# Patient Record
Sex: Male | Born: 1970 | Race: White | Hispanic: No | Marital: Single | Smoking: Current every day smoker
Health system: Southern US, Community
[De-identification: ages and names within clinical notes are randomized; demographics above are authoritative.]

## PROBLEM LIST (undated history)

## (undated) DIAGNOSIS — F419 Anxiety disorder, unspecified: Secondary | ICD-10-CM

## (undated) DIAGNOSIS — F32A Depression, unspecified: Secondary | ICD-10-CM

## (undated) DIAGNOSIS — K219 Gastro-esophageal reflux disease without esophagitis: Secondary | ICD-10-CM

## (undated) DIAGNOSIS — F319 Bipolar disorder, unspecified: Secondary | ICD-10-CM

## (undated) DIAGNOSIS — I2510 Atherosclerotic heart disease of native coronary artery without angina pectoris: Secondary | ICD-10-CM

## (undated) DIAGNOSIS — R0600 Dyspnea, unspecified: Secondary | ICD-10-CM

## (undated) DIAGNOSIS — F329 Major depressive disorder, single episode, unspecified: Secondary | ICD-10-CM

## (undated) HISTORY — DX: Gastro-esophageal reflux disease without esophagitis: K21.9

## (undated) HISTORY — DX: Bipolar disorder, unspecified: F31.9

## (undated) HISTORY — PX: FOOT SURGERY: SHX648

## (undated) HISTORY — PX: NO PAST SURGERIES: SHX2092

---

## 1997-11-26 ENCOUNTER — Emergency Department (HOSPITAL_COMMUNITY): Admission: EM | Admit: 1997-11-26 | Discharge: 1997-11-26 | Payer: Self-pay | Admitting: Emergency Medicine

## 2000-03-23 ENCOUNTER — Encounter: Admission: RE | Admit: 2000-03-23 | Discharge: 2000-03-23 | Payer: Self-pay | Admitting: Family Medicine

## 2001-05-01 ENCOUNTER — Encounter: Payer: Self-pay | Admitting: Sports Medicine

## 2001-05-01 ENCOUNTER — Encounter: Admission: RE | Admit: 2001-05-01 | Discharge: 2001-05-01 | Payer: Self-pay | Admitting: Sports Medicine

## 2001-05-01 ENCOUNTER — Encounter: Admission: RE | Admit: 2001-05-01 | Discharge: 2001-05-01 | Payer: Self-pay | Admitting: Family Medicine

## 2001-05-03 ENCOUNTER — Encounter: Admission: RE | Admit: 2001-05-03 | Discharge: 2001-05-03 | Payer: Self-pay | Admitting: Family Medicine

## 2001-05-07 ENCOUNTER — Encounter: Admission: RE | Admit: 2001-05-07 | Discharge: 2001-05-07 | Payer: Self-pay | Admitting: Sports Medicine

## 2001-06-03 ENCOUNTER — Encounter: Payer: Self-pay | Admitting: Emergency Medicine

## 2001-06-03 ENCOUNTER — Emergency Department (HOSPITAL_COMMUNITY): Admission: EM | Admit: 2001-06-03 | Discharge: 2001-06-04 | Payer: Self-pay | Admitting: Emergency Medicine

## 2002-02-20 ENCOUNTER — Emergency Department (HOSPITAL_COMMUNITY): Admission: EM | Admit: 2002-02-20 | Discharge: 2002-02-20 | Payer: Self-pay | Admitting: *Deleted

## 2004-05-19 ENCOUNTER — Emergency Department (HOSPITAL_COMMUNITY): Admission: EM | Admit: 2004-05-19 | Discharge: 2004-05-19 | Payer: Self-pay | Admitting: Emergency Medicine

## 2006-02-13 ENCOUNTER — Emergency Department (HOSPITAL_COMMUNITY): Admission: EM | Admit: 2006-02-13 | Discharge: 2006-02-13 | Payer: Self-pay | Admitting: Family Medicine

## 2006-02-16 ENCOUNTER — Emergency Department (HOSPITAL_COMMUNITY): Admission: EM | Admit: 2006-02-16 | Discharge: 2006-02-16 | Payer: Self-pay | Admitting: Family Medicine

## 2006-08-07 ENCOUNTER — Emergency Department (HOSPITAL_COMMUNITY): Admission: EM | Admit: 2006-08-07 | Discharge: 2006-08-07 | Payer: Self-pay | Admitting: Family Medicine

## 2006-09-19 ENCOUNTER — Emergency Department (HOSPITAL_COMMUNITY): Admission: EM | Admit: 2006-09-19 | Discharge: 2006-09-19 | Payer: Self-pay | Admitting: Emergency Medicine

## 2007-01-15 ENCOUNTER — Emergency Department (HOSPITAL_COMMUNITY): Admission: EM | Admit: 2007-01-15 | Discharge: 2007-01-15 | Payer: Self-pay | Admitting: Emergency Medicine

## 2008-04-12 ENCOUNTER — Inpatient Hospital Stay (HOSPITAL_COMMUNITY): Admission: EM | Admit: 2008-04-12 | Discharge: 2008-04-17 | Payer: Self-pay | Admitting: Family Medicine

## 2009-08-07 ENCOUNTER — Emergency Department (HOSPITAL_COMMUNITY): Admission: EM | Admit: 2009-08-07 | Discharge: 2009-08-07 | Payer: Self-pay | Admitting: Family Medicine

## 2010-05-29 ENCOUNTER — Emergency Department (HOSPITAL_COMMUNITY)
Admission: EM | Admit: 2010-05-29 | Discharge: 2010-05-29 | Payer: Self-pay | Source: Home / Self Care | Admitting: Emergency Medicine

## 2010-05-29 ENCOUNTER — Inpatient Hospital Stay (HOSPITAL_COMMUNITY)
Admission: AD | Admit: 2010-05-29 | Discharge: 2010-06-01 | Payer: Self-pay | Source: Home / Self Care | Attending: Psychiatry | Admitting: Psychiatry

## 2010-08-31 LAB — CBC
HCT: 42.9 % (ref 39.0–52.0)
Hemoglobin: 14.7 g/dL (ref 13.0–17.0)
MCH: 32.2 pg (ref 26.0–34.0)
MCHC: 34.3 g/dL (ref 30.0–36.0)
MCV: 94.1 fL (ref 78.0–100.0)
Platelets: 328 K/uL (ref 150–400)
RBC: 4.56 MIL/uL (ref 4.22–5.81)
RDW: 13 % (ref 11.5–15.5)
WBC: 9.2 K/uL (ref 4.0–10.5)

## 2010-08-31 LAB — BASIC METABOLIC PANEL
BUN: 6 mg/dL (ref 6–23)
CO2: 22 mEq/L (ref 19–32)
Calcium: 9.1 mg/dL (ref 8.4–10.5)
Chloride: 109 mEq/L (ref 96–112)
Creatinine, Ser: 0.6 mg/dL (ref 0.4–1.5)
GFR calc Af Amer: >60 mL/min (ref >60)
GFR calc non Af Amer: >60 mL/min (ref >60)
Glucose, Bld: 84 mg/dL (ref 70–99)
Potassium: 4.3 mEq/L (ref 3.5–5.1)
Sodium: 140 mEq/L (ref 135–145)

## 2010-08-31 LAB — DIFFERENTIAL
Basophils Absolute: 0.1 10*3/uL (ref 0.0–0.1)
Basophils Relative: 1 % (ref 0–1)
Eosinophils Absolute: 0.3 10*3/uL (ref 0.0–0.7)
Eosinophils Relative: 3 % (ref 0–5)
Lymphocytes Relative: 27 % (ref 12–46)
Lymphs Abs: 2.5 10*3/uL (ref 0.7–4.0)
Monocytes Absolute: 0.6 10*3/uL (ref 0.1–1.0)
Monocytes Relative: 7 % (ref 3–12)
Neutro Abs: 5.8 10*3/uL (ref 1.7–7.7)
Neutrophils Relative %: 63 % (ref 43–77)

## 2010-08-31 LAB — TRICYCLICS SCREEN, URINE: TCA Scrn: NOT DETECTED

## 2010-08-31 LAB — RAPID URINE DRUG SCREEN, HOSP PERFORMED
Amphetamines: NOT DETECTED
Barbiturates: NOT DETECTED
Benzodiazepines: NOT DETECTED
Cocaine: NOT DETECTED
Opiates: NOT DETECTED
Tetrahydrocannabinol: NOT DETECTED

## 2010-08-31 LAB — ETHANOL: Alcohol, Ethyl (B): 33 mg/dL — ABNORMAL HIGH (ref 0–10)

## 2010-08-31 LAB — ACETAMINOPHEN LEVEL

## 2010-11-02 NOTE — H&P (Signed)
NAMELERRY, CORDREY NO.:  [id number]   MEDICAL RECORD NO.:  [id number]          PATIENT TYPE:  INP   LOCATION:  2921                         FACILITY:  [facility]   PHYSICIAN:  Vania Rea, M.D. DATE OF BIRTH:  [birth year]   DATE OF ADMISSION:  04/12/2008  DATE OF DISCHARGE:                              HISTORY & PHYSICAL   CHIEF COMPLAINT:  Fever, cough, and body aches of five days.   HISTORY OF PRESENT ILLNESS:  This is a 40 year old Caucasian gentleman  whose wife is recovering from flu, who has for the past five days been  having fever, cough, generalized body aches, has been self-medicating  with NyQuil and Rolaids without good effect and eventually decided to  come to the emergency room.  His wife contracted flu despite having  previously taken the flu shot and is now slowly recovering.   The patient has had chest pain only with coughing.  He has had no nausea  or vomiting.  He has had no syncope.   The patient does smoke two packs per day and does drink 6-12 beers per  day.  He also gives a history of delirium tremens if he does not drink.  The patient also reports the appearance of pustule on his left buttock  which his wife has tried to drain but she says it has recurred.  She  reports that it did not look vesicular when it first appeared.  It just  had a small head.   PAST MEDICAL HISTORY:  None.   MEDICATIONS:  Over the counter NyQuil and Rolaids.   ALLERGIES:  PENICILLIN causes hives and a rash.  He once took a pain  medication that caused severe nausea and stomach upset but he cannot  remember the name of it.   SOCIAL HISTORY:  As noted above.  He smokes two packs per day, drinks 6-  12 beers per day.  He occasionally uses marijuana.  No history of  cocaine use.  He is an unemployed Corporate investment banker.   FAMILY HISTORY:  Significant for heart disease, hypertension, and  hyperlipidemia in his father.  No other significant family medical  history.   REVIEW OF SYSTEMS:  Other than noted above, significant only for dysuria  with no frequency.   PHYSICAL EXAMINATION:  GENERAL:  Obese, young Caucasian gentleman lying  on a stretcher appears rather ill-looking, coughing frequently., is  restless.  VITAL SIGNS:  His temperature is 99.8, his pulse is 110, respirations  22, blood pressure 125/75.  He is saturating at 91% on 2 L.  He was 87%  on room air.  HEENT:  His pupils are round and equal.  Mucous membranes pink and  anicteric.  He is mildly dehydrated.  NECK:  No cervical lymphadenopathy or thyromegaly.  CHEST:  Actually sounds quite clear to auscultation.  CARDIOVASCULAR:  Tachycardic without murmurs.  ABDOMEN:  Obese, soft, and nontender.  EXTREMITIES:  Without edema.  He has 2+ bounding pulses bilaterally.  His peroneal area has 4-inch area around his right medial buttock which  is indurated with a  0.5 cm opening covered by a dry scab.  GENITOURINARY:  His penis shows no urethral discharge and no evidence of  urethral inflammation.  CENTRAL NERVOUS SYSTEM:  Cranial nerves II-XII are grossly intact and he  has no focal neurologic deficits.   LABORATORY DATA:  His white count is 14.4, hemoglobin 15.2, platelets  243.  His absolute neutrophil count is 11.7.  Serum chemistry:  Sodium  is 131, potassium 3.7, chloride 94, CO2 32, BUN 4, creatinine 0.9, his  glucose is 119.   His chest x-ray shows patchy bilateral air space disease.   ASSESSMENT:  1. Viral syndrome.  2. Bilateral pneumonia, likely complication of the viral syndrome.  3. Right buttock abscess.  4. Tobacco abuse/addiction.  5. Chronic alcoholism.  6. Acute respiratory failure.   PLAN:  1. Will admit this gentleman to start on IV fluids, oxygen      supplementation, antibiotics, and will cover him with vancomycin      since he does have a past history of MRSA.  Will consult surgery      for assistance with right buttock abscess.  2. Nicotine patch  for his nicotine addiction.  He has been counseled      on the dangers of nicotine abuse, and we will put him on DT      prevention.  Other plans as per orders.      Vania Rea, M.D.  Electronically Signed     LC/MEDQ  D:  04/12/2008  T:  04/12/2008  Job:  [id number]

## 2010-11-02 NOTE — Consult Note (Signed)
Dustin Olsen, Dustin Olsen                  ACCOUNT NO.:  [id number]   MEDICAL RECORD NO.:  [id number]          PATIENT TYPE:  INP   LOCATION:  1826                         FACILITY:  [facility]   PHYSICIAN:  Clovis Pu. Olsen, M.D.DATE OF BIRTH:  1970/11/17   DATE OF CONSULTATION:  DATE OF DISCHARGE:                                 CONSULTATION   REFERRING PHYSICIAN:  Dr. Orvan Falconer from [facility].   CHIEF COMPLAINT:  Right buttock redness, pain, and swelling.   HISTORY OF PRESENT ILLNESS:  The patient is a 40 year old male with a 1-  week history of pain in his right buttock.  According to his significant  other who is with him tonight, she stated that his right buttock has  been hurting for about a week.  It has been red, it became more swollen,  and then began to drain.  The drain has been pretty continuous.  He has  also developed cough, headache, and according to his girlfriend, fever  and chills over the last week.  Chest x-ray revealed bilateral airspace  opacities, and there is concern that he may have pneumonia with flu-like  symptoms.  He is being admitted due to what appears to be hypoxemia on  his blood gas and pneumonia and I was asked to see the patient at the  request of Dr. Orvan Falconer in consultation for the right buttock swelling  and pain.   PAST MEDICAL HISTORY:  1. Tobacco abuse, he smokes 2-pack cigarettes a day.  2. Chronic back pain.   PAST SURGICAL HISTORY:  None.   SOCIAL HISTORY:  Does smoke 2-pack cigarettes a day.  Denies alcohol  use.   FAMILY HISTORY:  Noncontributory.   MEDICATIONS:  NyQuil and Rolaids.   REVIEW OF SYSTEMS:  Seven systems reviewed, otherwise negative beyond  that stated above.   PHYSICAL EXAMINATION:  VITAL SIGNS:  Temperature 98, heart rate 110,  blood pressure 125/75, and O2 saturation to 94%.  HEENT:  There is no evidence of scleral icterus.  Normocephalic and  atraumatic.  EXTREMITIES:  Muscle tone is normal.  No evidence of edema.   Right  buttock shows an open area measuring a centimeter over the mid right  buttock with a surrounding rim of erythema and fluctuance.  It is  tender.   DIAGNOSTIC STUDIES:  White count of 14,000, hemoglobin 15.2, and  platelet count is 243,000.  Sodium 131, potassium 3.7, chloride 94, BUN  4, and creatinine 0.9.  Blood gas 7.46, PCO2 of 40, O2 68, and 95%  saturation.  Chest x-ray shows bilateral airspace opacities.   IMPRESSION:  1. Right buttock abscess, partially drained.  2. Probable pneumonia.  3. Flu-like symptoms.   PLAN:  This needs to be opened, and certainly, it can be done here at  the bedside.  It is already partially open and I talked to the patient  about this, and he agreed to proceed with incision and drainage at the  bedside of right buttock abscess.  Under sterile condition, the right  buttock was prepped in sterile fashion.  I used 1% lidocaine  for  anesthesia.  The previous opening was in use and I placed a hemostat and  dilated this up and got a considerable amount of pus out.  I then packed  the wound with quarter-inch iodoform packing.  Cultures were taken.  He  tolerated the procedure well.  We will follow him while in the hospital.      Dustin Olsen, M.D.  Electronically Signed     TAC/MEDQ  D:  04/12/2008  T:  04/13/2008  Job:  [id number]

## 2010-11-05 NOTE — Discharge Summary (Signed)
Dustin Olsen, Dustin Olsen NO.:  [id number]   MEDICAL RECORD NO.:  [id number]          PATIENT TYPE:  INP   LOCATION:  3003                         FACILITY:  [facility]   PHYSICIAN:  Marcellus Scott, MD     DATE OF BIRTH:  March 15, 1971   DATE OF ADMISSION:  04/12/2008  DATE OF DISCHARGE:  04/17/2008                               DISCHARGE SUMMARY   PRIMARY MEDICAL DOCTOR:  At [facility].   DISCHARGE DIAGNOSES:  1. Methicillin-resistant Staphylococcus aureus gluteal abscess.  2. Bilateral pneumonia.  3. Acute respiratory failure - resolved.  4. Tobacco abuse.  5. Alcoholism.  6. Flu-like illness.   DISCHARGE MEDICATIONS:  1. Doxycycline 100 mg p.o. b.i.d. for 10 days.  2. Nicotine 21 mg per 24-hour patch, 1 daily for 5 weeks, then taper.  3. Thiamine 100 mg p.o. daily.  4. Folate 1 mg p.o. daily.  5. Multivitamins 1 p.o. daily.  6. Oxycodone 5 mg p.o. q.4-6 hourly p.r.n.   PROCEDURES:  1. Chest x-ray on April 12, 2008.  Impression:  Cardiomegaly.      Patchy bilateral opacities peripherally in the lungs.  In the      patient with cough, this probably represents infection.  Empiric      treatment with followup chest x-ray was recommended by radiologist.      If no improvement was observed, then recommend a chest CT.  They      also indicated that interstitial lung disease could produce this      appearance.   PERTINENT LABORATORIES:  Hemoglobin A1c 5.9.  Wound culture with  moderate methicillin-resistant Staphylococcus aureus, which was  sensitive to clindamycin, gentamicin, levofloxacin, rifampin, Bactrim,  vancomycin, and tetracycline.  They was resistant to erythromycin,  oxacillin, and penicillin.   Basic metabolic panel unremarkable with BUN 2 and creatinine 0.74.  CBCs  with hemoglobin 13.2, hematocrit 38, white blood cell 9.1, and platelets  501.  ABG on April 13, 2008, with pH 7.471, pCO2 of 37.2, pO2 of 75.3,  and oxygen saturation 95.7.   Hepatic panel only remarkable for AST of  54, ALT of 45, and albumin of 2.9.  ABG on April 12, 2008, had pH of  7.47, pCO2 of 41, pO2 of 68, bicarb 30, and oxygen saturation 95.  BNP  of 104.   CONSULTATIONS:  Surgery, Thomas A. Cornett, MD   HOSPITAL COURSE AND PATIENT DISPOSITION:  Mr. Luckadoo is a 40 year old  Caucasian male patient whose wife was recovering from the flu, who for  the 5 days prior to admission, was having fever, cough, generalized body  aches, and had been self-medicating himself with NyQuil and Rolaids with  good effect and eventually decided to come to the ED.  The patient had  some chest pain only on coughing.  He was an ongoing smoker and drank 6-  12 beers per day.  He also gave history of withdrawal if he did not  drink.  He reported a pustule on his left buttock, which his wife had  tried to drain, but it had recurred.  In  the emergency room, the patient  was ill-looking with hacking cough and low-grade temperature of 99.8,  tachycardic at 110 per minute, tachypneic at 22 per minute, saturating  91% on 2 L.  He was 87% on room air.  He was found to have white blood  cell of 14.4.  Chest x-ray suggestive of bilateral airspace disease.  He  was then admitted as a viral syndrome, bilateral pneumonia, and right  buttock abscess.   1. MRSA gluteal abscess.  The patient was admitted to the hospital.      Surgery was consulted.  They proceeded to incise and drain the      right buttock abscess.  He was empirically placed on IV vancomycin      and Levaquin.  Subsequently, the pain in the right buttock area was      improving.  Daily dressings were continued.  Subsequently, the      surgeons adviced that he could be discharged home to complete 7- to      10-day course of antibiotics and wound care and followup with the      surgeons in 7-10 days upon discharge.  2. Bilateral pneumonia.  The patient was started on Levaquin.  His      acute transient hypoxic respiratory  failure improved.  He completed      the course of Levaquin.  The patient was placed on droplet and      contact isolation.  3. Acute respiratory failure - resolved.  4. Tobacco abuse - the patient was counseled regarding cessation and      was discharged on a nicotine patch.  5. Alcoholism.  The patient was placed on withdrawal protocol.  He did      not demonstrate any overt signs of withdrawal.  Librium was      subsequently discontinued and the patient was discharged on      multivitamins.  6. Flu-like illness - was treated and resolved.      Marcellus Scott, MD  Electronically Signed     AH/MEDQ  D:  05/29/2008  T:  05/29/2008  Job:  [id number]   cc:   Maisie Fus A. Cornett, M.D.  [facility]

## 2011-03-21 LAB — POCT I-STAT, CHEM 8
BUN: 4 — ABNORMAL LOW
Calcium, Ion: 0.99 — ABNORMAL LOW
Chloride: 94 — ABNORMAL LOW
Creatinine, Ser: 0.9
Glucose, Bld: 119 — ABNORMAL HIGH
HCT: 46
Hemoglobin: 15.6
Potassium: 3.7
Sodium: 131 — ABNORMAL LOW
TCO2: 32

## 2011-03-21 LAB — CBC
HCT: 38 — ABNORMAL LOW
HCT: 39.1
HCT: 39.8
HCT: 45.9
Hemoglobin: 13.2
Hemoglobin: 13.4
Hemoglobin: 13.7
Hemoglobin: 15.2
MCHC: 33.2
MCHC: 33.7
MCHC: 34.7
MCHC: 35
MCV: 90.8
MCV: 92.2
MCV: 93.4
MCV: 93.5
Platelets: 243
Platelets: 288
Platelets: 355
Platelets: 501 — ABNORMAL HIGH
RBC: 4.12 — ABNORMAL LOW
RBC: 4.25
RBC: 4.31
RBC: 4.91
RDW: 13.5
RDW: 13.6
RDW: 13.6
RDW: 13.8
WBC: 11.3 — ABNORMAL HIGH
WBC: 13.8 — ABNORMAL HIGH
WBC: 14.4 — ABNORMAL HIGH
WBC: 9.1

## 2011-03-21 LAB — POCT I-STAT 3, ART BLOOD GAS (G3+)
Acid-Base Excess: 5 — ABNORMAL HIGH
Bicarbonate: 29.6 — ABNORMAL HIGH
O2 Saturation: 95
Patient temperature: 98.1
TCO2: 31
pCO2 arterial: 40.8
pH, Arterial: 7.468 — ABNORMAL HIGH
pO2, Arterial: 68 — ABNORMAL LOW

## 2011-03-21 LAB — BASIC METABOLIC PANEL
BUN: 2 — ABNORMAL LOW
BUN: 2 — ABNORMAL LOW
BUN: 3 — ABNORMAL LOW
CO2: 27
CO2: 28
CO2: 28
Calcium: 7.9 — ABNORMAL LOW
Calcium: 8.1 — ABNORMAL LOW
Calcium: 8.1 — ABNORMAL LOW
Chloride: 100
Chloride: 104
Chloride: 104
Creatinine, Ser: 0.65
Creatinine, Ser: 0.68
Creatinine, Ser: 0.74
GFR calc Af Amer: >60
GFR calc Af Amer: >60
GFR calc Af Amer: >60
GFR calc non Af Amer: >60
GFR calc non Af Amer: >60
GFR calc non Af Amer: >60
Glucose, Bld: 104 — ABNORMAL HIGH
Glucose, Bld: 110 — ABNORMAL HIGH
Glucose, Bld: 125 — ABNORMAL HIGH
Potassium: 3.4 — ABNORMAL LOW
Potassium: 3.5
Potassium: 4
Sodium: 135
Sodium: 137
Sodium: 138

## 2011-03-21 LAB — HEPATIC FUNCTION PANEL
ALT: 45
AST: 54 — ABNORMAL HIGH
Albumin: 2.9 — ABNORMAL LOW
Alkaline Phosphatase: 67
Bilirubin, Direct: 0.3
Indirect Bilirubin: 0.6
Total Bilirubin: 0.9
Total Protein: 6.5

## 2011-03-21 LAB — BLOOD GAS, ARTERIAL
Acid-Base Excess: 3.2 — ABNORMAL HIGH
Bicarbonate: 26.6 — ABNORMAL HIGH
Drawn by: 251151
O2 Content: 4
O2 Saturation: 95.7
Patient temperature: 99.8
TCO2: 27.7
pCO2 arterial: 37.2
pH, Arterial: 7.471 — ABNORMAL HIGH
pO2, Arterial: 75.3 — ABNORMAL LOW

## 2011-03-21 LAB — GLUCOSE, CAPILLARY
Glucose-Capillary: 118 — ABNORMAL HIGH
Glucose-Capillary: 246 — ABNORMAL HIGH

## 2011-03-21 LAB — DIFFERENTIAL
Basophils Absolute: 0
Basophils Relative: 0
Eosinophils Absolute: 0
Eosinophils Relative: 0
Lymphocytes Relative: 8 — ABNORMAL LOW
Lymphs Abs: 1.1
Monocytes Absolute: 1.6 — ABNORMAL HIGH
Monocytes Relative: 11
Neutro Abs: 11.7 — ABNORMAL HIGH
Neutrophils Relative %: 81 — ABNORMAL HIGH

## 2011-03-21 LAB — HEMOGLOBIN A1C
Hgb A1c MFr Bld: 5.9
Mean Plasma Glucose: 123

## 2011-03-21 LAB — WOUND CULTURE

## 2011-03-21 LAB — B-NATRIURETIC PEPTIDE (CONVERTED LAB): Pro B Natriuretic peptide (BNP): 104 — ABNORMAL HIGH

## 2011-03-21 LAB — MAGNESIUM: Magnesium: 2.6 — ABNORMAL HIGH

## 2011-03-21 LAB — VANCOMYCIN, TROUGH: Vancomycin Tr: 15.8

## 2011-04-04 LAB — POCT URINALYSIS DIP (DEVICE)
Glucose, UA: NEGATIVE
Hgb urine dipstick: NEGATIVE
Ketones, ur: NEGATIVE
Nitrite: NEGATIVE
Operator id: 239701
Protein, ur: NEGATIVE
Specific Gravity, Urine: >=1.03
Urobilinogen, UA: 0.2
pH: 5.5

## 2011-12-19 ENCOUNTER — Emergency Department (INDEPENDENT_AMBULATORY_CARE_PROVIDER_SITE_OTHER)
Admission: EM | Admit: 2011-12-19 | Discharge: 2011-12-19 | Disposition: A | Discharge status: Home / Self Care | Payer: Self-pay | Source: Home / Self Care | Attending: Family Medicine | Admitting: Family Medicine

## 2011-12-19 ENCOUNTER — Encounter (HOSPITAL_COMMUNITY): Payer: Self-pay

## 2011-12-19 DIAGNOSIS — K089 Disorder of teeth and supporting structures, unspecified: Secondary | ICD-10-CM

## 2011-12-19 DIAGNOSIS — K047 Periapical abscess without sinus: Secondary | ICD-10-CM

## 2011-12-19 DIAGNOSIS — K0889 Other specified disorders of teeth and supporting structures: Secondary | ICD-10-CM

## 2011-12-19 MED ORDER — IBUPROFEN 600 MG PO TABS: 600.0000 mg | ORAL_TABLET | Freq: Three times a day (TID) | ORAL | Status: AC | PRN

## 2011-12-19 MED ORDER — HYDROCODONE-ACETAMINOPHEN 5-500 MG PO TABS: 1.0000 | ORAL_TABLET | Freq: Three times a day (TID) | ORAL | Status: AC | PRN

## 2011-12-19 MED ORDER — CLINDAMYCIN HCL 300 MG PO CAPS: 300.0000 mg | ORAL_CAPSULE | Freq: Three times a day (TID) | ORAL | Status: AC

## 2011-12-19 NOTE — Discharge Instructions (Signed)
You need to see a dentist for definitive treatment. Take the prescribed medications as instructed. Be aware that Vicodin can make you drowsy and he should not drive after taking this medication. Call the dentist on call number provided above or see the list below to find a dentist.    Go to www.goodrx.com to look up your medications. This will give you a list of where you can find your prescriptions at the most affordable prices.   RESOURCE GUIDE  Dental Problems  Look up DebtSupply.pl.asp for a schedule of the [facility]'s free dental clinics called [address] of [place]. They have clinics all around [place]. Get there early and be prepared to wait.   [facility] [address]  [place], [place] [address] [phone]  [facility] [address] [place], [place] [phone]  Patients with Medicaid: [facility] [phone] [address]                                [phone] [address] Phone:  [phone]                                                  Phone:  [phone]  If unable to pay or uninsured, contact:  [facility] or [facility]. to become qualified for the adult dental clinic.

## 2011-12-19 NOTE — ED Notes (Signed)
C/o dental pain-states there are 2 teeth on the rt upper.  Sx for 4 days.  Taking ibuprofen without relief.  Doesn't have a Education officer, community.

## 2011-12-21 NOTE — ED Provider Notes (Signed)
History     CSN: [id number]  Arrival date & time 12/19/11  1208   First MD Initiated Contact with Patient 12/19/11 1319      Chief Complaint  Patient presents with  . Dental Pain    (Consider location/radiation/quality/duration/timing/severity/associated sxs/prior treatment) HPI Comments: 41 y/o smoker, non diabetic male. Here c/o dental pain and gum swelling for 4 days at right upper maxilla. Denies fever or chills, no face swelling.    History reviewed. No pertinent past medical history.  History reviewed. No pertinent past surgical history.  No family history on file.  History  Substance Use Topics  . Smoking status: Current Everyday Smoker -- 1.0 packs/day  . Smokeless tobacco: Not on file  . Alcohol Use: Yes      Review of Systems  Constitutional: Negative for fever and chills.       10 systems reviewed and  pertinent negative and positive symptoms are as per HPI.     HENT: Positive for dental problem. Negative for ear pain, congestion, facial swelling, rhinorrhea, neck pain and sinus pressure.   Respiratory: Negative for shortness of breath.   Cardiovascular: Negative for chest pain, palpitations and leg swelling.  Gastrointestinal: Negative for nausea, vomiting and abdominal pain.  Skin: Negative for rash.  Neurological: Negative for dizziness and headaches.  All other systems reviewed and are negative.    Allergies  Penicillins  Home Medications   Current Outpatient Rx  Name Route Sig Dispense Refill  . CLINDAMYCIN HCL 300 MG PO CAPS Oral Take 1 capsule (300 mg total) by mouth 3 (three) times daily. 21 capsule 0  . HYDROCODONE-ACETAMINOPHEN 5-500 MG PO TABS Oral Take 1 tablet by mouth every 8 (eight) hours as needed for pain. 10 tablet 0  . IBUPROFEN 600 MG PO TABS Oral Take 1 tablet (600 mg total) by mouth every 8 (eight) hours as needed for pain. 20 tablet 0    BP 138/86  Pulse 80  Temp 98.5 F (36.9 C) (Oral)  Resp 18  SpO2 99%  Physical  Exam  Nursing note and vitals reviewed. Constitutional: He is oriented to person, place, and time. He appears well-developed and well-nourished. No distress.  HENT:  Head: Normocephalic and atraumatic.  Right Ear: External ear normal.  Left Ear: External ear normal.  Nose: Nose normal.  Mouth/Throat: Oropharynx is clear and moist. No oropharyngeal exudate.       Poor dentition with extensive dental plaque and periodontal disease.   Several fractured pieces. Tenderness to palpation and periapical swelling and yellow exudate around right upper molars.   Cardiovascular: Normal heart sounds.   No murmur heard. Pulmonary/Chest: Breath sounds normal.  Neurological: He is alert and oriented to person, place, and time.  Skin: No rash noted.    ED Course  Procedures (including critical care time)  Labs Reviewed - No data to display No results found.   1. Pain, dental   2. Dental abscess       MDM  Allergic to penicillin, treated with clindamycin, Vicodin and ibuprofen. Dental referral provided.         Sharin Grave, MD 12/21/11 1128

## 2012-04-25 ENCOUNTER — Encounter (HOSPITAL_COMMUNITY): Payer: Self-pay | Admitting: Emergency Medicine

## 2012-04-25 ENCOUNTER — Emergency Department (HOSPITAL_COMMUNITY): Payer: Self-pay

## 2012-04-25 ENCOUNTER — Emergency Department (INDEPENDENT_AMBULATORY_CARE_PROVIDER_SITE_OTHER): Payer: Self-pay

## 2012-04-25 ENCOUNTER — Emergency Department (INDEPENDENT_AMBULATORY_CARE_PROVIDER_SITE_OTHER)
Admission: EM | Admit: 2012-04-25 | Discharge: 2012-04-25 | Disposition: A | Discharge status: Nursing Home (Includes ICF) | Payer: Self-pay | Source: Home / Self Care | Attending: Emergency Medicine | Admitting: Emergency Medicine

## 2012-04-25 ENCOUNTER — Encounter (HOSPITAL_COMMUNITY): Payer: Self-pay

## 2012-04-25 ENCOUNTER — Emergency Department (HOSPITAL_COMMUNITY)
Admission: EM | Admit: 2012-04-25 | Discharge: 2012-04-25 | Disposition: A | Discharge status: Home / Self Care | Payer: Self-pay | Attending: Emergency Medicine | Admitting: Emergency Medicine

## 2012-04-25 DIAGNOSIS — Z8659 Personal history of other mental and behavioral disorders: Secondary | ICD-10-CM | POA: Insufficient documentation

## 2012-04-25 DIAGNOSIS — R0789 Other chest pain: Secondary | ICD-10-CM | POA: Insufficient documentation

## 2012-04-25 DIAGNOSIS — Z7982 Long term (current) use of aspirin: Secondary | ICD-10-CM | POA: Insufficient documentation

## 2012-04-25 DIAGNOSIS — R748 Abnormal levels of other serum enzymes: Secondary | ICD-10-CM | POA: Insufficient documentation

## 2012-04-25 DIAGNOSIS — R079 Chest pain, unspecified: Secondary | ICD-10-CM

## 2012-04-25 DIAGNOSIS — F17200 Nicotine dependence, unspecified, uncomplicated: Secondary | ICD-10-CM | POA: Insufficient documentation

## 2012-04-25 DIAGNOSIS — K219 Gastro-esophageal reflux disease without esophagitis: Secondary | ICD-10-CM | POA: Insufficient documentation

## 2012-04-25 HISTORY — DX: Major depressive disorder, single episode, unspecified: F32.9

## 2012-04-25 HISTORY — DX: Depression, unspecified: F32.A

## 2012-04-25 LAB — COMPREHENSIVE METABOLIC PANEL
ALT: 318 U/L — ABNORMAL HIGH (ref 0–53)
AST: 247 U/L — ABNORMAL HIGH (ref 0–37)
Albumin: 4 g/dL (ref 3.5–5.2)
Alkaline Phosphatase: 96 U/L (ref 39–117)
BUN: 6 mg/dL (ref 6–23)
CO2: 24 mEq/L (ref 19–32)
Calcium: 9.5 mg/dL (ref 8.4–10.5)
Chloride: 97 mEq/L (ref 96–112)
Creatinine, Ser: 0.61 mg/dL (ref 0.50–1.35)
GFR calc Af Amer: >90 mL/min (ref >90)
GFR calc non Af Amer: >90 mL/min (ref >90)
Glucose, Bld: 102 mg/dL — ABNORMAL HIGH (ref 70–99)
Potassium: 4.2 mEq/L (ref 3.5–5.1)
Sodium: 134 mEq/L — ABNORMAL LOW (ref 135–145)
Total Bilirubin: 1 mg/dL (ref 0.3–1.2)
Total Protein: 7.4 g/dL (ref 6.0–8.3)

## 2012-04-25 LAB — POCT I-STAT TROPONIN I: Troponin i, poc: 0.01 ng/mL (ref 0.00–0.08)

## 2012-04-25 LAB — APTT: aPTT: 25 seconds (ref 24–37)

## 2012-04-25 LAB — CBC
HCT: 45.4 % (ref 39.0–52.0)
Hemoglobin: 15.9 g/dL (ref 13.0–17.0)
MCH: 32.9 pg (ref 26.0–34.0)
MCHC: 35 g/dL (ref 30.0–36.0)
MCV: 94 fL (ref 78.0–100.0)
Platelets: 150 10*3/uL (ref 150–400)
RBC: 4.83 MIL/uL (ref 4.22–5.81)
RDW: 13.1 % (ref 11.5–15.5)
WBC: 6.7 10*3/uL (ref 4.0–10.5)

## 2012-04-25 LAB — PROTIME-INR
INR: 1.08 (ref 0.00–1.49)
Prothrombin Time: 13.9 seconds (ref 11.6–15.2)

## 2012-04-25 MED ORDER — TRAMADOL HCL 50 MG PO TABS: 50.0000 mg | ORAL_TABLET | Freq: Four times a day (QID) | ORAL | Status: DC | PRN

## 2012-04-25 MED ORDER — OMEPRAZOLE 20 MG PO CPDR: 20.0000 mg | DELAYED_RELEASE_CAPSULE | Freq: Every day | ORAL | Status: DC

## 2012-04-25 MED ORDER — ASPIRIN 81 MG PO CHEW
324.0000 mg | CHEWABLE_TABLET | Freq: Once | ORAL | Status: AC
  Administered 2012-04-25: 324 mg via ORAL
  Filled 2012-04-25: qty 4

## 2012-04-25 MED ORDER — SODIUM CHLORIDE 0.9 % IV SOLN
1000.0000 mL | INTRAVENOUS | Status: DC
  Administered 2012-04-25: 1000 mL via INTRAVENOUS

## 2012-04-25 MED ORDER — HYDROCODONE-ACETAMINOPHEN 5-325 MG PO TABS: 1.0000 | ORAL_TABLET | Freq: Four times a day (QID) | ORAL | Status: DC | PRN

## 2012-04-25 NOTE — ED Notes (Signed)
Was reaching for hammer in back of truck on Friday and hurt left side went to ucc this am and has rib fx

## 2012-04-25 NOTE — ED Notes (Addendum)
Patient c/o chest pain, states no nausea, no arm or jaw pain (states does have bad teeth and pain from teeth) , complains of chest pressure, no radiation, also complains of rib pain states that sx began Sunday

## 2012-04-25 NOTE — ED Provider Notes (Signed)
History     CSN: [id number]  Arrival date & time 04/25/12  4098   First MD Initiated Contact with Patient 04/25/12 [phone]      Chief Complaint  Patient presents with  . Chest Pain    (Consider location/radiation/quality/duration/timing/severity/associated sxs/prior treatment) HPI Comments: Patient presents urgent care complaining of chest pain which she describes different time frames is been having " intermittent substernal chest pains for the last 2 weeks", he then fell from his truck and describes that he thinks he cracked a couple of ribs on the left side of his chest that have been worsening in the last week, breathing and moving makes the pain worse. The patient further describes that he was having pain since (points to precordial and substernal region), prior to the fall. Has been feeling weak at times, and also feels that he might have bronchitis is having coughing and because of his pain. Patient denies any shortness of breath, no diaphoresis. He has not taken any medicines for his ongoing chest pains.   Patient is a 41 y.o. male presenting with chest pain. The history is provided by the patient.  Chest Pain The chest pain began more than 2 weeks ago. Chest pain occurs constantly. The chest pain is unchanged. The pain is associated with breathing, coughing and exertion. At its most intense, the pain is at 8/10. The pain is currently at 5/10. The quality of the pain is described as sharp and pressure-like. Primary symptoms include cough. Pertinent negatives for primary symptoms include no fever, no fatigue, no shortness of breath, no wheezing, no palpitations, no abdominal pain, no nausea, no dizziness and no altered mental status.  Associated symptoms include weakness.  Pertinent negatives for associated symptoms include no diaphoresis and no lower extremity edema. He tried nothing for the symptoms. There are no known risk factors.     History reviewed. No pertinent past medical  history.  History reviewed. No pertinent past surgical history.  No family history on file.  History  Substance Use Topics  . Smoking status: Current Every Day Smoker -- 1.0 packs/day  . Smokeless tobacco: Not on file  . Alcohol Use: Yes      Review of Systems  Constitutional: Positive for activity change. Negative for fever, chills, diaphoresis, appetite change and fatigue.  Respiratory: Positive for cough. Negative for shortness of breath and wheezing.   Cardiovascular: Positive for chest pain. Negative for palpitations and leg swelling.  Gastrointestinal: Negative for nausea and abdominal pain.  Musculoskeletal: Negative for back pain.  Skin: Negative for rash.  Neurological: Positive for weakness. Negative for dizziness and headaches.  Psychiatric/Behavioral: Negative for altered mental status.    Allergies  Penicillins  Home Medications  No current outpatient prescriptions on file.  BP 143/89  Pulse 81  Temp 98.7 F (37.1 C) (Oral)  Resp 18  SpO2 100%  Physical Exam  Nursing note and vitals reviewed. Constitutional: Vital signs are normal. He appears well-developed and well-nourished.  Non-toxic appearance. He does not have a sickly appearance. He does not appear ill. No distress.  HENT:  Head: Normocephalic.  Neck: Neck supple. No JVD present.  Cardiovascular: Normal rate.  Exam reveals no gallop and no friction rub.   No murmur heard. Pulmonary/Chest: Effort normal and breath sounds normal. No respiratory distress. He has no decreased breath sounds. He has no wheezes. He has no rales. He exhibits tenderness.  Abdominal: Soft.  Musculoskeletal: Normal range of motion.  Lymphadenopathy:    He has no  cervical adenopathy.  Neurological: He is alert.  Skin: No rash noted. No erythema.    ED Course  Procedures (including critical care time)  Labs Reviewed - No data to display Dg Chest 2 View  04/25/2012  *RADIOLOGY REPORT*  Clinical Data: Chest pain.   CHEST - 2 VIEW  Comparison: April 12, 2008.  Findings: Cardiomediastinal silhouette appears normal.  No acute pulmonary disease is noted.  Bony thorax is intact.  IMPRESSION: No acute cardiopulmonary abnormality seen.   Original Report Authenticated By: Lupita Raider.,  M.D.    Dg Ribs Unilateral Left  04/25/2012  *RADIOLOGY REPORT*  Clinical Data: Injured last Friday, left anterior lower rib pain  LEFT RIBS - 2 VIEW  Comparison: None.  Findings: On one-view, there does appear to be a fracture of the anterior left seventh rib.  No other acute abnormality is seen.  No effusion is noted, and there is no evidence of pneumothorax on the images obtained.  IMPRESSION: Fracture of the anterior left seventh rib.   Original Report Authenticated By: Dwyane Dee, M.D.      1. Chest pain       MDM  Patient with atypical- substernal CP and and reporting a recent fall. Coexistent recent fall but also patient reports having chest pains prior to this fall. Initial EKG at urgent care revealed no ST T. changes to suggest an acute ischemia. Poor historian. We'll transfer patient to the emergency department to be consider for a single set of cardiac markers. Patient is hemodynamically stable, had x-rays and rib detail x-rays that revealed no obvious rib fractures.          Jimmie Molly, MD 04/25/12 [phone]

## 2012-04-25 NOTE — ED Notes (Addendum)
Pt reports last week on Wednesday he started having CP, reports he has had a cough x 2 weeks. CP at central chest, comes and goes but sometimes its constant. Pt reports he was coughing a lot on Sunday and the pain became worse. Pt reports he was on his truck when he fell and hurt his chest even worse.

## 2012-04-25 NOTE — ED Provider Notes (Addendum)
History   This chart was scribed for Celene Kras, MD by Albertha Ghee Rifaie. This patient was seen in room TR11C/TR11C and the patient's care was started at 12:52 PM.   CSN: [id number]  Arrival date & time 04/25/12  1034   First MD Initiated Contact with Patient 04/25/12 1128      No chief complaint on file.    The history is provided by the patient. No language interpreter was used.    Dustin Olsen is a 41 y.o. male who presents to the Emergency Department complaining of sternal chest pain onset 3 days ago and persistent cough onset 2 weeks ago. Patient describes pain as constant, gradually worsening, moderate, and pressure-like.  Pain is aggravated or relieved by nothing. There is associated mild SOB, and mild headache. He also complains of sharp left rib pain. He denies swelling, nausea, fever, acid reflux or chills. He denies any other chronic health problems. Patient has been taking BCs powder for pain with minimal relief.  He was referred by Dr. Ladon Applebaum from Urgent care for further cardiac testing.   Patient reports no relevant personal cardiac history or surgical history. Patient has a family history of MI ( father x3).  Patient is a current everyday smoker and he states some alcohol use.   Past Medical History  Diagnosis Date  . Depression    History  Substance Use Topics  . Smoking status: Current Every Day Smoker -- 1.0 packs/day  . Smokeless tobacco: Not on file  . Alcohol Use: Yes      Review of Systems A complete 10 system review of systems was obtained and all systems are negative except as noted in the HPI and PMH.    Allergies  Penicillins  Home Medications   Current Outpatient Rx  Name  Route  Sig  Dispense  Refill  . ASPIRIN 325 MG PO TABS   Oral   Take 325-650 mg by mouth daily as needed. As needed for toothaches.         . BC HEADACHE POWDER PO   Oral   Take 1 Package by mouth 2 (two) times daily as needed. As needed for toothache.            BP 153/95  Pulse 72  Temp 97.9 F (36.6 C)  Resp 16  SpO2 97%  Physical Exam  Nursing note and vitals reviewed. Constitutional: He appears well-developed and well-nourished. No distress.  HENT:  Head: Normocephalic and atraumatic.  Right Ear: External ear normal.  Left Ear: External ear normal.       Widespread dental caries.  No swelling or erythema.  Eyes: Conjunctivae normal are normal. Right eye exhibits no discharge. Left eye exhibits no discharge. No scleral icterus.  Neck: Neck supple. No tracheal deviation present.  Cardiovascular: Normal rate, regular rhythm and intact distal pulses.   Pulmonary/Chest: Effort normal and breath sounds normal. No stridor. No respiratory distress. He has no wheezes. He has no rales. He exhibits tenderness.       Mild tenderness to palpation of chest wall.   Abdominal: Soft. Bowel sounds are normal. He exhibits no distension. There is no tenderness. There is no rebound and no guarding.  Musculoskeletal: He exhibits no edema and no tenderness.  Neurological: He is alert. He has normal strength. No sensory deficit. Cranial nerve deficit:  no gross defecits noted. He exhibits normal muscle tone. He displays no seizure activity. Coordination normal.  Skin: Skin is warm and dry. No  rash noted.  Psychiatric: He has a normal mood and affect.    ED Course  Procedures (including critical care time)  DIAGNOSTIC STUDIES: Oxygen Saturation is 100% on room air, normal by my interpretation.    COORDINATION OF CARE: 12:49 PM Discussed treatment plan with pt at bedside and pt agreed to plan.    Rate: 70  Rhythm: normal sinus rhythm  QRS Axis: normal  Intervals: normal  ST/T Wave abnormalities: normal  Conduction Disutrbances:none  Narrative Interpretation:   Old EKG Reviewed: none available  Labs Reviewed  COMPREHENSIVE METABOLIC PANEL - Abnormal; Notable for the following:    Sodium 134 (*)     Glucose, Bld 102 (*)     AST 247 (*)      ALT 318 (*)     All other components within normal limits  CBC  PROTIME-INR  APTT  POCT I-STAT TROPONIN I   Dg Chest 2 View  04/25/2012  *RADIOLOGY REPORT*  Clinical Data: Chest pain.  CHEST - 2 VIEW  Comparison: April 12, 2008.  Findings: Cardiomediastinal silhouette appears normal.  No acute pulmonary disease is noted.  Bony thorax is intact.  IMPRESSION: No acute cardiopulmonary abnormality seen.   Original Report Authenticated By: Lupita Raider.,  M.D.    Dg Ribs Unilateral Left  04/25/2012  *RADIOLOGY REPORT*  Clinical Data: Injured last Friday, left anterior lower rib pain  LEFT RIBS - 2 VIEW  Comparison: None.  Findings: On one-view, there does appear to be a fracture of the anterior left seventh rib.  No other acute abnormality is seen.  No effusion is noted, and there is no evidence of pneumothorax on the images obtained.  IMPRESSION: Fracture of the anterior left seventh rib.   Original Report Authenticated By: Dwyane Dee, M.D.    US Abdomen Complete  04/25/2012  *RADIOLOGY REPORT*  Clinical Data:  Gallbladder disease.  Chest pain.  Rib fractures.  COMPLETE ABDOMINAL ULTRASOUND  Comparison:  None.  Findings:  Gallbladder:  No gallstones, gallbladder wall thickening, or pericholecystic fluid.  Common bile duct:  3 mm, normal.  Liver:  No focal lesion identified.  Within normal limits in parenchymal echogenicity.  IVC:  Appears normal.  Pancreas:  Obscured by overlying bowel gas.  Spleen:  8.7 cm.  No perisplenic fluid.  Right Kidney:  11.2 cm. Normal echotexture.  Normal central sinus echo complex.  No calculi or hydronephrosis.  Left Kidney:  11.7 cm. Normal echotexture.  Normal central sinus echo complex.  No calculi or hydronephrosis.  Abdominal aorta:  2.5 cm.  No aneurysm.  IMPRESSION: Negative abdominal ultrasound.   Original Report Authenticated By: Andreas Newport, M.D.        MDM  Patient's EKG and troponin are normal. I doubt acute coronary syndrome. He does have elevated  LFTs. Patient admits to regular excessive alcohol use. I suspect the LFTs are most likely related to that but considering his pain a work note she is abdomen to evaluate further  I personally performed the services described in this documentation, which was scribed in my presence.  The recorded information has been reviewed and considered.    Celene Kras, MD 04/25/12 1446  [place] normal.  Likely related to alcohol use.  Recc follow up with PCP.  Cut down on alcohol use  Celene Kras, MD 04/25/12 [phone]

## 2012-05-10 ENCOUNTER — Encounter: Payer: Self-pay | Admitting: Family Medicine

## 2012-05-10 ENCOUNTER — Ambulatory Visit (INDEPENDENT_AMBULATORY_CARE_PROVIDER_SITE_OTHER): Payer: Self-pay | Admitting: Family Medicine

## 2012-05-10 VITALS — BP 135/82 | HR 101 | Temp 98.5°F | Ht 71.0 in | Wt 207.0 lb

## 2012-05-10 DIAGNOSIS — F1010 Alcohol abuse, uncomplicated: Secondary | ICD-10-CM

## 2012-05-10 DIAGNOSIS — K219 Gastro-esophageal reflux disease without esophagitis: Secondary | ICD-10-CM

## 2012-05-10 DIAGNOSIS — R7401 Elevation of levels of liver transaminase levels: Secondary | ICD-10-CM | POA: Insufficient documentation

## 2012-05-10 DIAGNOSIS — F17200 Nicotine dependence, unspecified, uncomplicated: Secondary | ICD-10-CM

## 2012-05-10 DIAGNOSIS — Z720 Tobacco use: Secondary | ICD-10-CM | POA: Insufficient documentation

## 2012-05-10 DIAGNOSIS — R079 Chest pain, unspecified: Secondary | ICD-10-CM

## 2012-05-10 NOTE — Patient Instructions (Addendum)
Thank you for coming in today Please come back once you have your orange card so we can get more labs Please finish taking you prilosec. If your stomach pain comes back call in and let me know and I will restart your prescription Please try using nicotine patches to help with your smoking. You need as many milligrams of nicotine in a patch as you take in cigarettes.  1 cigarette has 1 mg of nicotine Continue to cut back on your drinking as this causes liver damage Have an awesome day

## 2012-05-13 ENCOUNTER — Encounter: Payer: Self-pay | Admitting: Family Medicine

## 2012-05-13 DIAGNOSIS — R079 Chest pain, unspecified: Secondary | ICD-10-CM | POA: Insufficient documentation

## 2012-05-13 NOTE — Assessment & Plan Note (Signed)
Occasional binge drinking Concern for significant hepatic damage  Pt trying to limit

## 2012-05-13 NOTE — Assessment & Plan Note (Signed)
Reviewed previous labwork Elevated liver fxn likely from drinking  Denies any h/o concerning possible Hep exposure but may need to check panel Will repeat CMET once pt has orange card.

## 2012-05-13 NOTE — Assessment & Plan Note (Signed)
Finish out prilosec for completeion of 2wk regimen.  Will continue if necessary

## 2012-05-13 NOTE — Assessment & Plan Note (Signed)
Significant h/o tobacco abuse Will try nicotine patches for now Spent greater than 5 min discussing cessation Handout given for [place] quitline

## 2012-05-13 NOTE — Progress Notes (Signed)
Dustin Olsen is a 41 y.o. male who presents to [facility] today for new pt visit, adn CP f/u  Tobacco abuse: Smoked 1.5ppd for 20 years and 1ppd for 9 yrs. Pt has tried quitting in the past unseccessfully. Has not tried patches or other pharmacotherapy. Denies SOB, cp  Stomach pain: started 2-3 mo ago. ;primarily after meals but now fairly constant. Started on a PPI on 11/6 w/ some relief. Ache in nature. Denies radiation, hematochezia, hematemasis, fever, unintentional wt loss  CP: Improving w/ PPI treatmetn. Denies diaphoresis, SOB, syncope, palpitations, radiation. Occurs primarily w/ foods and w/ lying down but had become fairly constant prior to starting PPI  Alcohol abuse: Years of significant ETOH use. Has tried to cut back over last several years. Will still binge from time to time. Last significant binge was from 11/3-4/13. Denies abdomainal swelling or hemopotosis.   The following portions of the patient's history were reviewed and updated as appropriate: allergies, current medications, past medical history, family and social history, and problem list.  Patient is a smoker  Past Medical History  Diagnosis Date  . Depression   . GERD (gastroesophageal reflux disease)   . Bipolar disorder     ROS as above otherwise neg.    Medications reviewed. Current Outpatient Prescriptions  Medication Sig Dispense Refill  . aspirin 325 MG tablet Take 325-650 mg by mouth daily as needed. As needed for toothaches.      . Aspirin-Salicylamide-Caffeine (BC HEADACHE POWDER PO) Take 1 Package by mouth 2 (two) times daily as needed. As needed for toothache.      Marland Kitchen HYDROcodone-acetaminophen (NORCO) 5-325 MG per tablet Take 1-2 tablets by mouth every 6 (six) hours as needed for pain.  16 tablet  0  . omeprazole (PRILOSEC) 20 MG capsule Take 1 capsule (20 mg total) by mouth daily.  20 capsule  0  . traMADol (ULTRAM) 50 MG tablet Take 1 tablet (50 mg total) by mouth every 6 (six) hours as needed for pain.  20  tablet  0    Exam:  BP 135/82  Pulse 101  Temp 98.5 F (36.9 C) (Oral)  Ht 5\' 11"  (1.803 m)  Wt 207 lb (93.895 kg)  BMI 28.87 kg/m2 Gen: Well NAD HEENT: EOMI,  MMM Lungs: CTABL Nl WOB Heart: RRR no MRG Abd: NABS, NT, ND Exts: Non edematous BL  LE, warm and well perfused.   No results found for this or any previous visit (from the past 72 hour(s)).

## 2012-05-13 NOTE — Assessment & Plan Note (Signed)
Reflux Non-cardiac.  Reviewed all previous medical records from recent urgent care and ED visits

## 2013-09-22 ENCOUNTER — Emergency Department (HOSPITAL_COMMUNITY): Payer: Self-pay

## 2013-09-22 ENCOUNTER — Emergency Department (HOSPITAL_COMMUNITY)
Admission: EM | Admit: 2013-09-22 | Discharge: 2013-09-22 | Disposition: A | Discharge status: Home / Self Care | Payer: Self-pay | Attending: Emergency Medicine | Admitting: Emergency Medicine

## 2013-09-22 ENCOUNTER — Encounter (HOSPITAL_COMMUNITY): Payer: Self-pay | Admitting: Emergency Medicine

## 2013-09-22 DIAGNOSIS — Y9289 Other specified places as the place of occurrence of the external cause: Secondary | ICD-10-CM | POA: Insufficient documentation

## 2013-09-22 DIAGNOSIS — F17200 Nicotine dependence, unspecified, uncomplicated: Secondary | ICD-10-CM | POA: Insufficient documentation

## 2013-09-22 DIAGNOSIS — F1010 Alcohol abuse, uncomplicated: Secondary | ICD-10-CM | POA: Insufficient documentation

## 2013-09-22 DIAGNOSIS — Z8659 Personal history of other mental and behavioral disorders: Secondary | ICD-10-CM | POA: Insufficient documentation

## 2013-09-22 DIAGNOSIS — Z8719 Personal history of other diseases of the digestive system: Secondary | ICD-10-CM | POA: Insufficient documentation

## 2013-09-22 DIAGNOSIS — S43109A Unspecified dislocation of unspecified acromioclavicular joint, initial encounter: Secondary | ICD-10-CM | POA: Insufficient documentation

## 2013-09-22 DIAGNOSIS — W11XXXA Fall on and from ladder, initial encounter: Secondary | ICD-10-CM | POA: Insufficient documentation

## 2013-09-22 DIAGNOSIS — Y9389 Activity, other specified: Secondary | ICD-10-CM | POA: Insufficient documentation

## 2013-09-22 DIAGNOSIS — R51 Headache: Secondary | ICD-10-CM | POA: Insufficient documentation

## 2013-09-22 DIAGNOSIS — Z880 Allergy status to penicillin: Secondary | ICD-10-CM | POA: Insufficient documentation

## 2013-09-22 LAB — BASIC METABOLIC PANEL
BUN: 7 mg/dL (ref 6–23)
CO2: 21 mEq/L (ref 19–32)
Calcium: 8.9 mg/dL (ref 8.4–10.5)
Chloride: 99 mEq/L (ref 96–112)
Creatinine, Ser: 0.54 mg/dL (ref 0.50–1.35)
GFR calc Af Amer: >90 mL/min (ref >90)
GFR calc non Af Amer: >90 mL/min (ref >90)
Glucose, Bld: 103 mg/dL — ABNORMAL HIGH (ref 70–99)
Potassium: 4.2 mEq/L (ref 3.7–5.3)
Sodium: 140 mEq/L (ref 137–147)

## 2013-09-22 LAB — CBC WITH DIFFERENTIAL/PLATELET
Basophils Absolute: 0.1 10*3/uL (ref 0.0–0.1)
Basophils Relative: 1 % (ref 0–1)
Eosinophils Absolute: 0.3 10*3/uL (ref 0.0–0.7)
Eosinophils Relative: 3 % (ref 0–5)
HCT: 46.1 % (ref 39.0–52.0)
Hemoglobin: 16.4 g/dL (ref 13.0–17.0)
Lymphocytes Relative: 24 % (ref 12–46)
Lymphs Abs: 2 10*3/uL (ref 0.7–4.0)
MCH: 33.1 pg (ref 26.0–34.0)
MCHC: 35.6 g/dL (ref 30.0–36.0)
MCV: 93.1 fL (ref 78.0–100.0)
Monocytes Absolute: 0.5 10*3/uL (ref 0.1–1.0)
Monocytes Relative: 6 % (ref 3–12)
Neutro Abs: 5.3 10*3/uL (ref 1.7–7.7)
Neutrophils Relative %: 66 % (ref 43–77)
Platelets: 341 10*3/uL (ref 150–400)
RBC: 4.95 MIL/uL (ref 4.22–5.81)
RDW: 12.9 % (ref 11.5–15.5)
WBC: 8.1 10*3/uL (ref 4.0–10.5)

## 2013-09-22 MED ORDER — OXYCODONE-ACETAMINOPHEN 5-325 MG PO TABS
2.0000 | ORAL_TABLET | Freq: Once | ORAL | Status: AC
  Administered 2013-09-22: 2 via ORAL
  Filled 2013-09-22: qty 2

## 2013-09-22 MED ORDER — ONDANSETRON HCL 4 MG PO TABS: 4.0000 mg | ORAL_TABLET | Freq: Four times a day (QID) | ORAL | Status: DC

## 2013-09-22 MED ORDER — ONDANSETRON 4 MG PO TBDP
8.0000 mg | ORAL_TABLET | Freq: Once | ORAL | Status: AC
  Administered 2013-09-22: 8 mg via ORAL
  Filled 2013-09-22: qty 2

## 2013-09-22 MED ORDER — OXYCODONE-ACETAMINOPHEN 5-325 MG PO TABS: 2.0000 | ORAL_TABLET | ORAL | Status: DC | PRN

## 2013-09-22 MED ORDER — ONDANSETRON HCL 4 MG/2ML IJ SOLN
4.0000 mg | Freq: Once | INTRAMUSCULAR | Status: AC
  Administered 2013-09-22: 4 mg via INTRAVENOUS
  Filled 2013-09-22: qty 2

## 2013-09-22 MED ORDER — HYDROMORPHONE HCL PF 1 MG/ML IJ SOLN
1.0000 mg | Freq: Once | INTRAMUSCULAR | Status: AC
  Administered 2013-09-22: 1 mg via INTRAVENOUS
  Filled 2013-09-22: qty 1

## 2013-09-22 NOTE — Progress Notes (Signed)
Orthopedic Tech Progress Note Patient Details:  Raylene MiyamotoFloyd T Mooney 11/15/1970 [id number]  Ortho Devices Type of Ortho Device: Sling immobilizer Ortho Device/Splint Interventions: Application   Cammer, Mickie BailJennifer Carol 09/22/2013, 1:08 PM

## 2013-09-22 NOTE — ED Notes (Signed)
MD at bedside. Hannah PA 

## 2013-09-22 NOTE — ED Notes (Signed)
MD at bedside. [place]  

## 2013-09-22 NOTE — ED Notes (Signed)
MD at bedside. Ortho MD

## 2013-09-22 NOTE — ED Notes (Signed)
OrthoTech @ bedside  

## 2013-09-22 NOTE — ED Notes (Signed)
Pt injured his left shoulder today about 2 hours by falling off a ladder and landing onto left shoulder. Pt reports about 708ft fall. Pt thinks he may have loss of consciousness. Pt admits to ETOH use after the fall. Pt ambulatory in triage.

## 2013-09-22 NOTE — ED Notes (Signed)
Introduced self to PT and family.

## 2013-09-22 NOTE — Discharge Instructions (Signed)
Acromioclavicular Separation  with Rehab  The acromioclavicular joint is the joint between the roof of the shoulder (acromion) and the collarbone (clavicle). It is vulnerable to injury. An acromioclavicular (AC) separation is a partial or complete tear (sprain), injury, or redness and soreness (inflammation) of the ligaments that cross the acromioclavicular joint and hold it in place. There are two ligaments in this area that are vulnerable to injury, the acromioclavicular ligament and the coracoclavicular ligament.  SYMPTOMS   · Tenderness and swelling, or a bump on top of the shoulder (at the AC joint).  · Bruising (contusion) in the area within 48 hours of injury.  · Loss of strength or pain when reaching over the head or across the body.  CAUSES   AC separation is caused by direct trauma to the joint (falling on your shoulder) or indirect trauma (falling on an outstretched arm).  RISK INCREASES WITH:  · Sports that require contact or collision, throwing sports (i.e. racquetball, squash).  · Poor strength and flexibility.  · Previous shoulder sprain or dislocation.  · Poorly fitted or padded protective equipment.  PREVENTION   · Warm-up and stretch properly before activity.  · Maintain physical fitness:  · Shoulder strength.  · Shoulder flexibility.  · Cardiovascular fitness.  · Wear properly fitted and padded protective equipment.  · Learn and use proper technique when playing sports. Have a coach correct improper technique, including falling and landing.  · Apply taping, protective strapping or padding, or an adhesive bandage as recommended before practice or competition.  PROGNOSIS   · If treated properly, the symptoms of AC separation can be expected to go away.  · If treated improperly, permanent disability may occur unless surgery is performed.  · Healing time varies with type of sport and position, arm injured (dominant versus non-dominant) and severity of sprain.  RELATED COMPLICATIONS  · Weakness and  fatigue of the arm or shoulder are possible but uncommon.  · Pain and inflammation of the AC joint may continue.  · Prolonged healing time may be necessary if usual activities are resumed too early. This causes a susceptibility to recurrent injury.  · Prolonged disability may occur.  · The shoulder may remain unstable or arthritic following repeated injury.  TREATMENT   Treatment initially involves ice and medication to help reduce pain and inflammation. It may also be necessary to modify your activities in order to prevent further injury.  Both non-surgical and surgical interventions exist to treat AC separation. Non-surgical intervention is usually recommended and involves wearing a sling to immobilize the joint for a period of time to allow for healing.  Surgical intervention is usually only considered for severe sprains of the ligament or for individuals who do not improve after 2 to 6 months of non-surgical treatment. Surgical interventions require 4 to 6 months before a return to sports is possible.  MEDICATION  · If pain medication is necessary, nonsteroidal anti-inflammatory medications, such as aspirin and ibuprofen, or other minor pain relievers, such as acetaminophen, are often recommended.  · Do not take pain medication for 7 days before surgery.  · Prescription pain relievers may be given by your caregiver. Use only as directed and only as much as you need.  · Ointments applied to the skin may be helpful.  · Corticosteroid injections may be given to reduce inflammation.  HEAT AND COLD  · Cold treatment (icing) relieves pain and reduces inflammation. Cold treatment should be applied for 10 to 15 minutes every 2   to 3 hours for inflammation and pain and immediately after any activity that aggravates your symptoms. Use ice packs or an ice massage.  · Heat treatment may be used prior to performing the stretching and strengthening activities prescribed by your caregiver, physical therapist or athletic  trainer. Use a heat pack or a warm soak.  SEEK IMMEDIATE MEDICAL CARE IF:   · Pain, swelling or bruising worsens despite treatment.  · There is pain, numbness or coldness in the arm.  · Discoloration appears in the fingernails.  · New, unexplained symptoms develop.  EXERCISES   RANGE OF MOTION (ROM) AND STRETCHING EXERCISES  Acromioclavicular Separation  These exercises may help you when beginning to rehabilitate your injury. Your symptoms may resolve with or without further involvement from your physician, physical therapist or athletic trainer. While completing these exercises, remember:  · Restoring tissue flexibility helps normal motion to return to the joints. This allows healthier, less painful movement and activity.  · An effective stretch should be held for at least 30 seconds.  · A stretch should never be painful. You should only feel a gentle lengthening or release in the stretched tissue.  ROM Pendulum  · Bend at the waist so that your right / left arm falls away from your body. Support yourself with your opposite hand on a solid surface, such as a table or a countertop.  · Your right / left arm should be perpendicular to the ground. If it is not perpendicular, you need to lean over farther. Relax the muscles in your right / left arm and shoulder as much as possible.  · Gently sway your hips and trunk so they move your right / left arm without any use of your right / left shoulder muscles.  · Progress your movements so that your right / left arm moves side to side, then forward and backward, and finally, both clockwise and counterclockwise.  · Complete __________ repetitions in each direction. Many people use this exercise to relieve discomfort in their shoulder as well as to gain range of motion.  Repeat __________ times. Complete this exercise __________ times per day.  STRETCH  Flexion, Seated   · Sit in a firm chair so that your right / left forearm can rest on a table or countertop. Your right / left  elbow should rest below the height of your shoulder so that your shoulder feels supported and not tense or uncomfortable.  · Keeping your right / left shoulder relaxed, lean forward at your waist, allowing your right / left hand to slide forward. Bend forward until you feel a moderate stretch in your shoulder, but before you feel an increase in your pain.  · Hold __________ seconds. Slowly return to your starting position.  Repeat __________ times. Complete this exercise __________ times per day.  STRETCH  Flexion, Standing  · Stand with good posture. With an underhand grip on your right / left and an overhand grip on the opposite hand, grasp a broomstick or cane so that your hands are a little more than shoulder-width apart.  · Keeping your right / left elbow straight and shoulder muscles relaxed, push the stick with your opposite hand to raise your right / left arm in front of your body and then overhead. Raise your arm until you feel a stretch in your right / left shoulder, but before you have increased shoulder pain.  · Try to avoid shrugging your right / left shoulder as your arm rises by keeping   your shoulder blade tucked down and toward your mid-back spine. Hold __________ seconds.  · Slowly return to the starting position.  Repeat __________ times. Complete this exercise __________ times per day.  STRENGTHENING EXERCISES  Acromioclavicular Separation  These exercises may help you when beginning to rehabilitate your injury. They may resolve your symptoms with or without further involvement from your physician, physical therapist or athletic trainer. While completing these exercises, remember:  · Muscles can gain both the endurance and the strength needed for everyday activities through controlled exercises.  · Complete these exercises as instructed by your physician, physical therapist or athletic trainer. Progress the resistance and repetitions only as guided.  · You may experience muscle soreness or  fatigue, but the pain or discomfort you are trying to eliminate should never worsen during these exercises. If this pain does worsen, stop and make certain you are following the directions exactly. If the pain is still present after adjustments, discontinue the exercise until you can discuss the trouble with your clinician.  STRENGTH Shoulder Abductors, Isometric   · With good posture, stand or sit about 4-6 inches from a wall with your right / left side facing the wall.  · Bend your right / left elbow. Gently press your right / left elbow into the wall. Increase the pressure gradually until you are pressing as hard as you can without shrugging your shoulder or increasing any shoulder discomfort.  · Hold __________ seconds.  · Release the tension slowly. Relax your shoulder muscles completely before you start the next repetition.  Repeat __________ times. Complete this exercise __________ times per day.  STRENGTH  Internal Rotators, Isometric  · Keep your right / left elbow at your side and bend it 90 degrees.  · Step into a door frame so that the inside of your right / left wrist can press against the door frame without your upper arm leaving your side.  · Gently press your right / left wrist into the door frame as if you were trying to draw the palm of your hand to your abdomen. Gradually increase the tension until you are pressing as hard as you can without shrugging your shoulder or increasing any shoulder discomfort.  · Hold __________ seconds.  · Release the tension slowly. Relax your shoulder muscles completely before you the next repetition.  Repeat __________ times. Complete this exercise __________ times per day.   STRENGTH  External Rotators, Isometric  · Keep your right / left elbow at your side and bend it 90 degrees.  · Step into a door frame so that the outside of your right / left wrist can press against the door frame without your upper arm leaving your side.  · Gently press your right / left wrist  into the door frame as if you were trying to swing the back of your hand away from your abdomen. Gradually increase the tension until you are pressing as hard as you can without shrugging your shoulder or increasing any shoulder discomfort.  · Hold __________ seconds.  · Release the tension slowly. Relax your shoulder muscles completely before you the next repetition.  Repeat __________ times. Complete this exercise __________ times per day.  STRENGTH  Internal Rotators  · Secure a rubber exercise band/tubing to a fixed object so that it is at the same height as your right / left elbow when you are standing or sitting on a firm surface.  · Stand or sit so that the secured exercise band/tubing is   at your right / left side.  · Bend your elbow 90 degrees. Place a folded towel or small pillow under your right / left arm so that your elbow is a few inches away from your side.  · Keeping the tension on the exercise band/tubing, pull it across your body toward your abdomen. Be sure to keep your body steady so that the movement is only coming from your shoulder rotating.  · Hold __________ seconds. Release the tension in a controlled manner as you return to the starting position.  Repeat __________ times. Complete this exercise __________ times per day.  STRENGTH  External Rotators  · Secure a rubber exercise band/tubing to a fixed object so that it is at the same height as your right / left elbow when you are standing or sitting on a firm surface.  · Stand or sit so that the secured exercise band/tubing is at your side that is not injured.  · Bend your elbow 90 degrees. Place a folded towel or small pillow under your right / left arm so that your elbow is a few inches away from your side.  · Keeping the tension on the exercise band/tubing, pull it away from your body, as if pivoting on your elbow. Be sure to keep your body steady so that the movement is only coming from your shoulder rotating.  · Hold __________ seconds.  Release the tension in a controlled manner as you return to the starting position.  Repeat __________ times. Complete this exercise __________ times per day.  Document Released: 06/06/2005 Document Revised: 08/29/2011 Document Reviewed: 09/18/2008  [employer]® Patient Information ©2014 [employer].

## 2013-09-22 NOTE — ED Provider Notes (Signed)
CSN: [id number]     Arrival date & time 09/22/13  1009 History   First MD Initiated Contact with Patient 09/22/13 1016     Chief Complaint  Patient presents with  . Shoulder Injury     (Consider location/radiation/quality/duration/timing/severity/associated sxs/prior Treatment) HPI Comments: Patient is a 43 year old male with history of depression, bipolar disorder, GERD he presents today with a left shoulder pain. He reports that earlier today he fell off the ladder. He landed directly onto his left shoulder. He reports that at the time he was not intoxicated, but has been self-medicating since the time of the fall. He reports that he drank approximately 12 beers. He has been ambulatory since the event. He states "I think I blacked out for a minute". He denies any prior injury to the shoulder.  Patient is a 43 y.o. male presenting with shoulder injury. The history is provided by the patient and the spouse. No language interpreter was used.  Shoulder Injury Associated symptoms include arthralgias and myalgias. Pertinent negatives include no chills or fever.    Past Medical History  Diagnosis Date  . Depression   . GERD (gastroesophageal reflux disease)   . Bipolar disorder    History reviewed. No pertinent past surgical history. Family History  Problem Relation Age of Onset  . Diabetes Mother   . Hyperlipidemia Father   . Hypertension Father   . Coronary artery disease Father     Stents placed  . Heart disease Maternal Grandmother   . Heart disease Paternal Grandmother    History  Substance Use Topics  . Smoking status: Current Every Day Smoker -- 1.50 packs/day  . Smokeless tobacco: Not on file  . Alcohol Use: Yes    Review of Systems  Constitutional: Negative for fever and chills.  Musculoskeletal: Positive for arthralgias, back pain and myalgias.  All other systems reviewed and are negative.      Allergies  Penicillins  Home Medications  No current outpatient  prescriptions on file. BP 129/74  Pulse 94  Temp(Src) 98.6 F (37 C) (Oral)  Resp 22  SpO2 95% Physical Exam  Nursing note and vitals reviewed. Constitutional: He is oriented to person, place, and time. He appears well-developed and well-nourished. No distress.  Disheveled   HENT:  Head: Normocephalic and atraumatic.  Right Ear: External ear normal.  Left Ear: External ear normal.  Nose: Nose normal.  No broken or loose teeth. Able to open and close mouth with ease.  Eyes: Conjunctivae and EOM are normal. Pupils are equal, round, and reactive to light.  Neck: Normal range of motion. No spinous process tenderness and no muscular tenderness present. No tracheal deviation present.  Cardiovascular: Normal rate, regular rhythm, normal heart sounds, intact distal pulses and normal pulses.   Pulses:      Radial pulses are 2+ on the right side, and 2+ on the left side.       Posterior tibial pulses are 2+ on the right side, and 2+ on the left side.  Capillary refill less than 3 seconds in all fingers and toes. Sensation intact in all fingers and toes.   Pulmonary/Chest: Effort normal and breath sounds normal. No stridor.    Abdominal: Soft. He exhibits no distension. There is no tenderness.  Musculoskeletal:       Left shoulder: He exhibits decreased range of motion, tenderness, bony tenderness, swelling, deformity and pain. He exhibits no laceration and normal pulse.  Deformity to the left a.c. joint. No skin tenting, neurovascularly  intact. Compartment soft.   Neurological: He is alert and oriented to person, place, and time.  Skin: Skin is warm and dry. He is not diaphoretic.  Psychiatric: He has a normal mood and affect. His behavior is normal.    ED Course  Procedures (including critical care time) Labs Review Labs Reviewed  BASIC METABOLIC PANEL - Abnormal; Notable for the following:    Glucose, Bld 103 (*)    All other components within normal limits  CBC WITH DIFFERENTIAL     Imaging Review Dg Chest 2 View  09/22/2013   CLINICAL DATA:  Recent fall from ladder  EXAM: CHEST  2 VIEW  COMPARISON:  04/25/2012  FINDINGS: Cardiac shadow is at the upper limits of normal in size. The lungs are clear. No pneumothorax is noted. There is widening of the acromioclavicular joint with some upper displacement of the distal clavicle. This is likely related to the recent injury. No other focal abnormality is seen.  IMPRESSION: Widening of the [facility] joint which appears to be related to the recent injury.  No other focal abnormality is noted.   Electronically Signed   By: Alcide Clever M.D.   On: 09/22/2013 11:32   Dg Lumbar Spine Complete  09/22/2013   CLINICAL DATA:  Recent fall from ladder  EXAM: LUMBAR SPINE - COMPLETE 4+ VIEW  COMPARISON:  None.  FINDINGS: Vertebral body height is well maintained. Mild disc space narrowing is noted in the lower lumbar spine. No acute fracture is seen. No gross soft tissue abnormality is noted.  IMPRESSION: Mild degenerative change lower lumbar spine. No acute abnormality noted.   Electronically Signed   By: Alcide Clever M.D.   On: 09/22/2013 11:30   Dg Pelvis 1-2 Views  09/22/2013   CLINICAL DATA:  Recent fall from ladder  EXAM: PELVIS - 1-2 VIEW  COMPARISON:  None.  FINDINGS: There is no evidence of pelvic fracture or diastasis. No other pelvic bone lesions are seen.  IMPRESSION: No acute abnormality noted.   Electronically Signed   By: Alcide Clever M.D.   On: 09/22/2013 11:30   Dg Clavicle Left  09/22/2013   CLINICAL DATA:  Recent fall from ladder  EXAM: LEFT CLAVICLE - 2+ VIEWS  COMPARISON:  None.  FINDINGS: No acute fracture is identified. Mild widening of the acromioclavicular joint is seen. This may be related to the recent injury. No other focal abnormality is noted  IMPRESSION: Widening of the [facility] joint.  No acute fracture is noted.   Electronically Signed   By: Alcide Clever M.D.   On: 09/22/2013 11:31   Dg Shoulder 1v Left  09/22/2013   CLINICAL DATA:   Status post traumatic injury  EXAM: LEFT SHOULDER - 1 VIEW  COMPARISON:  None.  FINDINGS: Single axillary view of the left shoulder shows no acute fracture dislocation. No gross soft tissue abnormality is noted.   Electronically Signed   By: Alcide Clever M.D.   On: 09/22/2013 14:12   Ct Head Wo Contrast  09/22/2013   CLINICAL DATA:  Left shoulder injury. Fell off ladder. Head pain. Neck pain. Possible loss of consciousness.  EXAM: CT HEAD WITHOUT CONTRAST  CT CERVICAL SPINE WITHOUT CONTRAST  TECHNIQUE: Multidetector CT imaging of the head and cervical spine was performed following the standard protocol without intravenous contrast. Multiplanar CT image reconstructions of the cervical spine were also generated.  COMPARISON:  None.  FINDINGS: CT HEAD FINDINGS  No evidence for acute infarction, hemorrhage, mass lesion, hydrocephalus,  or extra-axial fluid. Normal for age cerebral volume. No white matter disease.  Calvarium is intact.  Chronic sinus disease.  No mastoid fluid.  CT CERVICAL SPINE FINDINGS  There is no visible cervical spine fracture, traumatic subluxation, prevertebral soft tissue swelling, or intraspinal hematoma. Mild disc space narrowing C5-C6. Minor scoliosis convex left. Carotid atherosclerosis. Midline airway. Lung apices not visualized. No visible neck masses. Left T1 and T2 ribs are fused posteriorly.  IMPRESSION: Unremarkable CT head and cervical spine. No acute abnormalities are identified.   Electronically Signed   By: Davonna BellingJohn  Curnes M.D.   On: 09/22/2013 11:00   Ct Cervical Spine Wo Contrast  09/22/2013   CLINICAL DATA:  Left shoulder injury. Fell off ladder. Head pain. Neck pain. Possible loss of consciousness.  EXAM: CT HEAD WITHOUT CONTRAST  CT CERVICAL SPINE WITHOUT CONTRAST  TECHNIQUE: Multidetector CT imaging of the head and cervical spine was performed following the standard protocol without intravenous contrast. Multiplanar CT image reconstructions of the cervical spine were also  generated.  COMPARISON:  None.  FINDINGS: CT HEAD FINDINGS  No evidence for acute infarction, hemorrhage, mass lesion, hydrocephalus, or extra-axial fluid. Normal for age cerebral volume. No white matter disease.  Calvarium is intact.  Chronic sinus disease.  No mastoid fluid.  CT CERVICAL SPINE FINDINGS  There is no visible cervical spine fracture, traumatic subluxation, prevertebral soft tissue swelling, or intraspinal hematoma. Mild disc space narrowing C5-C6. Minor scoliosis convex left. Carotid atherosclerosis. Midline airway. Lung apices not visualized. No visible neck masses. Left T1 and T2 ribs are fused posteriorly.  IMPRESSION: Unremarkable CT head and cervical spine. No acute abnormalities are identified.   Electronically Signed   By: Davonna BellingJohn  Curnes M.D.   On: 09/22/2013 11:00   Dg Shoulder Left  09/22/2013   CLINICAL DATA:  Recent fall from ladder with pain  EXAM: LEFT SHOULDER - 2+ VIEW  COMPARISON:  None.  FINDINGS: The acromioclavicular joint is widened. This may be related to the recent injury. No acute fracture is seen. The underlying bony thorax is unremarkable.  IMPRESSION: Widened AC joint likely related to the recent injury.   Electronically Signed   By: Alcide CleverMark  Lukens M.D.   On: 09/22/2013 11:33     EKG Interpretation None      MDM   Final diagnoses:  Acromioclavicular joint separation, type 3    Patient presents to the emergency department after a fall. He landed on his left shoulder. X-ray shows widened a.c. joint. Dr. Shelle IronBeane was able to evaluate the patient in the emergency department. He confirms that this is a grade 3 a.c. joint separation. The patient is neurovascularly intact and the compartment is soft. He can be managed with a sling, ice, rest. He was given rehabilitation exercises to perform at home. He is to followup with Dr. Shelle IronBeane in the office. He has no other injuries. Head CT shows no intracranial pathology. Patient is able to ambulate in the ED and continues to only  complain of shoulder pain. Patient understands and is agreeable to plan. Presents to return to the emergency department immediately were discussed. Vital signs stable for discharge. Dr. Wilkie AyeHorton evaluated this patient and agrees with plan. Patient / Family / Caregiver informed of clinical course, understand medical decision-making process, and agree with plan.    Mora BellmanHannah S Tiziana Cislo, PA-C 09/22/13 1444

## 2013-09-22 NOTE — ED Provider Notes (Signed)
Medical screening examination/treatment/procedure(s) were conducted as a shared visit with non-physician practitioner(s) and myself.  I personally evaluated the patient during the encounter.   EKG Interpretation None      Patient presents with left shoulder pain following a fall. Noted to have a.c. separation. Patient is reporting pain. He is neurovascularly intact. He does have some mild tenting of the skin. Discuss with also on call. Was evaluated and we'll treat conservatively with a sling, pain medication, and ice.  After history, exam, and medical workup I feel the patient has been appropriately medically screened and is safe for discharge home. Pertinent diagnoses were discussed with the patient. Patient was given return precautions.   Shon Batonourtney F Irvan Tiedt, MD 09/22/13 [phone]

## 2013-09-22 NOTE — ED Notes (Signed)
Paged ortho. Ortho called back and said they would come by (may be a little while b/c a few other pt's ahead of ours)

## 2013-09-22 NOTE — Consult Note (Signed)
Reason for Consult: Golden Circle of ladder Referring Physician: EDP  SYLVESTER MINTON is an 43 y.o. male.  HPI: Golden Circle off ladder  Past Medical History  Diagnosis Date  . Depression   . GERD (gastroesophageal reflux disease)   . Bipolar disorder     History reviewed. No pertinent past surgical history.  Family History  Problem Relation Age of Onset  . Diabetes Mother   . Hyperlipidemia Father   . Hypertension Father   . Coronary artery disease Father     Stents placed  . Heart disease Maternal Grandmother   . Heart disease Paternal Grandmother     Social History:  reports that he has been smoking.  He does not have any smokeless tobacco history on file. He reports that he drinks alcohol. He reports that he does not use illicit drugs.  Allergies:  Allergies  Allergen Reactions  . Penicillins Rash    Have has as a child     Medications: I have reviewed the patient's current medications.  Results for orders placed during the hospital encounter of 09/22/13 (from the past 48 hour(s))  CBC WITH DIFFERENTIAL     Status: None   Collection Time    09/22/13 10:40 AM      Result Value Ref Range   WBC 8.1  4.0 - 10.5 K/uL   RBC 4.95  4.22 - 5.81 MIL/uL   Hemoglobin 16.4  13.0 - 17.0 g/dL   HCT 46.1  39.0 - 52.0 %   MCV 93.1  78.0 - 100.0 fL   MCH 33.1  26.0 - 34.0 pg   MCHC 35.6  30.0 - 36.0 g/dL   RDW 12.9  11.5 - 15.5 %   Platelets 341  150 - 400 K/uL   Neutrophils Relative % 66  43 - 77 %   Neutro Abs 5.3  1.7 - 7.7 K/uL   Lymphocytes Relative 24  12 - 46 %   Lymphs Abs 2.0  0.7 - 4.0 K/uL   Monocytes Relative 6  3 - 12 %   Monocytes Absolute 0.5  0.1 - 1.0 K/uL   Eosinophils Relative 3  0 - 5 %   Eosinophils Absolute 0.3  0.0 - 0.7 K/uL   Basophils Relative 1  0 - 1 %   Basophils Absolute 0.1  0.0 - 0.1 K/uL  BASIC METABOLIC PANEL     Status: Abnormal   Collection Time    09/22/13 10:40 AM      Result Value Ref Range   Sodium 140  137 - 147 mEq/L   Potassium 4.2  3.7 - 5.3  mEq/L   Chloride 99  96 - 112 mEq/L   CO2 21  19 - 32 mEq/L   Glucose, Bld 103 (*) 70 - 99 mg/dL   BUN 7  6 - 23 mg/dL   Creatinine, Ser 0.54  0.50 - 1.35 mg/dL   Calcium 8.9  8.4 - 10.5 mg/dL   GFR calc non Af Amer >90  >90 mL/min   GFR calc Af Amer >90  >90 mL/min   Comment: (NOTE)     The eGFR has been calculated using the CKD EPI equation.     This calculation has not been validated in all clinical situations.     eGFR's persistently <90 mL/min signify possible Chronic Kidney     Disease.    Dg Chest 2 View  09/22/2013   CLINICAL DATA:  Recent fall from ladder  EXAM: CHEST  2 VIEW  COMPARISON:  04/25/2012  FINDINGS: Cardiac shadow is at the upper limits of normal in size. The lungs are clear. No pneumothorax is noted. There is widening of the acromioclavicular joint with some upper displacement of the distal clavicle. This is likely related to the recent injury. No other focal abnormality is seen.  IMPRESSION: Widening of the [facility] joint which appears to be related to the recent injury.  No other focal abnormality is noted.   Electronically Signed   By: Inez Catalina M.D.   On: 09/22/2013 11:32   Dg Lumbar Spine Complete  09/22/2013   CLINICAL DATA:  Recent fall from ladder  EXAM: LUMBAR SPINE - COMPLETE 4+ VIEW  COMPARISON:  None.  FINDINGS: Vertebral body height is well maintained. Mild disc space narrowing is noted in the lower lumbar spine. No acute fracture is seen. No gross soft tissue abnormality is noted.  IMPRESSION: Mild degenerative change lower lumbar spine. No acute abnormality noted.   Electronically Signed   By: Inez Catalina M.D.   On: 09/22/2013 11:30   Dg Pelvis 1-2 Views  09/22/2013   CLINICAL DATA:  Recent fall from ladder  EXAM: PELVIS - 1-2 VIEW  COMPARISON:  None.  FINDINGS: There is no evidence of pelvic fracture or diastasis. No other pelvic bone lesions are seen.  IMPRESSION: No acute abnormality noted.   Electronically Signed   By: Inez Catalina M.D.   On: 09/22/2013  11:30   Dg Clavicle Left  09/22/2013   CLINICAL DATA:  Recent fall from ladder  EXAM: LEFT CLAVICLE - 2+ VIEWS  COMPARISON:  None.  FINDINGS: No acute fracture is identified. Mild widening of the acromioclavicular joint is seen. This may be related to the recent injury. No other focal abnormality is noted  IMPRESSION: Widening of the [facility] joint.  No acute fracture is noted.   Electronically Signed   By: Inez Catalina M.D.   On: 09/22/2013 11:31   Ct Head Wo Contrast  09/22/2013   CLINICAL DATA:  Left shoulder injury. Fell off ladder. Head pain. Neck pain. Possible loss of consciousness.  EXAM: CT HEAD WITHOUT CONTRAST  CT CERVICAL SPINE WITHOUT CONTRAST  TECHNIQUE: Multidetector CT imaging of the head and cervical spine was performed following the standard protocol without intravenous contrast. Multiplanar CT image reconstructions of the cervical spine were also generated.  COMPARISON:  None.  FINDINGS: CT HEAD FINDINGS  No evidence for acute infarction, hemorrhage, mass lesion, hydrocephalus, or extra-axial fluid. Normal for age cerebral volume. No white matter disease.  Calvarium is intact.  Chronic sinus disease.  No mastoid fluid.  CT CERVICAL SPINE FINDINGS  There is no visible cervical spine fracture, traumatic subluxation, prevertebral soft tissue swelling, or intraspinal hematoma. Mild disc space narrowing C5-C6. Minor scoliosis convex left. Carotid atherosclerosis. Midline airway. Lung apices not visualized. No visible neck masses. Left T1 and T2 ribs are fused posteriorly.  IMPRESSION: Unremarkable CT head and cervical spine. No acute abnormalities are identified.   Electronically Signed   By: Rolla Flatten M.D.   On: 09/22/2013 11:00   Ct Cervical Spine Wo Contrast  09/22/2013   CLINICAL DATA:  Left shoulder injury. Fell off ladder. Head pain. Neck pain. Possible loss of consciousness.  EXAM: CT HEAD WITHOUT CONTRAST  CT CERVICAL SPINE WITHOUT CONTRAST  TECHNIQUE: Multidetector CT imaging of the head  and cervical spine was performed following the standard protocol without intravenous contrast. Multiplanar CT image reconstructions of the cervical spine were also generated.  COMPARISON:  None.  FINDINGS: CT HEAD FINDINGS  No evidence for acute infarction, hemorrhage, mass lesion, hydrocephalus, or extra-axial fluid. Normal for age cerebral volume. No white matter disease.  Calvarium is intact.  Chronic sinus disease.  No mastoid fluid.  CT CERVICAL SPINE FINDINGS  There is no visible cervical spine fracture, traumatic subluxation, prevertebral soft tissue swelling, or intraspinal hematoma. Mild disc space narrowing C5-C6. Minor scoliosis convex left. Carotid atherosclerosis. Midline airway. Lung apices not visualized. No visible neck masses. Left T1 and T2 ribs are fused posteriorly.  IMPRESSION: Unremarkable CT head and cervical spine. No acute abnormalities are identified.   Electronically Signed   By: Rolla Flatten M.D.   On: 09/22/2013 11:00   Dg Shoulder Left  09/22/2013   CLINICAL DATA:  Recent fall from ladder with pain  EXAM: LEFT SHOULDER - 2+ VIEW  COMPARISON:  None.  FINDINGS: The acromioclavicular joint is widened. This may be related to the recent injury. No acute fracture is seen. The underlying bony thorax is unremarkable.  IMPRESSION: Widened AC joint likely related to the recent injury.   Electronically Signed   By: Inez Catalina M.D.   On: 09/22/2013 11:33    Review of Systems  Constitutional: Negative.   HENT: Negative.   Eyes: Negative.   Respiratory: Negative.   Cardiovascular: Negative.   Musculoskeletal: Positive for joint pain.  Skin: Negative.   Neurological: Negative.   Endo/Heme/Allergies: Negative.   Psychiatric/Behavioral: Negative.    Blood pressure 137/63, pulse 104, temperature 98.6 F (37 C), temperature source Oral, resp. rate 22, SpO2 95.00%. Physical Exam  Constitutional: He appears well-developed. He appears distressed.  HENT:  Head: Normocephalic.  Eyes:  Pupils are equal, round, and reactive to light.  Neck: Normal range of motion. Neck supple.  Cardiovascular: Normal rate.   Respiratory: Effort normal.  GI: Soft.  Musculoskeletal: He exhibits tenderness.  No skin tenting. NVI Not posteriorly displace.  Neurological:  ETOH on board  Skin: Skin is warm and dry.  Psychiatric: He has a normal mood and affect.    Assessment/Plan:  Left Grade III AC separation. NVI. Previous clavicle fx. ETOH+ Sling ICE Conservative F/U  2 weeks Dr. Tonita Cong Pendulum exercises.  Kupono Marling C 09/22/2013, 1:11 PM

## 2013-10-14 ENCOUNTER — Encounter (HOSPITAL_COMMUNITY): Payer: Self-pay | Admitting: Emergency Medicine

## 2013-10-14 ENCOUNTER — Emergency Department (INDEPENDENT_AMBULATORY_CARE_PROVIDER_SITE_OTHER)
Admission: EM | Admit: 2013-10-14 | Discharge: 2013-10-14 | Disposition: A | Discharge status: Home / Self Care | Payer: Self-pay | Source: Home / Self Care

## 2013-10-14 ENCOUNTER — Emergency Department (HOSPITAL_COMMUNITY): Payer: Self-pay

## 2013-10-14 ENCOUNTER — Emergency Department (INDEPENDENT_AMBULATORY_CARE_PROVIDER_SITE_OTHER): Payer: Self-pay

## 2013-10-14 DIAGNOSIS — W11XXXA Fall on and from ladder, initial encounter: Secondary | ICD-10-CM

## 2013-10-14 DIAGNOSIS — S2239XA Fracture of one rib, unspecified side, initial encounter for closed fracture: Secondary | ICD-10-CM

## 2013-10-14 DIAGNOSIS — S2249XA Multiple fractures of ribs, unspecified side, initial encounter for closed fracture: Secondary | ICD-10-CM

## 2013-10-14 MED ORDER — KETOROLAC TROMETHAMINE 60 MG/2ML IM SOLN
60.0000 mg | Freq: Once | INTRAMUSCULAR | Status: AC
  Administered 2013-10-14: 60 mg via INTRAMUSCULAR

## 2013-10-14 MED ORDER — OXYCODONE-ACETAMINOPHEN 5-325 MG PO TABS: 1.0000 | ORAL_TABLET | ORAL | Status: DC | PRN

## 2013-10-14 MED ORDER — KETOROLAC TROMETHAMINE 60 MG/2ML IM SOLN
INTRAMUSCULAR | Status: AC
  Filled 2013-10-14: qty 2

## 2013-10-14 NOTE — Discharge Instructions (Signed)
It is important that you focus on slow deep breathing to prevent pneumonia.  You can splint your chest with a pillow to help with discomfort.    Rib Fracture A rib fracture is a break or crack in one of the bones of the ribs. The ribs are a group of long, curved bones that wrap around your chest and attach to your spine. They protect your lungs and other organs in the chest cavity. A broken or cracked rib is often painful, but most do not cause other problems. Most rib fractures heal on their own over time. However, rib fractures can be more serious if multiple ribs are broken or if broken ribs move out of place and push against other structures. CAUSES   A direct blow to the chest. For example, this could happen during contact sports, a car accident, or a fall against a hard object.  Repetitive movements with high force, such as pitching a baseball or having severe coughing spells. SYMPTOMS   Pain when you breathe in or cough.  Pain when someone presses on the injured area. DIAGNOSIS  Your caregiver will perform a physical exam. Various imaging tests may be ordered to confirm the diagnosis and to look for related injuries. These tests may include a chest X-ray, computed tomography (CT), magnetic resonance imaging (MRI), or a bone scan. TREATMENT  Rib fractures usually heal on their own in 1 3 months. The longer healing period is often associated with a continued cough or other aggravating activities. During the healing period, pain control is very important. Medication is usually given to control pain. Hospitalization or surgery may be needed for more severe injuries, such as those in which multiple ribs are broken or the ribs have moved out of place.  HOME CARE INSTRUCTIONS   Avoid strenuous activity and any activities or movements that cause pain. Be careful during activities and avoid bumping the injured rib.  Gradually increase activity as directed by your caregiver.  Only take  over-the-counter or prescription medications as directed by your caregiver. Do not take other medications without asking your caregiver first.  Apply ice to the injured area for the first 1 2 days after you have been treated or as directed by your caregiver. Applying ice helps to reduce inflammation and pain.  Put ice in a plastic bag.  Place a towel between your skin and the bag.   Leave the ice on for 15 20 minutes at a time, every 2 hours while you are awake.  Perform deep breathing as directed by your caregiver. This will help prevent pneumonia, which is a common complication of a broken rib. Your caregiver may instruct you to:  Take deep breaths several times a day.  Try to cough several times a day, holding a pillow against the injured area.  Use a device called an incentive spirometer to practice deep breathing several times a day.  Drink enough fluids to keep your urine clear or pale yellow. This will help you avoid constipation.   Do not wear a rib belt or binder. These restrict breathing, which can lead to pneumonia.  SEEK IMMEDIATE MEDICAL CARE IF:   You have a fever.   You have difficulty breathing or shortness of breath.   You develop a continual cough, or you cough up thick or bloody sputum.  You feel sick to your stomach (nausea), throw up (vomit), or have abdominal pain.   You have worsening pain not controlled with medications.  MAKE SURE YOU:  Understand these instructions.  Will watch your condition.  Will get help right away if you are not doing well or get worse. Document Released: 06/06/2005 Document Revised: 02/06/2013 Document Reviewed: 08/08/2012 [facility] Patient Information 2014 [employer] [place].

## 2013-10-14 NOTE — ED Provider Notes (Signed)
CSN: [id number]     Arrival date & time 10/14/13  1747 History   None    Chief Complaint  Patient presents with  . Fall     having trouble breathing.     (Consider location/radiation/quality/duration/timing/severity/associated sxs/prior Treatment)  HPI  The patient is a 43 year old male who states he felt from his attic ladder an approximate 5-6 foot distance 6 days ago.The patient states that he is having difficulty breathing due to discomfort in his back and chest.  Past Medical History  Diagnosis Date  . Depression   . GERD (gastroesophageal reflux disease)   . Bipolar disorder    History reviewed. No pertinent past surgical history. Family History  Problem Relation Age of Onset  . Diabetes Mother   . Hyperlipidemia Father   . Hypertension Father   . Coronary artery disease Father     Stents placed  . Heart disease Maternal Grandmother   . Heart disease Paternal Grandmother    History  Substance Use Topics  . Smoking status: Current Every Day Smoker -- 1.50 packs/day  . Smokeless tobacco: Not on file  . Alcohol Use: Yes    Review of Systems  Constitutional: Negative.  Negative for fever, chills and fatigue.  HENT: Negative.   Eyes: Negative.   Respiratory: Positive for cough and shortness of breath. Negative for chest tightness, wheezing and stridor.   Cardiovascular: Positive for chest pain.       Patient states the chest pain is reproducible with breathing and movement.  In addition, he states coughing he is "unbearable".  Gastrointestinal: Negative.  Negative for nausea, vomiting, abdominal pain, blood in stool and abdominal distention.  Endocrine: Negative.   Genitourinary: Negative.  Negative for dysuria, hematuria and flank pain.  Musculoskeletal: Positive for myalgias. Negative for back pain.  Skin: Negative.  Negative for color change, pallor and rash.  Allergic/Immunologic: Negative.   Neurological: Negative.   Hematological: Negative.     Psychiatric/Behavioral: Negative.     Allergies  Penicillins  Home Medications   Prior to Admission medications   Medication Sig Start Date End Date Taking? Authorizing Provider  ondansetron (ZOFRAN) 4 MG tablet Take 1 tablet (4 mg total) by mouth every 6 (six) hours. 09/22/13   Mora BellmanHannah S Merrell, PA-C  oxyCODONE-acetaminophen (PERCOCET/ROXICET) 5-325 MG per tablet Take 2 tablets by mouth every 4 (four) hours as needed for severe pain. 09/22/13   Ramon DredgeHannah S Merrell, PA-C   BP 145/90  Pulse 99  Temp(Src) 98.6 F (37 C) (Oral)  Resp 18  SpO2 100%   Physical Exam  Constitutional: He is oriented to person, place, and time. He appears well-developed and well-nourished. No distress.  HENT:  Head: Normocephalic and atraumatic.  Eyes: Pupils are equal, round, and reactive to light.  Neck: Normal range of motion. Neck supple.  Cardiovascular: Normal rate, regular rhythm, normal heart sounds and intact distal pulses.  Exam reveals no gallop and no friction rub.   No murmur heard. Pulmonary/Chest: Effort normal and breath sounds normal. No respiratory distress. He has no wheezes. He has no rales. He exhibits tenderness.  Abdominal: Soft. Bowel sounds are normal. He exhibits no distension and no mass. There is no tenderness. There is no rebound and no guarding.  Musculoskeletal: Normal range of motion. He exhibits tenderness. He exhibits no edema.       Arms: Neurological: He is alert and oriented to person, place, and time. He displays normal reflexes. No cranial nerve deficit. He exhibits normal muscle tone.  Coordination normal.  Skin: Skin is warm and dry. No rash noted. He is not diaphoretic. No erythema.    ED Course  Procedures (including critical care time) Labs Review Labs Reviewed - No data to display  Imaging Review Dg Chest 2 View  10/14/2013   CLINICAL DATA:  Fall 6 days ago, left-sided chest pain  EXAM: CHEST  2 VIEW  COMPARISON:  None.  FINDINGS: There is no focal parenchymal  opacity, pleural effusion, or pneumothorax. Stable cardiomediastinal silhouette.  There are nondisplaced fractures of the left posterior sixth, seventh and ribs. There is evidence of chronic left AC separation.  IMPRESSION: No active cardiopulmonary disease.  Nondisplaced fractures of the left posterior sixth, seventh and ribs.   Electronically Signed   By: Elige KoHetal  Patel   On: 10/14/2013 18:32   Plan of care discussed with Dr. Artis FlockKindl.   MDM   1. Rib fractures    Meds ordered this encounter  Medications  . ketorolac (TORADOL) injection 60 mg    Sig:   . oxyCODONE-acetaminophen (PERCOCET/ROXICET) 5-325 MG per tablet    Sig: Take 1-2 tablets by mouth every 4 (four) hours as needed for severe pain.    Dispense:  15 tablet    Refill:  0   Chest the importance of splinting and deep breathing. Patient is given an incentive from her instructed on use.The patient verbalizes understanding and agrees to plan of care.       Weber Cooksatherine Japleen Tornow, NP 10/14/13 2129

## 2013-10-14 NOTE — ED Notes (Signed)
Reports falling attic stairs 5 to 6 days ago.   Having rib pain and sob.   Having pain in left hip   Pt has been icing hip and using ace wrap on ribs.

## 2013-10-14 NOTE — ED Notes (Signed)
Incentive spirometer given to pt. He was instructed on its use and to use 3-5 times each day, each time 3-5 full deep breaths. Pt verbalized his understanding of same.

## 2013-10-15 NOTE — ED Provider Notes (Signed)
Medical screening examination/treatment/procedure(s) were performed by resident physician or non-physician practitioner and as supervising physician I was immediately available for consultation/collaboration.   Orine Goga DOUGLAS MD.   Janine Reller D Afrika Brick, MD 10/15/13 0832 

## 2013-10-23 ENCOUNTER — Encounter (HOSPITAL_COMMUNITY): Payer: Self-pay | Admitting: Emergency Medicine

## 2013-10-23 ENCOUNTER — Emergency Department (HOSPITAL_COMMUNITY)
Admission: EM | Admit: 2013-10-23 | Discharge: 2013-10-23 | Disposition: A | Discharge status: Home / Self Care | Payer: Self-pay | Attending: Emergency Medicine | Admitting: Emergency Medicine

## 2013-10-23 ENCOUNTER — Emergency Department (HOSPITAL_COMMUNITY): Payer: Self-pay

## 2013-10-23 DIAGNOSIS — R000 Tachycardia, unspecified: Secondary | ICD-10-CM | POA: Insufficient documentation

## 2013-10-23 DIAGNOSIS — Z8781 Personal history of (healed) traumatic fracture: Secondary | ICD-10-CM | POA: Insufficient documentation

## 2013-10-23 DIAGNOSIS — Z880 Allergy status to penicillin: Secondary | ICD-10-CM | POA: Insufficient documentation

## 2013-10-23 DIAGNOSIS — R079 Chest pain, unspecified: Secondary | ICD-10-CM | POA: Insufficient documentation

## 2013-10-23 DIAGNOSIS — G8911 Acute pain due to trauma: Secondary | ICD-10-CM | POA: Insufficient documentation

## 2013-10-23 DIAGNOSIS — Z8659 Personal history of other mental and behavioral disorders: Secondary | ICD-10-CM | POA: Insufficient documentation

## 2013-10-23 DIAGNOSIS — Z8719 Personal history of other diseases of the digestive system: Secondary | ICD-10-CM | POA: Insufficient documentation

## 2013-10-23 DIAGNOSIS — F17200 Nicotine dependence, unspecified, uncomplicated: Secondary | ICD-10-CM | POA: Insufficient documentation

## 2013-10-23 LAB — BASIC METABOLIC PANEL
BUN: 7 mg/dL (ref 6–23)
CO2: 20 mEq/L (ref 19–32)
Calcium: 9 mg/dL (ref 8.4–10.5)
Chloride: 96 mEq/L (ref 96–112)
Creatinine, Ser: 0.6 mg/dL (ref 0.50–1.35)
GFR calc Af Amer: >90 mL/min (ref >90)
GFR calc non Af Amer: >90 mL/min (ref >90)
Glucose, Bld: 98 mg/dL (ref 70–99)
Potassium: 4 mEq/L (ref 3.7–5.3)
Sodium: 136 mEq/L — ABNORMAL LOW (ref 137–147)

## 2013-10-23 LAB — CBC
HCT: 47.1 % (ref 39.0–52.0)
Hemoglobin: 16.8 g/dL (ref 13.0–17.0)
MCH: 32.8 pg (ref 26.0–34.0)
MCHC: 35.7 g/dL (ref 30.0–36.0)
MCV: 92 fL (ref 78.0–100.0)
Platelets: 423 10*3/uL — ABNORMAL HIGH (ref 150–400)
RBC: 5.12 MIL/uL (ref 4.22–5.81)
RDW: 13.1 % (ref 11.5–15.5)
WBC: 7.7 10*3/uL (ref 4.0–10.5)

## 2013-10-23 LAB — I-STAT TROPONIN, ED: Troponin i, poc: 0 ng/mL (ref 0.00–0.08)

## 2013-10-23 MED ORDER — IBUPROFEN 800 MG PO TABS: 800.0000 mg | ORAL_TABLET | Freq: Three times a day (TID) | ORAL | Status: AC

## 2013-10-23 MED ORDER — HYDROMORPHONE HCL PF 1 MG/ML IJ SOLN: 1.0000 mg | Freq: Once | INTRAMUSCULAR | Status: DC

## 2013-10-23 MED ORDER — HYDROMORPHONE HCL 2 MG PO TABS: 1.0000 mg | ORAL_TABLET | Freq: Four times a day (QID) | ORAL | Status: DC | PRN

## 2013-10-23 MED ORDER — ALBUTEROL SULFATE HFA 108 (90 BASE) MCG/ACT IN AERS: 2.0000 | INHALATION_SPRAY | Freq: Four times a day (QID) | RESPIRATORY_TRACT | Status: DC

## 2013-10-23 MED ORDER — HYDROMORPHONE HCL PF 1 MG/ML IJ SOLN
1.0000 mg | Freq: Once | INTRAMUSCULAR | Status: AC
  Administered 2013-10-23: 1 mg via INTRAMUSCULAR
  Filled 2013-10-23: qty 1

## 2013-10-23 MED ORDER — ALBUTEROL SULFATE HFA 108 (90 BASE) MCG/ACT IN AERS
2.0000 | INHALATION_SPRAY | Freq: Once | RESPIRATORY_TRACT | Status: AC
  Administered 2013-10-23: 2 via RESPIRATORY_TRACT
  Filled 2013-10-23: qty 6.7

## 2013-10-23 MED ORDER — ALBUTEROL SULFATE (2.5 MG/3ML) 0.083% IN NEBU
5.0000 mg | INHALATION_SOLUTION | Freq: Once | RESPIRATORY_TRACT | Status: AC
  Administered 2013-10-23: 5 mg via RESPIRATORY_TRACT
  Filled 2013-10-23: qty 6

## 2013-10-23 NOTE — Discharge Instructions (Signed)
As discussed, pain control for rib fractures it is difficult.  Please take ibuprofen as directed, use the narcotics as needed, and use ice packs for additional pain relief. If you develop new, or concerning changes in your condition, return here, otherwise please follow up with a primary care physician for further evaluation and management.  Please use these provided resources to obtain a new physician.  Emergency Department Resource Guide 1) Find a Doctor and Pay Out of Pocket Although you won't have to find out who is covered by your insurance plan, it is a good idea to ask around and get recommendations. You will then need to call the office and see if the doctor you have chosen will accept you as a new patient and what types of options they offer for patients who are self-pay. Some doctors offer discounts or will set up payment plans for their patients who do not have insurance, but you will need to ask so you aren't surprised when you get to your appointment.  2) Contact Your Local Health Department Not all health departments have doctors that can see patients for sick visits, but many do, so it is worth a call to see if yours does. If you don't know where your local health department is, you can check in your phone book. The CDC also has a tool to help you locate your state's health department, and many state websites also have listings of all of their local health departments.  3) Find a Walk-in Clinic If your illness is not likely to be very severe or complicated, you may want to try a walk in clinic. These are popping up all over the country in pharmacies, drugstores, and shopping centers. They're usually staffed by nurse practitioners or physician assistants that have been trained to treat common illnesses and complaints. They're usually fairly quick and inexpensive. However, if you have serious medical issues or chronic medical problems, these are probably not your best option.  No Primary  Care Doctor: - Call Health Connect at  [phone] - they can help you locate a primary care doctor that  accepts your insurance, provides certain services, etc. - Physician Referral Service- [phone]  Chronic Pain Problems: Organization         Address  Phone   Notes  [facility]  [phone]) [phone] Patients need to be referred by their primary care doctor.   Medication Assistance: Organization         Address  Phone   Notes  [facility] Medication [facility] [address] [place], [place] [address] [phone]) [phone] --Must be a resident of [facility] -- Must have NO insurance coverage whatsoever (no Medicaid/ Medicare, etc.) -- The pt. MUST have a primary care doctor that directs their care regularly and follows them in the community   MedAssist  [phone]   [employer]  [phone]    Agencies that provide inexpensive medical care: Organization         Address  Phone   Notes  [facility]  [phone]   [facility]    [phone]) [phone]   [facility] [address] [place], [place] [address] [phone]) [phone]   [facility] [address], [place] [phone]) [phone]   Planned Parenthood    [phone]   [facility]    [phone]   [facility]  [address], [place] Phone:  [phone], Fax:  [phone]) [phone] Hours of Operation:  9 am - 6 pm, M-F.  Also accepts Medicaid/Medicare and self-pay.  [facility]  [address], [place] Phone: [phone], Fax: [phone]. Hours of Operation:  8:30 am - 5:30 pm, M-F.  Also accepts Medicaid and self-pay.  [facility] [place] [address], [place] Phone: [phone]) [phone]   [facility] [address], [place], [place] [phone], Ext. [phone] Mondays & Thursdays: 7-9 AM.  First 15 patients are seen on a first  come, first serve basis.    Medicaid-accepting [facility] Providers:  Organization         Address  Phone   Notes  [facility] [address], [place] [phone]) [phone] Also accepts self-pay patients.  [facility] [address], [place]  [phone]) [phone]   [facility] [address], [place] [phone]   [facility] [address] [place] [address], [place] [phone]   Renaye RakersVeita Bland [address], [place]   [phone]) [phone] Only accepts [place] Access [place] patients after they have their name applied to their card.   Self-Pay (no insurance) in [facility]:  Organization         Address  Phone   Notes  Sickle Cell Patients, [facility] [address], [place] [phone]) [phone]   [facility] [address], [place] [phone]) [phone]   [facility] [place]  [address], [place] [phone]) [phone]   [facility]/Dr. Osei-Bonsu  [address] [place] [address], [place] or [phone], [place] [phone] Phone number for both [place] and [place] locations is the same.  [facility] [address], [place] [phone]) [phone]   [facility]  [address] [place] [address], [place] or [address] [phone]) [phone]   [facility] [address], [place] [phone](336) [phone], phone; [phone]) [phone], fax Sees patients 1st and 3rd Saturday of every month.  Must not qualify for public or private insurance (i.e. Medicaid, Medicare, [place] Health Choice, Veterans' Benefits)  Household income should be no more than 200% of the poverty level The clinic cannot treat you if you are pregnant or think you are pregnant  Sexually transmitted diseases are not treated at the clinic.   Dental Care: Organization          Address  Phone  Notes  [facility] of [facility] [address], [place] [phone] Accepts children up to age 42 who are enrolled in [place] or [place] Health Choice; pregnant women with a Medicaid card; and children who have applied for Medicaid or [place] Health Choice, but were declined, whose parents can pay a reduced fee at time of service.  [facility] Department of [facility] [place]  [address], [place] [phone]) [phone] Accepts children up to age 10521 who are enrolled in [place] or [place] Health Choice; pregnant women with a Medicaid card; and children who have applied for Medicaid or [place] Health Choice, but were declined, whose parents can pay a reduced fee at time of service.  [facility]  [address], [place] [phone] Patients are seen by  appointment only. Walk-ins are not accepted. [place] will see patients 46 years of age and older. Monday - Tuesday (8am-5pm) Most Wednesdays (8:30-5pm) $30 per visit, cash only  [facility]  [address], [facility] [phone] Patients are seen by appointment only. Walk-ins are not accepted. [place] will see patients 56 years of age and older. One Wednesday Evening (Monthly: Volunteer Based).  $30 per visit, cash only  [place]  [phone] for adults; Children under age 81, call Graduate Pediatric Dentistry at [phone]. Children aged 36-14, please call [phone] to request a pediatric application.  Dental services are provided in all areas of dental care including fillings, crowns and bridges, complete and partial dentures, implants, gum treatment, root canals, and extractions. Preventive care is also provided. Treatment is provided to both adults and children. Patients are selected via a lottery and there is often a waiting list.   [facility] [address], [place]  [phone] [email or web address]   [facility] [address] [place], [place] [phone], Ext. [phone] Second and Fourth Thursday of each month, opens at 6:30 AM; Clinic ends at 9 AM.  Patients are seen on a first-come first-served basis, and a limited number are seen during each clinic.   [facility]  [address]ard Danker [place], [place] [phone]   Eligibility Requirements You must have lived in [place], [place], or [place] counties for at least the last three months.   You cannot be eligible for state or federal sponsored [employer], including [employer], [place], or [employer].   You generally cannot be eligible for healthcare insurance through your employer.    How to apply: Eligibility screenings are held every Tuesday and Wednesday afternoon from 1:00 pm until 4:00 pm. You do not need an appointment for the interview!  [facility] [address], [place]   [place]  [place] Department  [place]  [phone]    Behavioral Health Resources in the Community: Intensive Outpatient Programs Organization         Address  Phone  Notes  [place] Thayer. [address] [place], [place] [phone]   [facility] [address] [place], [place]   ADS: Alcohol & Drug Svcs [address], [place], [place]   [place] [address]  [place], [place] or [phone]   Substance Abuse Resources Organization         Address  Phone  Notes  Alcohol and Drug Services  [phone]   [place]  [phone]   The [place]   Chinita Pester  [phone]   Residential & Outpatient Substance Abuse Program  [phone]   Psychological  Services Organization         Address  Phone  Notes  [facility] [place]  [place]  [phone]   [place] [address], [place] [phone] or [phone]    Mobile Crisis Teams Organization         Address  Phone  Notes  [facility], Mobile Crisis Care Unit  [phone]   [facility]  [address] [place], [place]   [facility] [address], [place] [phone]  Self-Help/Support Groups Organization         Address  Phone             Notes  [facility] of [place] - variety of support groups  [phone]- I7437963 Call for more information  Narcotics Anonymous (NA), [facility] [address], [employer] [place]  2 meetings at this location   Statistician         Address  Phone  Notes  [facility] [address],    [place]  [phone]   [facility]  [address] [place] [address], [place], [place] [phone]   [facility] [address] [place], [place] [phone] Admissions: 8am-3pm M-F  [facility] [address]    [place], [place] [phone]   [facility] [address] [place], [place], [place] [phone]   [facility] [address]  [place], [place] [phone]   [facility] - Intensive Outpatient [address], [place], [place] [phone]   [facility] [address].,  [place], [place] [phone] or [phone]   [facility] (RTS) [address] [place], [place] [phone] Accepts Medicaid  [facility] [address]  [place] [phone] Substance Abuse/Addiction Treatment   [facility] Organization         Address  Phone  Notes  [facility]  [phone]   Angie Fava, PhD [address] [place] [place], [place]   [phone] or [phone]   [facility]   [address] [place], [place] [phone]   [facility] [address], [place], [place] [phone] Insurance/Medicaid/sponsorship through [facility] and Families [address]                                    [place], [place] [phone] Therapy/tele-psych/case  [facility] [address], [place] [phone]    Dr. Lolly Mustache  [phone]   [facility]  [employer] [facility] Dept. 1) [address] [place] 2) [address] [place] 3)  [address], [place] [phone]  [phone]   [facility] Child Abuse Hotline 7738798757 or [phone] (After Hours)

## 2013-10-23 NOTE — ED Provider Notes (Signed)
CSN: [id number]     Arrival date & time 10/23/13  1855 History   First MD Initiated Contact with Patient 10/23/13 2149     Chief Complaint  Patient presents with  . Chest Pain    HPI Patient presents with concerns of ongoing left-sided chest pain.  Pain began 10 days ago when the patient fell onto a concrete 4, sustained broken ribs. Patient was initially more comfortable with narcotics, but he no longer has any of this medication. His ongoing cough, note no fever, dyspnea, other chest pain, nausea, vomiting. Patient states that the pain is left-sided, nonradiating, sore, worse with coughing. Patient states that he self medicates with beer.  Past Medical History  Diagnosis Date  . Depression   . GERD (gastroesophageal reflux disease)   . Bipolar disorder    History reviewed. No pertinent past surgical history. Family History  Problem Relation Age of Onset  . Diabetes Mother   . Hyperlipidemia Father   . Hypertension Father   . Coronary artery disease Father     Stents placed  . Heart disease Maternal Grandmother   . Heart disease Paternal Grandmother    History  Substance Use Topics  . Smoking status: Current Every Day Smoker -- 1.50 packs/day  . Smokeless tobacco: Not on file  . Alcohol Use: Yes    Review of Systems  Constitutional:       Per HPI, otherwise negative  HENT:       Per HPI, otherwise negative  Respiratory:       Per HPI, otherwise negative  Cardiovascular:       Per HPI, otherwise negative  Gastrointestinal: Negative for vomiting.  Endocrine:       Negative aside from HPI  Genitourinary:       Neg aside from HPI   Musculoskeletal:       Per HPI, otherwise negative  Skin: Negative.   Neurological: Negative for syncope.      Allergies  Penicillins  Home Medications   Prior to Admission medications   Not on File   BP 132/74  Pulse 112  Temp(Src) 98.2 F (36.8 C) (Oral)  Resp 13  Wt 208 lb 3 oz (94.433 kg)  SpO2 95% Physical Exam   Nursing note and vitals reviewed. Constitutional: He is oriented to person, place, and time. He appears well-developed. No distress.  HENT:  Head: Normocephalic and atraumatic.  Eyes: Conjunctivae and EOM are normal.  Cardiovascular: Regular rhythm.  Tachycardia present.   Pulmonary/Chest: Effort normal. No stridor. No respiratory distress.  No gross deformity, shallow breathing.  Abdominal: He exhibits no distension.  Musculoskeletal: He exhibits no edema.  Neurological: He is alert and oriented to person, place, and time.  Skin: Skin is warm and dry.  Psychiatric: He has a normal mood and affect.    ED Course  Procedures (including critical care time) Labs Review Labs Reviewed  CBC - Abnormal; Notable for the following:    Platelets 423 (*)    All other components within normal limits  BASIC METABOLIC PANEL - Abnormal; Notable for the following:    Sodium 136 (*)    All other components within normal limits  I-STAT TROPOININ, ED    Imaging Review Dg Chest 2 View  10/23/2013   CLINICAL DATA:  Chest pain  EXAM: CHEST  2 VIEW  COMPARISON:  DG CHEST 2 VIEW dated 10/14/2013  FINDINGS: There is no focal parenchymal opacity, pleural effusion, or pneumothorax. The heart and mediastinal contours are  unremarkable.  Again noted are left posterior rib fractures involving the sixth, seventh and eighth ribs.  IMPRESSION: No active cardiopulmonary disease.  Left posterior rib fractures again noted.   Electronically Signed   By: Elige KoHetal  Patel   On: 10/23/2013 19:56     EKG Interpretation   Date/Time:  Wednesday Oct 23 2013 19:00:24 EDT Ventricular Rate:  122 PR Interval:  134 QRS Duration: 94 QT Interval:  336 QTC Calculation: 478 R Axis:   58 Text Interpretation:  Sinus tachycardia Otherwise normal ECG Sinus  tachycardia Abnormal ekg Confirmed by Gerhard MunchLOCKWOOD, Yeimi Debnam  MD ([id number]) on  10/23/2013 10:58:11 PM      MDM   Patient presents with ongoing pain following sustained rib fractures  from a 10 days ago. On exam patient is awake alert.  He is afebrile, though he is mildly tachycardic, he is in no distress, otherwise a stable. Patient's evaluation does not demonstrate pneumonia, no ongoing evidence for ischemia either. Patient was encouraged to use anti-inflammatories    provided additional analgesia, and instructed on consider sperm her use. Patient was provided resources for followup.  Gerhard Munchobert Shandell Jallow, MD 10/23/13 2259

## 2013-10-23 NOTE — ED Notes (Signed)
Pt reporting central cp for several days, has had vomiting. Has multiple rib fx from fall in April. Denies cardiac hx. Reports has been self medicating with beer. Pt is a x 4. Skin warm and dry.

## 2015-07-18 ENCOUNTER — Emergency Department (HOSPITAL_COMMUNITY)
Admission: EM | Admit: 2015-07-18 | Discharge: 2015-07-18 | Disposition: A | Discharge status: Home / Self Care | Payer: Self-pay | Attending: Emergency Medicine | Admitting: Emergency Medicine

## 2015-07-18 ENCOUNTER — Emergency Department (HOSPITAL_COMMUNITY): Payer: Self-pay

## 2015-07-18 ENCOUNTER — Encounter (HOSPITAL_COMMUNITY): Payer: Self-pay | Admitting: Emergency Medicine

## 2015-07-18 DIAGNOSIS — Z8719 Personal history of other diseases of the digestive system: Secondary | ICD-10-CM | POA: Insufficient documentation

## 2015-07-18 DIAGNOSIS — R51 Headache: Secondary | ICD-10-CM | POA: Insufficient documentation

## 2015-07-18 DIAGNOSIS — Z8659 Personal history of other mental and behavioral disorders: Secondary | ICD-10-CM | POA: Insufficient documentation

## 2015-07-18 DIAGNOSIS — F17200 Nicotine dependence, unspecified, uncomplicated: Secondary | ICD-10-CM | POA: Insufficient documentation

## 2015-07-18 DIAGNOSIS — R519 Headache, unspecified: Secondary | ICD-10-CM

## 2015-07-18 DIAGNOSIS — H9312 Tinnitus, left ear: Secondary | ICD-10-CM | POA: Insufficient documentation

## 2015-07-18 DIAGNOSIS — H539 Unspecified visual disturbance: Secondary | ICD-10-CM | POA: Insufficient documentation

## 2015-07-18 DIAGNOSIS — H6121 Impacted cerumen, right ear: Secondary | ICD-10-CM | POA: Insufficient documentation

## 2015-07-18 DIAGNOSIS — Z880 Allergy status to penicillin: Secondary | ICD-10-CM | POA: Insufficient documentation

## 2015-07-18 LAB — I-STAT CHEM 8, ED
BUN: 7 mg/dL (ref 6–20)
Calcium, Ion: 1.22 mmol/L (ref 1.12–1.23)
Chloride: 102 mmol/L (ref 101–111)
Creatinine, Ser: 0.6 mg/dL — ABNORMAL LOW (ref 0.61–1.24)
Glucose, Bld: 99 mg/dL (ref 65–99)
HCT: 51 % (ref 39.0–52.0)
Hemoglobin: 17.3 g/dL — ABNORMAL HIGH (ref 13.0–17.0)
Potassium: 4.2 mmol/L (ref 3.5–5.1)
Sodium: 139 mmol/L (ref 135–145)
TCO2: 24 mmol/L (ref 0–100)

## 2015-07-18 LAB — CBC
HCT: 46.8 % (ref 39.0–52.0)
Hemoglobin: 15.6 g/dL (ref 13.0–17.0)
MCH: 32.4 pg (ref 26.0–34.0)
MCHC: 33.3 g/dL (ref 30.0–36.0)
MCV: 97.3 fL (ref 78.0–100.0)
Platelets: 290 10*3/uL (ref 150–400)
RBC: 4.81 MIL/uL (ref 4.22–5.81)
RDW: 13.2 % (ref 11.5–15.5)
WBC: 8.9 10*3/uL (ref 4.0–10.5)

## 2015-07-18 LAB — COMPREHENSIVE METABOLIC PANEL
ALT: 29 U/L (ref 17–63)
AST: 26 U/L (ref 15–41)
Albumin: 4.4 g/dL (ref 3.5–5.0)
Alkaline Phosphatase: 66 U/L (ref 38–126)
Anion gap: 10 (ref 5–15)
BUN: 7 mg/dL (ref 6–20)
CO2: 23 mmol/L (ref 22–32)
Calcium: 9.8 mg/dL (ref 8.9–10.3)
Chloride: 104 mmol/L (ref 101–111)
Creatinine, Ser: 0.75 mg/dL (ref 0.61–1.24)
GFR calc Af Amer: >60 mL/min (ref >60)
GFR calc non Af Amer: >60 mL/min (ref >60)
Glucose, Bld: 110 mg/dL — ABNORMAL HIGH (ref 65–99)
Potassium: 4.6 mmol/L (ref 3.5–5.1)
Sodium: 137 mmol/L (ref 135–145)
Total Bilirubin: 0.7 mg/dL (ref 0.3–1.2)
Total Protein: 7.4 g/dL (ref 6.5–8.1)

## 2015-07-18 LAB — DIFFERENTIAL
Basophils Absolute: 0.1 10*3/uL (ref 0.0–0.1)
Basophils Relative: 1 %
Eosinophils Absolute: 0.3 10*3/uL (ref 0.0–0.7)
Eosinophils Relative: 3 %
Lymphocytes Relative: 21 %
Lymphs Abs: 1.9 10*3/uL (ref 0.7–4.0)
Monocytes Absolute: 0.9 10*3/uL (ref 0.1–1.0)
Monocytes Relative: 10 %
Neutro Abs: 5.9 10*3/uL (ref 1.7–7.7)
Neutrophils Relative %: 65 %

## 2015-07-18 LAB — I-STAT TROPONIN, ED: Troponin i, poc: 0 ng/mL (ref 0.00–0.08)

## 2015-07-18 LAB — CBG MONITORING, ED: Glucose-Capillary: 97 mg/dL (ref 65–99)

## 2015-07-18 MED ORDER — DIPHENHYDRAMINE HCL 50 MG/ML IJ SOLN
25.0000 mg | Freq: Once | INTRAMUSCULAR | Status: AC
  Administered 2015-07-18: 25 mg via INTRAVENOUS
  Filled 2015-07-18: qty 1

## 2015-07-18 MED ORDER — METOCLOPRAMIDE HCL 5 MG/ML IJ SOLN
10.0000 mg | Freq: Once | INTRAMUSCULAR | Status: AC
  Administered 2015-07-18: 10 mg via INTRAVENOUS
  Filled 2015-07-18: qty 2

## 2015-07-18 MED ORDER — DEXAMETHASONE SODIUM PHOSPHATE 10 MG/ML IJ SOLN
10.0000 mg | Freq: Once | INTRAMUSCULAR | Status: AC
  Administered 2015-07-18: 10 mg via INTRAVENOUS
  Filled 2015-07-18: qty 1

## 2015-07-18 NOTE — ED Notes (Signed)
Pt verbalized understanding to follow up with neurologist. Pt stable and NAD.

## 2015-07-18 NOTE — ED Notes (Signed)
CHECKED CBG 97, RN BROOKE INFORMED

## 2015-07-18 NOTE — ED Provider Notes (Signed)
CSN: [id number]     Arrival date & time 07/18/15  1611 History   First MD Initiated Contact with Patient 07/18/15 1640     Chief Complaint  Patient presents with  . Otalgia  . Loss of Vision     (Consider location/radiation/quality/duration/timing/severity/associated sxs/prior Treatment) HPI Comments: Patient presents today with several complaints.  He states that he has had ringing and decreased hearing in his left ear for the past month.  He states that the ringing has been constant.  He reports that he has also had intermittent flashes of light in his vision over the past couple of months.  He states that three days ago his vision went black in both eyes, which lasted for a couple of seconds and then resolved.  This episode occurred while he was doing work in the crawl space of a house.  He does report some associated dizziness with this episode.  He is unable to clarify if the dizziness was vertigo or was lightheadedness.  He also reports that he has had an intermittent headache over the past year.  He states that the headache is bilateral.  He takes Ibuprofen for the headache, which only provides mild relief.  He denies syncope, nausea, vomiting, fever, chills, CP, or SOB.    Patient is a 45 y.o. male presenting with ear pain. The history is provided by the patient.  Otalgia   Past Medical History  Diagnosis Date  . Depression   . GERD (gastroesophageal reflux disease)   . Bipolar disorder (HCC)    History reviewed. No pertinent past surgical history. Family History  Problem Relation Age of Onset  . Diabetes Mother   . Hyperlipidemia Father   . Hypertension Father   . Coronary artery disease Father     Stents placed  . Heart disease Maternal Grandmother   . Heart disease Paternal Grandmother    Social History  Substance Use Topics  . Smoking status: Current Every Day Smoker -- 1.50 packs/day  . Smokeless tobacco: None  . Alcohol Use: Yes    Review of Systems  HENT:  Positive for ear pain.   All other systems reviewed and are negative.     Allergies  Other and Penicillins  Home Medications   Prior to Admission medications   Medication Sig Start Date End Date Taking? Authorizing Provider  acetaminophen (TYLENOL) 500 MG tablet Take 1,000 mg by mouth every 6 (six) hours as needed for moderate pain.   Yes Historical Provider, MD  ibuprofen (ADVIL,MOTRIN) 200 MG tablet Take 200 mg by mouth every 6 (six) hours as needed for moderate pain.   Yes Historical Provider, MD  HYDROmorphone (DILAUDID) 2 MG tablet Take 0.5 tablets (1 mg total) by mouth every 6 (six) hours as needed for severe pain. 10/23/13   Gerhard Munch, MD   BP 133/98 mmHg  Pulse 78  Temp(Src) 98.3 F (36.8 C) (Oral)  Resp 19  Ht  (1.778 m)  Wt 89.954 kg  BMI 28.45 kg/m2  SpO2 96% Physical Exam  Constitutional: He appears well-developed and well-nourished.  HENT:  Head: Normocephalic and atraumatic.  Left Ear: Tympanic membrane and ear canal normal.  Mouth/Throat: Oropharynx is clear and moist.  Cerumen impaction of the right ear  Eyes: EOM are normal. Pupils are equal, round, and reactive to light.  Neck: Normal range of motion. Neck supple.  Cardiovascular: Normal rate, regular rhythm and normal heart sounds.   Pulmonary/Chest: Effort normal and breath sounds normal.  Musculoskeletal: Normal  range of motion.  Neurological: He is alert. He has normal strength. No cranial nerve deficit or sensory deficit. Coordination and gait normal.  Skin: Skin is warm and dry.  Psychiatric: He has a normal mood and affect.  Nursing note and vitals reviewed.   ED Course  Procedures (including critical care time) Labs Review Labs Reviewed  COMPREHENSIVE METABOLIC PANEL - Abnormal; Notable for the following:    Glucose, Bld 110 (*)    All other components within normal limits  I-STAT CHEM 8, ED - Abnormal; Notable for the following:    Creatinine, Ser 0.60 (*)    Hemoglobin 17.3  (*)    All other components within normal limits  CBC  DIFFERENTIAL  I-STAT TROPOININ, ED  CBG MONITORING, ED    Imaging Review Ct Head Wo Contrast  07/18/2015  CLINICAL DATA:  Patient with left-sided her ring and pain for 1 month. Intermittent vision loss for 1 week. Dizziness. EXAM: CT HEAD WITHOUT CONTRAST TECHNIQUE: Contiguous axial images were obtained from the base of the skull through the vertex without intravenous contrast. COMPARISON:  CT brain 09/22/2013 FINDINGS: Ventricles and sulci are appropriate for patient's age. No evidence for acute cortically based infarct, intracranial hemorrhage, mass lesion or mass-effect. Orbits are unremarkable. Mucosal thickening involving the sphenoid sinus and ethmoid air cells. Mastoid air cells are unremarkable. The calvarium is intact. IMPRESSION: No acute intracranial process. Paranasal sinus mucosal thickening. Electronically Signed   By: Annia Belt M.D.   On: 07/18/2015 20:42   I have personally reviewed and evaluated these images and lab results as part of my medical decision-making.   EKG Interpretation None     9:04 PM Reassessed patient.  He reports that his headache has improved at this time. MDM   Final diagnoses:  None   Patient presents today with complaints of headache for the past year, tinnitus for the past month, and intermittent flashes of light for the past few months.  CT head is negative.  Headache improved in the ED.  Labs unremarkable.  Feel that the patient is stable for discharge.  Patient given referral to Neurology.  Due to the fact that symptoms are chronic, do not feel that any additional work up is indicated emergently.  Return precautions given.  Patient discussed with Dr. Jeraldine Loots who is in agreement with the plan.    Santiago Glad, PA-C 07/20/15 1023  Gerhard Munch, MD 07/22/15 [phone]

## 2015-07-18 NOTE — ED Notes (Signed)
Pt c/o ear ringing and pain x 1 month. Pt has tried peroxide without relief. Pt reports numbness to left side of head x 1 week. Pt speech clear, tongue midline, equal grips, no arm drift. Pt also reports intermittent vision loss for past few days.

## 2015-10-08 ENCOUNTER — Encounter (HOSPITAL_COMMUNITY): Payer: Self-pay | Admitting: Emergency Medicine

## 2015-10-08 ENCOUNTER — Emergency Department (HOSPITAL_COMMUNITY): Payer: Self-pay

## 2015-10-08 ENCOUNTER — Emergency Department (HOSPITAL_COMMUNITY)
Admission: EM | Admit: 2015-10-08 | Discharge: 2015-10-08 | Disposition: A | Discharge status: Home / Self Care | Payer: Self-pay | Attending: Emergency Medicine | Admitting: Emergency Medicine

## 2015-10-08 DIAGNOSIS — Y9389 Activity, other specified: Secondary | ICD-10-CM | POA: Insufficient documentation

## 2015-10-08 DIAGNOSIS — Y9289 Other specified places as the place of occurrence of the external cause: Secondary | ICD-10-CM | POA: Insufficient documentation

## 2015-10-08 DIAGNOSIS — Z8719 Personal history of other diseases of the digestive system: Secondary | ICD-10-CM | POA: Insufficient documentation

## 2015-10-08 DIAGNOSIS — W010XXA Fall on same level from slipping, tripping and stumbling without subsequent striking against object, initial encounter: Secondary | ICD-10-CM | POA: Insufficient documentation

## 2015-10-08 DIAGNOSIS — S8011XA Contusion of right lower leg, initial encounter: Secondary | ICD-10-CM | POA: Insufficient documentation

## 2015-10-08 DIAGNOSIS — Z880 Allergy status to penicillin: Secondary | ICD-10-CM | POA: Insufficient documentation

## 2015-10-08 DIAGNOSIS — Z8659 Personal history of other mental and behavioral disorders: Secondary | ICD-10-CM | POA: Insufficient documentation

## 2015-10-08 DIAGNOSIS — Y998 Other external cause status: Secondary | ICD-10-CM | POA: Insufficient documentation

## 2015-10-08 DIAGNOSIS — F17210 Nicotine dependence, cigarettes, uncomplicated: Secondary | ICD-10-CM | POA: Insufficient documentation

## 2015-10-08 DIAGNOSIS — S8001XA Contusion of right knee, initial encounter: Secondary | ICD-10-CM | POA: Insufficient documentation

## 2015-10-08 MED ORDER — IBUPROFEN 400 MG PO TABS
600.0000 mg | ORAL_TABLET | Freq: Once | ORAL | Status: AC
  Administered 2015-10-08: 600 mg via ORAL
  Filled 2015-10-08: qty 1

## 2015-10-08 MED ORDER — IBUPROFEN 400 MG PO TABS: 400.0000 mg | ORAL_TABLET | Freq: Four times a day (QID) | ORAL | Status: DC | PRN

## 2015-10-08 NOTE — ED Notes (Signed)
Pt is in stable condition upon d/c and is escorted from ED via wheelchair. 

## 2015-10-08 NOTE — Discharge Instructions (Signed)
Contusion °A contusion is a deep bruise. Contusions are the result of a blunt injury to tissues and muscle fibers under the skin. The injury causes bleeding under the skin. The skin overlying the contusion may turn blue, purple, or yellow. Minor injuries will give you a painless contusion, but more severe contusions may stay painful and swollen for a few weeks.  °CAUSES  °This condition is usually caused by a blow, trauma, or direct force to an area of the body. °SYMPTOMS  °Symptoms of this condition include: °· Swelling of the injured area. °· Pain and tenderness in the injured area. °· Discoloration. The area may have redness and then turn blue, purple, or yellow. °DIAGNOSIS  °This condition is diagnosed based on a physical exam and medical history. An X-ray, CT scan, or MRI may be needed to determine if there are any associated injuries, such as broken bones (fractures). °TREATMENT  °Specific treatment for this condition depends on what area of the body was injured. In general, the best treatment for a contusion is resting, icing, applying pressure to (compression), and elevating the injured area. This is often called the RICE strategy. Over-the-counter anti-inflammatory medicines may also be recommended for pain control.  °HOME CARE INSTRUCTIONS  °· Rest the injured area. °· If directed, apply ice to the injured area: °¨ Put ice in a plastic bag. °¨ Place a towel between your skin and the bag. °¨ Leave the ice on for 20 minutes, 2-3 times per day. °· If directed, apply light compression to the injured area using an elastic bandage. Make sure the bandage is not wrapped too tightly. Remove and reapply the bandage as directed by your health care provider. °· If possible, raise (elevate) the injured area above the level of your heart while you are sitting or lying down. °· Take over-the-counter and prescription medicines only as told by your health care provider. °SEEK MEDICAL CARE IF: °· Your symptoms do not  improve after several days of treatment. °· Your symptoms get worse. °· You have difficulty moving the injured area. °SEEK IMMEDIATE MEDICAL CARE IF:  °· You have severe pain. °· You have numbness in a hand or foot. °· Your hand or foot turns pale or cold. °  °This information is not intended to replace advice given to you by your health care provider. Make sure you discuss any questions you have with your health care provider. °  °Document Released: 03/16/2005 Document Revised: 02/25/2015 Document Reviewed: 10/22/2014 °Elsevier Interactive Patient Education ©2016 [employer] ° °Periosteal Hematoma °Periosteal hematoma (bone bruise) is a localized, tender, raised area close to the bone. It can occur from a small hidden fracture of the bone, following surgery, or from other trauma to the area. It typically occurs in bones located close to the surface of the skin, such as the shin, knee, and heel bone. Although it may take 2 or more weeks to completely heal, bone bruises typically are not associated with permanent or serious damage to the bone. If you are taking blood thinners, you may be at greater risk for such injuries.  °CAUSES  °A bone bruise is usually caused by high-impact trauma to the bone, but it can be caused by sports injuries or twisting injuries. °SIGNS AND SYMPTOMS  °· Severe pain around the injured area that typically lasts longer than a normal bruise. °· Difficulty using the bruised area. °· Tender, raised area close to the bone. °· Discoloration or swelling of the bruised area. °DIAGNOSIS  °You   may need an MRI of the injured area to confirm a bone bruise if your health care provider feels it is necessary. A regular X-ray will not detect a bone bruise, but it will detect a broken bone (fracture). An X-ray may be taken to rule out any fractures. °TREATMENT  °Often, the best treatment for a bone bruise is resting, icing, and applying cold compresses to the injured area. Over-the-counter medicines may  also be recommended for pain control. °HOME CARE INSTRUCTIONS  °Some things you can do to improve the condition are:  °· Rest and elevate the area of injury as long as it is very tender or swollen. °· Apply ice to the injured area: °¨ Put ice in a plastic bag. °¨ Place a towel between your skin and the bag. °¨ Leave the ice on for 20 minutes, 2-3 times a day. °· Use an elastic wrap to reduce swelling and protect the injured area. Make sure it is not applied too tightly. If the area around the wrap becomes cold or blue, the wrap is too tight. Wrap it more loosely. °· For activity: °¨ Follow your health care provider's instructions about whether walking with crutches is required. This will depend on how serious your condition is. °¨ Start weight bearing gradually on the bruised part. °¨ Continue to use crutches or a cane until you can stand without causing pain, or as instructed. °· If a plaster splint was applied: °¨ Wear the splint until you are seen for a follow-up exam. °¨ Rest it on nothing harder than a pillow the first 24 hours. °¨ Do not put weight on it. °¨ Do not get it wet. You may take it off to take a shower or bath. °· You may have been given an elastic bandage to use with or without the plaster splint. The splint is too tight if you have numbness or tingling, or if the skin around the bandage becomes cold and blue. Adjust the bandage to make it comfortable. °· If an air splint was applied: °¨ You may alter the amount of air in the splint as needed for comfort. °¨ You may take it off at night and to take a shower or bath. °· If the injury was in either leg, wiggle your toes in the splint several times per day if you are able. °· Only take over-the-counter or prescription medicines for pain, discomfort, or fever as directed by your health care provider. °· Keep all follow-up visits with your health care provider. This includes any orthopedic referrals, physical therapy, and rehabilitation. Any delay in  getting necessary care could result in a delay or failure of the bones to heal. °SEEK MEDICAL CARE IF:  °· You have an increase in bruising, swelling, tenderness, heat, or pain over your injury. °· You notice coldness of your toes that does not improve after removing a splint or bandage. °· Your pain is not lessened after you take medicine. °· You have increased difficulty bearing weight on the injured leg, if the injury is in either leg. °SEEK IMMEDIATE MEDICAL CARE IF:  °· You have severe pain near the injured area or severe pain with stretching. °· You have increased swelling that resulted in a tense, hard area or a loss of sensation in the area of the injury. °· You have pale, cool skin below the area of the injury (in an extremity) that does not go away after removing a splint or bandage. °MAKE SURE YOU:  °· Understand these   instructions. °· Will watch your condition. °· Will get help right away if you are not doing well or get worse. °  °This information is not intended to replace advice given to you by your health care provider. Make sure you discuss any questions you have with your health care provider. °  °Document Released: 07/14/2004 Document Revised: 03/27/2013 Document Reviewed: 11/23/2012 °Elsevier Interactive Patient Education ©2016 Elsevier Inc. ° °

## 2015-10-08 NOTE — ED Notes (Signed)
Patient states tripped 3 days ago and landed on his R knee.   Patient has swelling and pain to knee down to feet.

## 2015-10-08 NOTE — Progress Notes (Signed)
Orthopedic Tech Progress Note Patient Details:  Raylene MiyamotoFloyd T Balster 09/04/1970 [id number]  Ortho Devices Type of Ortho Device: Ace wrap, Crutches Ortho Device/Splint Location: RLE Ortho Device/Splint Interventions: Ordered, Application   Jennye MoccasinHughes, Nieshia Larmon Craig 10/08/2015, 3:43 PM

## 2015-10-08 NOTE — ED Provider Notes (Signed)
CSN: [id number]     Arrival date & time 10/08/15  1103 History   First MD Initiated Contact with Patient 10/08/15 1517     Chief Complaint  Patient presents with  . Knee Injury   HPI Patient presents to the emergency room with complaints of right knee pain. Dustin Olsen tripped and his right knee landed on the concrete. He noticed an area of swelling immediately below the knee. He has placed ice on the area with some improvement of the swelling however he continues to have pain and tenderness. He also has noticed that the leg is feeling more swollen overall. He denies any trouble with fevers or chills. No shortness of breath. Past Medical History  Diagnosis Date  . Depression   . GERD (gastroesophageal reflux disease)   . Bipolar disorder (HCC)    History reviewed. No pertinent past surgical history. Family History  Problem Relation Age of Onset  . Diabetes Mother   . Hyperlipidemia Father   . Hypertension Father   . Coronary artery disease Father     Stents placed  . Heart disease Maternal Grandmother   . Heart disease Paternal Grandmother    Social History  Substance Use Topics  . Smoking status: Current Every Day Smoker -- 1.50 packs/day    Types: Cigarettes  . Smokeless tobacco: None  . Alcohol Use: Yes    Review of Systems  All other systems reviewed and are negative.     Allergies  Other and Penicillins  Home Medications   Prior to Admission medications   Medication Sig Start Date End Date Taking? Authorizing Provider  acetaminophen (TYLENOL) 500 MG tablet Take 1,000 mg by mouth every 6 (six) hours as needed for moderate pain.    Historical Provider, MD  HYDROmorphone (DILAUDID) 2 MG tablet Take 0.5 tablets (1 mg total) by mouth every 6 (six) hours as needed for severe pain. 10/23/13   Dustin Munch, MD  ibuprofen (ADVIL,MOTRIN) 400 MG tablet Take 1 tablet (400 mg total) by mouth every 6 (six) hours as needed for moderate pain. 10/08/15   Dustin Dibbles, MD   BP 120/75  mmHg  Pulse 88  Temp(Src) 98.7 F (37.1 C) (Oral)  Resp 16  Ht  (1.778 m)  Wt 91.445 kg  BMI 28.93 kg/m2  SpO2 92% Physical Exam  Constitutional: He appears well-developed and well-nourished. No distress.  HENT:  Head: Normocephalic and atraumatic.  Right Ear: External ear normal.  Left Ear: External ear normal.  Eyes: Conjunctivae are normal. Right eye exhibits no discharge. Left eye exhibits no discharge. No scleral icterus.  Neck: Neck supple. No tracheal deviation present.  Cardiovascular: Normal rate.   Pulmonary/Chest: Effort normal. No stridor. No respiratory distress.  Musculoskeletal: He exhibits edema and tenderness.       Right knee: He exhibits swelling. He exhibits no effusion, no deformity and no laceration. Tenderness found.       Legs: Area of edema and tenderness overlying the anterior tibial tuberosity, distal neurovascularly intact, apartment's are soft; patient is able to hold his leg extended off the bed  Neurological: He is alert. Cranial nerve deficit: no gross deficits.  Skin: Skin is warm and dry. No rash noted.  Psychiatric: He has a normal mood and affect.  Nursing note and vitals reviewed.   ED Course  Procedures (including critical care time) Labs Review Labs Reviewed - No data to display  Imaging Review Dg Knee Complete 4 Views Right  10/08/2015  CLINICAL DATA:  Acute right knee pain and swelling after fall on steps 3 days ago. Initial encounter. EXAM: RIGHT KNEE - COMPLETE 4+ VIEW COMPARISON:  None. FINDINGS: There is no evidence of fracture, dislocation, or joint effusion. There is no evidence of arthropathy or other focal bone abnormality. Soft tissues are unremarkable. IMPRESSION: Normal right knee. Electronically Signed   By: Lupita RaiderJames  Green Jr, M.D.   On: 10/08/2015 12:20   I have personally reviewed and evaluated these images and lab results as part of my medical decision-making.   EKG Interpretation None      MDM   Final  diagnoses:  Contusion of knee and lower leg, right, initial encounter    Patient has a contusion of his lower extremity. I suspect the swelling is related to the bruising and a periosteal hematoma.  No sign of fracture or dislocation. I doubt DVT as the swelling occurred immediately after a fall and the patient has localized swelling at the site of injury.  We'll discharge home with crutches and an Ace wrap. NSAIDs and ice. Follow up with orthopedics as necessary    Dustin DibblesJon Shavon Zenz, MD 10/08/15 1528

## 2016-05-17 ENCOUNTER — Emergency Department (HOSPITAL_COMMUNITY): Payer: Self-pay

## 2016-05-17 ENCOUNTER — Encounter (HOSPITAL_COMMUNITY): Payer: Self-pay

## 2016-05-17 ENCOUNTER — Emergency Department (HOSPITAL_COMMUNITY)
Admission: EM | Admit: 2016-05-17 | Discharge: 2016-05-17 | Discharge status: Against Medical Advice | Payer: Self-pay | Attending: Emergency Medicine | Admitting: Emergency Medicine

## 2016-05-17 DIAGNOSIS — Z720 Tobacco use: Secondary | ICD-10-CM

## 2016-05-17 DIAGNOSIS — J40 Bronchitis, not specified as acute or chronic: Secondary | ICD-10-CM | POA: Insufficient documentation

## 2016-05-17 DIAGNOSIS — I214 Non-ST elevation (NSTEMI) myocardial infarction: Secondary | ICD-10-CM | POA: Insufficient documentation

## 2016-05-17 DIAGNOSIS — Z7982 Long term (current) use of aspirin: Secondary | ICD-10-CM | POA: Insufficient documentation

## 2016-05-17 DIAGNOSIS — Z8249 Family history of ischemic heart disease and other diseases of the circulatory system: Secondary | ICD-10-CM

## 2016-05-17 DIAGNOSIS — F17210 Nicotine dependence, cigarettes, uncomplicated: Secondary | ICD-10-CM | POA: Insufficient documentation

## 2016-05-17 DIAGNOSIS — R072 Precordial pain: Secondary | ICD-10-CM

## 2016-05-17 DIAGNOSIS — R079 Chest pain, unspecified: Secondary | ICD-10-CM

## 2016-05-17 LAB — I-STAT TROPONIN, ED: Troponin i, poc: 0 ng/mL (ref 0.00–0.08)

## 2016-05-17 LAB — BASIC METABOLIC PANEL
Anion gap: 8 (ref 5–15)
BUN: <5 mg/dL — ABNORMAL LOW (ref 6–20)
CO2: 24 mmol/L (ref 22–32)
Calcium: 9.4 mg/dL (ref 8.9–10.3)
Chloride: 102 mmol/L (ref 101–111)
Creatinine, Ser: 0.74 mg/dL (ref 0.61–1.24)
GFR calc Af Amer: >60 mL/min (ref >60)
GFR calc non Af Amer: >60 mL/min (ref >60)
Glucose, Bld: 110 mg/dL — ABNORMAL HIGH (ref 65–99)
Potassium: 4.5 mmol/L (ref 3.5–5.1)
Sodium: 134 mmol/L — ABNORMAL LOW (ref 135–145)

## 2016-05-17 LAB — CBC
HCT: 46.3 % (ref 39.0–52.0)
Hemoglobin: 15.9 g/dL (ref 13.0–17.0)
MCH: 32.9 pg (ref 26.0–34.0)
MCHC: 34.3 g/dL (ref 30.0–36.0)
MCV: 95.7 fL (ref 78.0–100.0)
Platelets: 243 10*3/uL (ref 150–400)
RBC: 4.84 MIL/uL (ref 4.22–5.81)
RDW: 12.9 % (ref 11.5–15.5)
WBC: 18.2 10*3/uL — ABNORMAL HIGH (ref 4.0–10.5)

## 2016-05-17 LAB — TROPONIN I: Troponin I: 0.04 ng/mL (ref <0.03)

## 2016-05-17 MED ORDER — ADULT MULTIVITAMIN W/MINERALS CH: 1.0000 | ORAL_TABLET | Freq: Every day | ORAL | Status: DC

## 2016-05-17 MED ORDER — LORAZEPAM 1 MG PO TABS: 1.0000 mg | ORAL_TABLET | Freq: Four times a day (QID) | ORAL | Status: DC | PRN

## 2016-05-17 MED ORDER — DEXTROSE 5 % IV SOLN
500.0000 mg | Freq: Once | INTRAVENOUS | Status: AC
  Administered 2016-05-17: 500 mg via INTRAVENOUS
  Filled 2016-05-17: qty 500

## 2016-05-17 MED ORDER — PREDNISONE 20 MG PO TABS: ORAL_TABLET | ORAL | 0 refills | Status: DC

## 2016-05-17 MED ORDER — ASPIRIN 325 MG PO TABS
325.0000 mg | ORAL_TABLET | Freq: Once | ORAL | Status: AC
  Administered 2016-05-17: 325 mg via ORAL
  Filled 2016-05-17: qty 1

## 2016-05-17 MED ORDER — BENZONATATE 100 MG PO CAPS: 100.0000 mg | ORAL_CAPSULE | Freq: Three times a day (TID) | ORAL | 0 refills | Status: DC

## 2016-05-17 MED ORDER — THIAMINE HCL 100 MG/ML IJ SOLN: 100.0000 mg | Freq: Every day | INTRAMUSCULAR | Status: DC

## 2016-05-17 MED ORDER — IBUPROFEN 800 MG PO TABS
800.0000 mg | ORAL_TABLET | Freq: Once | ORAL | Status: AC
  Administered 2016-05-17: 800 mg via ORAL
  Filled 2016-05-17: qty 1

## 2016-05-17 MED ORDER — SODIUM CHLORIDE 0.9 % IV BOLUS (SEPSIS)
1000.0000 mL | Freq: Once | INTRAVENOUS | Status: AC
  Administered 2016-05-17: 1000 mL via INTRAVENOUS

## 2016-05-17 MED ORDER — BENZONATATE 100 MG PO CAPS
100.0000 mg | ORAL_CAPSULE | Freq: Once | ORAL | Status: AC
  Administered 2016-05-17: 100 mg via ORAL
  Filled 2016-05-17: qty 1

## 2016-05-17 MED ORDER — FOLIC ACID 1 MG PO TABS: 1.0000 mg | ORAL_TABLET | Freq: Every day | ORAL | Status: DC

## 2016-05-17 MED ORDER — VITAMIN B-1 100 MG PO TABS: 100.0000 mg | ORAL_TABLET | Freq: Every day | ORAL | Status: DC

## 2016-05-17 MED ORDER — LORAZEPAM 2 MG/ML IJ SOLN
1.0000 mg | Freq: Four times a day (QID) | INTRAMUSCULAR | Status: DC | PRN
Start: 2016-05-17 — End: 2016-05-17

## 2016-05-17 MED ORDER — LORAZEPAM 1 MG PO TABS: 1.0000 mg | ORAL_TABLET | Freq: Once | ORAL | Status: DC

## 2016-05-17 MED ORDER — AZITHROMYCIN 250 MG PO TABS
500.0000 mg | ORAL_TABLET | Freq: Once | ORAL | Status: AC
  Administered 2016-05-17: 500 mg via ORAL
  Filled 2016-05-17: qty 2

## 2016-05-17 MED ORDER — IPRATROPIUM-ALBUTEROL 0.5-2.5 (3) MG/3ML IN SOLN
3.0000 mL | Freq: Once | RESPIRATORY_TRACT | Status: AC
  Administered 2016-05-17: 3 mL via RESPIRATORY_TRACT
  Filled 2016-05-17: qty 3

## 2016-05-17 MED ORDER — ALBUTEROL SULFATE HFA 108 (90 BASE) MCG/ACT IN AERS
2.0000 | INHALATION_SPRAY | Freq: Once | RESPIRATORY_TRACT | Status: AC
  Administered 2016-05-17: 2 via RESPIRATORY_TRACT
  Filled 2016-05-17: qty 6.7

## 2016-05-17 MED ORDER — AZITHROMYCIN 250 MG PO TABS: 250.0000 mg | ORAL_TABLET | Freq: Every day | ORAL | 0 refills | Status: DC

## 2016-05-17 MED ORDER — METHYLPREDNISOLONE SODIUM SUCC 125 MG IJ SOLR
125.0000 mg | Freq: Once | INTRAMUSCULAR | Status: AC
  Administered 2016-05-17: 125 mg via INTRAVENOUS
  Filled 2016-05-17: qty 2

## 2016-05-17 MED ORDER — ASPIRIN EC 81 MG PO TBEC: 81.0000 mg | DELAYED_RELEASE_TABLET | Freq: Every day | ORAL | 1 refills | Status: DC

## 2016-05-17 NOTE — ED Notes (Signed)
Awaiting edp to talk with patient regarding troponin level, patient and family updated

## 2016-05-17 NOTE — ED Notes (Signed)
Patient reports that he cannot tolerate his azithromycin via iv due to burning again, edp notified and medication discontinued and changed to po

## 2016-05-17 NOTE — ED Notes (Signed)
Awaiting cardiology, patient updated

## 2016-05-17 NOTE — Discharge Planning (Signed)
EDCM consulted to assist with getting uninsured pt PCP appointment and orange card application.  EDCM called [facility] and they don't have any appointments at this time.  EDCM called [facility] to get the earliest appointment on 12/28.  EDCM will encourage pt to call to try and get a cancelled appointment as pt needs to be seen sooner than later.  [facility]

## 2016-05-17 NOTE — ED Notes (Signed)
ED Provider at bedside. 

## 2016-05-17 NOTE — ED Notes (Signed)
Patient ambulated to bathroom and assisted into chair at bedside for comfort

## 2016-05-17 NOTE — ED Notes (Signed)
Patient has decided to not stay in the hospital after talking to him about the risks, waiting for case manager to come speak with him prior to discharge

## 2016-05-17 NOTE — ED Notes (Signed)
Cardiology at bedside.

## 2016-05-17 NOTE — ED Notes (Signed)
Patient states Azithromycin was burning above the site. Patient denies any increasing SOB, no hives or redness noted. MD made aware. Advised to slow medication down. Rate now 150/hr

## 2016-05-17 NOTE — ED Notes (Signed)
Patient states that he would like to leave AMA, EDP at bedside

## 2016-05-17 NOTE — ED Notes (Signed)
Patient tolerating po fluids and food

## 2016-05-17 NOTE — ED Provider Notes (Signed)
[facility] DEPT Provider Note   CSN: [id number] Arrival date & time: 05/17/16  [id number]     History   Chief Complaint Chief Complaint  Patient presents with  . Chest Pain    HPI Dustin Olsen is a 45 y.o. male.   Chest Pain   This is a new problem. The current episode started more than 2 days ago. The problem occurs constantly. The pain is associated with coughing. The pain is present in the substernal region. The pain is moderate. The quality of the pain is described as brief, pressure-like, stabbing and sharp. The pain radiates to the mid back and left neck. The symptoms are aggravated by deep breathing and exertion. Associated symptoms include cough and shortness of breath. Pertinent negatives include no abdominal pain, no nausea, no PND and no sputum production. He has tried nothing for the symptoms. The treatment provided no relief.    Past Medical History:  Diagnosis Date  . Bipolar disorder (HCC)   . Depression   . GERD (gastroesophageal reflux disease)     Patient Active Problem List   Diagnosis Date Noted  . Chest pain 05/13/2012  . Tobacco abuse 05/10/2012  . Alcohol abuse 05/10/2012  . Transaminitis 05/10/2012  . GERD (gastroesophageal reflux disease) 05/10/2012    History reviewed. No pertinent surgical history.     Home Medications    Prior to Admission medications   Medication Sig Start Date End Date Taking? Authorizing Provider  ibuprofen (ADVIL,MOTRIN) 400 MG tablet Take 1 tablet (400 mg total) by mouth every 6 (six) hours as needed for moderate pain. 10/08/15  Yes Linwood DibblesJon Knapp, MD  Multiple Vitamin (MULTIVITAMIN) tablet Take 1 tablet by mouth daily.   Yes Historical Provider, MD  POTASSIUM PO Take 1 tablet by mouth daily.   Yes Historical Provider, MD  aspirin EC 81 MG tablet Take 1 tablet (81 mg total) by mouth daily. 05/17/16   Marily MemosJason Alayla Dethlefs, MD  azithromycin (ZITHROMAX) 250 MG tablet Take 1 tablet (250 mg total) by mouth daily. Take 1 every day until  finished. 05/18/16   Marily MemosJason Idalie Canto, MD  benzonatate (TESSALON) 100 MG capsule Take 1 capsule (100 mg total) by mouth every 8 (eight) hours. 05/17/16   Marily MemosJason Azaylea Maves, MD  predniSONE (DELTASONE) 20 MG tablet 2 tabs po daily x 4 days 05/18/16   Marily MemosJason Horatio Bertz, MD    Family History Family History  Problem Relation Age of Onset  . Diabetes Mother   . Hyperlipidemia Father   . Hypertension Father   . Coronary artery disease Father     Stents placed  . Heart disease Maternal Grandmother   . Heart disease Paternal Grandmother     Social History Social History  Substance Use Topics  . Smoking status: Current Every Day Smoker    Packs/day: 1.50    Types: Cigarettes  . Smokeless tobacco: Never Used  . Alcohol use Yes     Allergies   Other and Penicillins   Review of Systems Review of Systems  Respiratory: Positive for cough and shortness of breath. Negative for sputum production.   Cardiovascular: Positive for chest pain. Negative for PND.  Gastrointestinal: Negative for abdominal pain and nausea.  All other systems reviewed and are negative.    Physical Exam Updated Vital Signs BP 136/91   Pulse 88   Temp 98.7 F (37.1 C) (Oral)   Resp 21   Ht 5\' 10"  (1.778 m)   Wt 193 lb (87.5 kg)   SpO2 96%  BMI 27.69 kg/m   Physical Exam  Constitutional: He is oriented to person, place, and time. He appears well-developed and well-nourished.  HENT:  Head: Normocephalic and atraumatic.  Eyes: Conjunctivae and EOM are normal.  Neck: Normal range of motion.  Cardiovascular: Normal rate.   Pulmonary/Chest: Effort normal. No respiratory distress. He has wheezes.  Abdominal: Soft. He exhibits no distension.  Musculoskeletal: Normal range of motion.  Neurological: He is alert and oriented to person, place, and time. No cranial nerve deficit.  Skin: Skin is warm and dry.  Nursing note and vitals reviewed.    ED Treatments / Results  Labs (all labs ordered are listed, but only  abnormal results are displayed) Labs Reviewed  BASIC METABOLIC PANEL - Abnormal; Notable for the following:       Result Value   Sodium 134 (*)    Glucose, Bld 110 (*)    BUN <5 (*)    All other components within normal limits  CBC - Abnormal; Notable for the following:    WBC 18.2 (*)    All other components within normal limits  TROPONIN I - Abnormal; Notable for the following:    Troponin I 0.04 (*)    All other components within normal limits  I-STAT TROPOININ, ED    EKG  EKG Interpretation  Date/Time:  Tuesday May 17 2016 09:05:15 EST Ventricular Rate:  105 PR Interval:  112 QRS Duration: 96 QT Interval:  350 QTC Calculation: 462 R Axis:   61 Text Interpretation:  Sinus tachycardia Otherwise normal ECG No significant change since last tracing Confirmed by [place] [facility] MD, Barbara CowerJASON [phone]) on 05/17/2016 10:05:52 AM       Radiology Dg Chest 2 View  Result Date: 05/17/2016 CLINICAL DATA:  Chest pain and cough EXAM: CHEST  2 VIEW COMPARISON:  Oct 23, 2013 FINDINGS: There is no edema or consolidation. Heart size and pulmonary vascularity are normal. No adenopathy. There old healed rib fractures on the left. There is also evidence of old trauma involving the lateral left clavicle. There is chronic acromioclavicular separation on the left. No pneumothorax. IMPRESSION: Evidence of old bony trauma on the left.  No edema or consolidation. Electronically Signed   By: Bretta BangWilliam  Woodruff III M.D.   On: 05/17/2016 09:42    Procedures Procedures (including critical care time)  Medications Ordered in ED Medications  LORazepam (ATIVAN) tablet 1 mg (not administered)    Or  LORazepam (ATIVAN) injection 1 mg (not administered)  thiamine (VITAMIN B-1) tablet 100 mg (100 mg Oral Not Given 05/17/16 1534)    Or  thiamine (B-1) injection 100 mg ( Intravenous See Alternative 05/17/16 1534)  folic acid (FOLVITE) tablet 1 mg (1 mg Oral Not Given 05/17/16 1535)  multivitamin with minerals  tablet 1 tablet (1 tablet Oral Not Given 05/17/16 1535)  LORazepam (ATIVAN) tablet 1 mg (1 mg Oral Not Given 05/17/16 1535)  albuterol (PROVENTIL HFA;VENTOLIN HFA) 108 (90 Base) MCG/ACT inhaler 2 puff (not administered)  sodium chloride 0.9 % bolus 1,000 mL (0 mLs Intravenous Stopped 05/17/16 1206)  methylPREDNISolone sodium succinate (SOLU-MEDROL) 125 mg/2 mL injection 125 mg (125 mg Intravenous Given 05/17/16 1034)  ipratropium-albuterol (DUONEB) 0.5-2.5 (3) MG/3ML nebulizer solution 3 mL (3 mLs Nebulization Given 05/17/16 1034)  benzonatate (TESSALON) capsule 100 mg (100 mg Oral Given 05/17/16 1036)  azithromycin (ZITHROMAX) 500 mg in dextrose 5 % 250 mL IVPB (0 mg Intravenous Stopped 05/17/16 1206)  ibuprofen (ADVIL,MOTRIN) tablet 800 mg (800 mg Oral Given 05/17/16 1047)  azithromycin (ZITHROMAX) tablet 500 mg (500 mg Oral Given 05/17/16 1224)  aspirin tablet 325 mg (325 mg Oral Given 05/17/16 1349)     Initial Impression / Assessment and Plan / ED Course  I have reviewed the triage vital signs and the nursing notes.  Pertinent labs & imaging results that were available during my care of the patient were reviewed by me and considered in my medical decision making (see chart for details).  Clinical Course    Likely bronchitis. Lower concern for PE.    the patient's second troponin was mildly elevated, but no ecg changes concerning for NSTEMI so cardiology called for admission however patient adamantly refused to be admitted. I made him aware of the risks of worsening pain, heart failure or death if not treated. He understands and is competent to make the deicsion and still wants to leave AMA. Care management consulted and will attempt to get closer follow up with community health and wellness.  Will treat for bronchitis, start aspirin and urge return if he changes his mind.   Final Clinical Impressions(s) / ED Diagnoses   Final diagnoses:  Nonspecific chest pain  NSTEMI (non-ST  elevated myocardial infarction) (HCC)  Bronchitis    New Prescriptions New Prescriptions   ASPIRIN EC 81 MG TABLET    Take 1 tablet (81 mg total) by mouth daily.   AZITHROMYCIN (ZITHROMAX) 250 MG TABLET    Take 1 tablet (250 mg total) by mouth daily. Take 1 every day until finished.   BENZONATATE (TESSALON) 100 MG CAPSULE    Take 1 capsule (100 mg total) by mouth every 8 (eight) hours.   PREDNISONE (DELTASONE) 20 MG TABLET    2 tabs po daily x 4 days     Marily Memos, MD 05/17/16 1549

## 2016-05-17 NOTE — ED Notes (Signed)
Patient returned to the room, placed back on monitor patient has no complaints at this time.

## 2016-05-17 NOTE — Consult Note (Signed)
Patient ID: Dustin Olsen MRN: [id number], DOB/AGE: 11-22-1970   Admit date: 05/17/2016  Requesting physician: Dr. Clayborne Dana  Primary Physician: No PCP Per Patient Primary Cardiologist: New - Dr. Eldridge Dace Reason for admission: chest pain   Pt. Profile:  Dustin Olsen is a 45 y.o. male with a history of bipolar disorder, GERD, depression , tobacco abuse and strong family history of CAD who presented to [facility] to day with chest pain.   He smokes 1.5 PPD x 30 years and drinks about 12-14 beers a night. No illicit drug use. He does not see a regular medical doctor and doesn't take any medications at home except ibuprofen. His father has had multiple MIs with his first event in his early 19s. Also maternal GM with CAD.   He started noticing central chest pain starting last week that has been constant. No associated SOB but has chronic dyspnea on exertion. On Sunday night he felt nauseated and diaphoretic that he initially attributed to drinking too much. Then on Sunday night he had severe chest pain that lasted until Monday. Chest pain has been constant. He finally decided to present to [facility] ED for further evaluation and treatment.  No LE edema, orthopnea or PND. No dizziness or syncope.   In the ER: Na 134, K 4.5, creat 0.74, POC trop 0.00, Troponin I 0.04, WBC 18.2, BG 110. ECG with NSR.   Problem List  Past Medical History:  Diagnosis Date  . Bipolar disorder (HCC)   . Depression   . GERD (gastroesophageal reflux disease)     History reviewed. No pertinent surgical history.   Allergies  Allergies  Allergen Reactions  . Other Nausea Only    Pain medication-unsure which one  . Penicillins Rash    Childhood reaction     Home Medications  Prior to Admission medications   Medication Sig Start Date End Date Taking? Authorizing Provider  ibuprofen (ADVIL,MOTRIN) 400 MG tablet Take 1 tablet (400 mg total) by mouth every 6 (six) hours as needed for moderate pain. 10/08/15  Yes Linwood Dibbles, MD  Multiple Vitamin (MULTIVITAMIN) tablet Take 1 tablet by mouth daily.   Yes Historical Provider, MD  POTASSIUM PO Take 1 tablet by mouth daily.   Yes Historical Provider, MD  HYDROmorphone (DILAUDID) 2 MG tablet Take 0.5 tablets (1 mg total) by mouth every 6 (six) hours as needed for severe pain. Patient not taking: Reported on 05/17/2016 10/23/13   Gerhard Munch, MD    Family History  Family History  Problem Relation Age of Onset  . Diabetes Mother   . Hyperlipidemia Father   . Hypertension Father   . Coronary artery disease Father     Stents placed  . Heart disease Maternal Grandmother   . Heart disease Paternal Grandmother    Family Status  Relation Status  . Mother   . Father   . Maternal Grandmother   . Paternal Grandmother      Social History  Social History   Social History  . Marital status: Legally Separated    Spouse name: N/A  . Number of children: N/A  . Years of education: N/A   Occupational History  . Not on file.   Social History Main Topics  . Smoking status: Current Every Day Smoker    Packs/day: 1.50    Types: Cigarettes  . Smokeless tobacco: Never Used  . Alcohol use Yes  . Drug use: No  . Sexual activity: Yes   Other  Topics Concern  . Not on file   Social History Narrative  . No narrative on file     Review of Systems General:  No chills, fever, night sweats or weight changes.  Cardiovascular:  ++ chest pain, ++dyspnea on exertion, NO LE edema, orthopnea, palpitations, paroxysmal nocturnal dyspnea. Dermatological: No rash, lesions/masses Respiratory: ++ cough, dyspnea Urologic: No hematuria, dysuria Abdominal:+++ nausea, vomiting, No diarrhea, bright red blood per rectum, melena, or hematemesis Neurologic:  No visual changes, wkns, changes in mental status. All other systems reviewed and are otherwise negative except as noted above.  Physical Exam  Blood pressure 122/77, pulse 89, temperature 98.7 F (37.1 C),  temperature source Oral, resp. rate 20, height 5\' 10"  (1.778 m), weight 193 lb (87.5 kg), SpO2 94 %.  General: Pleasant, NAD, obese Psych: Normal affect. Neuro: Alert and oriented X 3. Moves all extremities spontaneously. HEENT: Normal  Neck: Supple without bruits or JVD. Lungs:  Resp regular and unlabored, CTA. Heart: RRR no s3, s4, or murmurs. Abdomen: Soft, non-tender, non-distended, BS + x 4.  Extremities: No clubbing, cyanosis or edema. DP/PT/Radials 2+ and equal bilaterally.  Labs   Recent Labs  05/17/16 1217  TROPONINI 0.04*   Lab Results  Component Value Date   WBC 18.2 (H) 05/17/2016   HGB 15.9 05/17/2016   HCT 46.3 05/17/2016   MCV 95.7 05/17/2016   PLT 243 05/17/2016    Recent Labs Lab 05/17/16 0913  NA 134*  K 4.5  CL 102  CO2 24  BUN <5*  CREATININE 0.74  CALCIUM 9.4  GLUCOSE 110*   No results found for: CHOL, HDL, LDLCALC, TRIG No results found for: DDIMER   Radiology/Studies  Dg Chest 2 View  Result Date: 05/17/2016 CLINICAL DATA:  Chest pain and cough EXAM: CHEST  2 VIEW COMPARISON:  Oct 23, 2013 FINDINGS: There is no edema or consolidation. Heart size and pulmonary vascularity are normal. No adenopathy. There old healed rib fractures on the left. There is also evidence of old trauma involving the lateral left clavicle. There is chronic acromioclavicular separation on the left. No pneumothorax. IMPRESSION: Evidence of old bony trauma on the left.  No edema or consolidation. Electronically Signed   By: Bretta BangWilliam  Woodruff III M.D.   On: 05/17/2016 09:42    ECG  NSR HR 89, early R wave progression   ASSESSMENT AND PLAN  Dustin Olsen is a 45 y.o. male with a history of bipolar disorder, GERD, depression , tobacco abuse and strong family history of CAD who [facility].   Chest pain: troponin I 0.04. ECG with no acute ST or TW changes. Would wait on next troponin before deciding on ischemic eval. Plan would be stress test  vs cath with a low threshold for cath. He has many RFs for CAD including family history of early CAD, heavy tobacco abuse, likely untreated HTN and HLD. He is not sure he wants to stay and say he needs to go home. I have discussed with him that this would be against medical advice.   Alcohol abuse: I think he will probably withdrawal. He drinks 12-14 beers a night. Would admit to medicine service with CIWA protocol if he stays  HTN: BP has been mildly elevated.      Signed, Cline CrockKathryn Thompson, PA-C 05/17/2016, 2:29 PM  Pager [phone]  I have examined the patient and reviewed assessment and plan and discussed with patient.  Agree with above as stated.  Patietn immediately states he is going home.  He continues to have chest pressure but he thinks this is bronchitis.  He has had it before.  I recommended that he stay for a cardiac eval given his risk factors for CAD and active discomfort.  I gave him my card and asked that he call with any questions.  I discussed angiography with him.  I think that would be the best option currently to evaluate his heart given his ongoing discomfort.  He was polite and agreed to call if he had any further problems.   Lance MussJayadeep Lawrnce Reyez

## 2016-06-15 ENCOUNTER — Ambulatory Visit: Payer: Self-pay | Admitting: Family Medicine

## 2017-02-23 ENCOUNTER — Emergency Department (HOSPITAL_COMMUNITY): Payer: Self-pay

## 2017-02-23 ENCOUNTER — Encounter (HOSPITAL_COMMUNITY): Payer: Self-pay | Admitting: *Deleted

## 2017-02-23 ENCOUNTER — Emergency Department (HOSPITAL_COMMUNITY)
Admission: EM | Admit: 2017-02-23 | Discharge: 2017-02-23 | Disposition: A | Discharge status: Home / Self Care | Payer: Self-pay | Attending: Emergency Medicine | Admitting: Emergency Medicine

## 2017-02-23 DIAGNOSIS — G5632 Lesion of radial nerve, left upper limb: Secondary | ICD-10-CM | POA: Insufficient documentation

## 2017-02-23 DIAGNOSIS — W19XXXA Unspecified fall, initial encounter: Secondary | ICD-10-CM | POA: Insufficient documentation

## 2017-02-23 DIAGNOSIS — Y939 Activity, unspecified: Secondary | ICD-10-CM | POA: Insufficient documentation

## 2017-02-23 DIAGNOSIS — Y999 Unspecified external cause status: Secondary | ICD-10-CM | POA: Insufficient documentation

## 2017-02-23 DIAGNOSIS — Y929 Unspecified place or not applicable: Secondary | ICD-10-CM | POA: Insufficient documentation

## 2017-02-23 DIAGNOSIS — F17210 Nicotine dependence, cigarettes, uncomplicated: Secondary | ICD-10-CM | POA: Insufficient documentation

## 2017-02-23 MED ORDER — METHYLPREDNISOLONE 4 MG PO TBPK
ORAL_TABLET | ORAL | 0 refills | Status: DC
Start: 2017-02-23 — End: 2017-05-07

## 2017-02-23 MED ORDER — OXYCODONE-ACETAMINOPHEN 5-325 MG PO TABS
ORAL_TABLET | ORAL | Status: AC
  Filled 2017-02-23: qty 1

## 2017-02-23 MED ORDER — OXYCODONE-ACETAMINOPHEN 5-325 MG PO TABS
1.0000 | ORAL_TABLET | ORAL | Status: DC | PRN
  Administered 2017-02-23: 1 via ORAL

## 2017-02-23 NOTE — Discharge Instructions (Signed)
Contact a health care provider if: You have a sudden increase in pain. You develop new numbness or new loss of sensation in your hand. You have a sudden change in your ability to move your arm, wrist, or hand. Your fingers become bluish in color or they feel cold to the touch.

## 2017-02-23 NOTE — ED Provider Notes (Signed)
[facility]Y DEPT Provider Note   CSN: [id number] Arrival date & time: 02/23/17  1207     History   Chief Complaint Chief Complaint  Patient presents with  . Fall    HPI Dustin Olsen is a [age] y.o. male who presents emergency Department with chief complaint of left radial nerve palsy. Patient has a history of previous before meals joint separation on the left with chronic joint separation, he also has a history of multiple episodes of falls, contusions and rib fractures. I have a very strong suspicion for alcohol abuse. The patient is minimally forthcoming with the amount of alcohol he drinks. He does seem somewhat tremulous here on my examination. The patient states that he fell onto his left shoulder yesterday. He has some pain there but predominantly he is concerned because he is unable to extend his left wrist. His left hand hangs in he cannot lift it up. Patient states he went to bed normally last night and this morning he cannot move the wrist. He thinks that he may have slept with his left arm up in extension above his head. He is unsure if that is true. He is concerned because of the fall yesterday. Patient states he only had "a few beers." Yesterday he denies being intoxicated.  HPI  History reviewed. No pertinent past medical history.  There are no active problems to display for this patient.   History reviewed. No pertinent surgical history.  OB History    No data available       Home Medications    Prior to Admission medications   Not on File    Family History No family history on file.  Social History Social History  Substance Use Topics  . Smoking status: Current Every Day Smoker    Packs/day: 1.00    Types: Cigarettes  . Smokeless tobacco: Never Used  . Alcohol use Not on file     Allergies   Patient has no known allergies.   Review of Systems Review of Systems Ten systems reviewed and are negative for acute change, except as noted in the  HPI.   Physical Exam Updated Vital Signs BP (!) 142/104 (BP Location: Right Arm)   Pulse 89   Temp 98.2 F (36.8 C) (Oral)   Resp 18   Ht 5\' 10"  (1.778 m)   Wt 72.6 kg (160 lb)   SpO2 97%   BMI 22.96 kg/m   Physical Exam  Constitutional: He appears well-developed and well-nourished. No distress.  HENT:  Head: Normocephalic and atraumatic.  Eyes: Conjunctivae are normal. No scleral icterus.  Neck: Normal range of motion. Neck supple.  Cardiovascular: Normal rate, regular rhythm and normal heart sounds.   Pulmonary/Chest: Effort normal and breath sounds normal. No respiratory distress.  Abdominal: Soft. There is no tenderness.  Musculoskeletal: He exhibits no edema.  Left shoulder deformity consistent with before meals joint separation, no bony tenderness, no tenderness along the humeral shaft. Patient has some chronic-appearing muscle atrophy of the deltoid and biceps. He does however have full range of motion at the glenohumeral joint.  Wrist drop on the left. Unable to extend the wrist. Strong grip strength. Normal flexion of the wrist. No issues with normal radial distal deviation. Normal pulse and sensation. Remainder of the left arm exam is normal in the elbow and shoulder  Neurological: He is alert.  Speech is clear and goal oriented, follows commands Major Cranial nerves without deficit, no facial droop ISOLATED WEAKNESS IN THE LEFT  WRIST, UNABLE TO EXTEND OTHERWISE Normal strength in upper and lower extremities bilaterally including dorsiflexion and plantar flexion, strong and equal grip strength Sensation normal to light and sharp touch Moves extremities without ataxia, coordination intact Normal finger to nose and rapid alternating movements Neg romberg, no pronator drift Normal gait Normal heel-shin and balance   Skin: Skin is warm and dry. He is not diaphoretic.  Psychiatric: His behavior is normal.  Nursing note and vitals reviewed.    ED Treatments / Results   Labs (all labs ordered are listed, but only abnormal results are displayed) Labs Reviewed - No data to display  EKG  EKG Interpretation None       Radiology Dg Wrist Complete Left  Result Date: 02/23/2017 CLINICAL DATA:  Pain following fall EXAM: LEFT WRIST - COMPLETE 3+ VIEW COMPARISON:  None. FINDINGS: Frontal, oblique, lateral, and ulnar deviation scaphoid images were obtained. There is no evident fracture or dislocation. Joint spaces appear normal. No erosive change. IMPRESSION: No fracture or dislocation.  No evident arthropathy. Electronically Signed   By: Bretta Bang III M.D.   On: 02/23/2017 12:50   Dg Shoulder Left  Result Date: 02/23/2017 CLINICAL DATA:  46 year old male status post trip and fall yesterday landing on the left side. EXAM: LEFT SHOULDER - 2+ VIEW COMPARISON:  Left shoulder series 09/22/2013. Chest radiographs 05/17/2016, and earlier. FINDINGS: Chronic deformity of the distal left clavicle involving the left coracoclavicular and acromioclavicular articulations. No superimposed acute left clavicle fracture identified. No glenohumeral joint dislocation. Proximal left humerus appears intact. The left scapula appears intact. Multilevel chronic left rib fractures, on these images seen to include the lateral fifth, seventh and eighth ribs. IMPRESSION: 1. No acute fracture or dislocation identified about the left shoulder. 2. Chronic deformity of the distal left clavicle affecting the left acromioclavicular and coracoclavicular articulations. 3. Chronic left rib fractures. Electronically Signed   By: Odessa Fleming M.D.   On: 02/23/2017 16:19    Procedures Procedures (including critical care time)  Medications Ordered in ED Medications  oxyCODONE-acetaminophen (PERCOCET/ROXICET) 5-325 MG per tablet 1 tablet (1 tablet Oral Given 02/23/17 1220)  oxyCODONE-acetaminophen (PERCOCET/ROXICET) 5-325 MG per tablet (not administered)     Initial Impression / Assessment and Plan /  ED Course  I have reviewed the triage vital signs and the nursing notes.  Pertinent labs & imaging results that were available during my care of the patient were reviewed by me and considered in my medical decision making (see chart for details).     Patient with isolated left radial nerve palsy. The remainder of his neuro exam is normal. Patient will be placed in left wrist splint. His shoulder and wrist x-ray showed no acute abnormalities. He is advised to follow with outpatient neurology and has been given a referral to the [facility]. I discussed the case with Dr. Verdie Mosher who is in agreement with the workup and plan of care.  Final Clinical Impressions(s) / ED Diagnoses   Final diagnoses:  Acute radial nerve palsy of left upper extremity    New Prescriptions New Prescriptions   No medications on file     Arthor Captain, PA-C 02/27/17 1503    Lavera Guise, MD 02/27/17 [phone]

## 2017-02-23 NOTE — ED Triage Notes (Signed)
Pt states that he tripped yesterday and fell landing on his left wrist. Pt reports limited movement and tingling in the hand. Pt able to move fingers

## 2017-02-24 ENCOUNTER — Encounter (HOSPITAL_COMMUNITY): Payer: Self-pay

## 2017-05-07 ENCOUNTER — Observation Stay (HOSPITAL_COMMUNITY): Payer: Self-pay

## 2017-05-07 ENCOUNTER — Emergency Department (HOSPITAL_COMMUNITY): Payer: Self-pay

## 2017-05-07 ENCOUNTER — Observation Stay (HOSPITAL_COMMUNITY)
Admission: EM | Admit: 2017-05-07 | Discharge: 2017-05-08 | Disposition: A | Discharge status: Home / Self Care | Payer: Self-pay | Attending: Family Medicine | Admitting: Family Medicine

## 2017-05-07 ENCOUNTER — Encounter (HOSPITAL_COMMUNITY): Payer: Self-pay | Admitting: *Deleted

## 2017-05-07 ENCOUNTER — Other Ambulatory Visit: Payer: Self-pay

## 2017-05-07 DIAGNOSIS — J90 Pleural effusion, not elsewhere classified: Secondary | ICD-10-CM | POA: Insufficient documentation

## 2017-05-07 DIAGNOSIS — Z9181 History of falling: Secondary | ICD-10-CM | POA: Insufficient documentation

## 2017-05-07 DIAGNOSIS — Z79899 Other long term (current) drug therapy: Secondary | ICD-10-CM | POA: Insufficient documentation

## 2017-05-07 DIAGNOSIS — F319 Bipolar disorder, unspecified: Secondary | ICD-10-CM | POA: Insufficient documentation

## 2017-05-07 DIAGNOSIS — F1010 Alcohol abuse, uncomplicated: Secondary | ICD-10-CM | POA: Insufficient documentation

## 2017-05-07 DIAGNOSIS — J189 Pneumonia, unspecified organism: Principal | ICD-10-CM | POA: Diagnosis present

## 2017-05-07 DIAGNOSIS — Z7982 Long term (current) use of aspirin: Secondary | ICD-10-CM | POA: Insufficient documentation

## 2017-05-07 DIAGNOSIS — F17210 Nicotine dependence, cigarettes, uncomplicated: Secondary | ICD-10-CM | POA: Insufficient documentation

## 2017-05-07 DIAGNOSIS — R042 Hemoptysis: Secondary | ICD-10-CM | POA: Insufficient documentation

## 2017-05-07 DIAGNOSIS — F329 Major depressive disorder, single episode, unspecified: Secondary | ICD-10-CM

## 2017-05-07 DIAGNOSIS — Z880 Allergy status to penicillin: Secondary | ICD-10-CM | POA: Insufficient documentation

## 2017-05-07 DIAGNOSIS — Z720 Tobacco use: Secondary | ICD-10-CM | POA: Diagnosis present

## 2017-05-07 DIAGNOSIS — E871 Hypo-osmolality and hyponatremia: Secondary | ICD-10-CM | POA: Insufficient documentation

## 2017-05-07 DIAGNOSIS — E876 Hypokalemia: Secondary | ICD-10-CM | POA: Insufficient documentation

## 2017-05-07 DIAGNOSIS — K219 Gastro-esophageal reflux disease without esophagitis: Secondary | ICD-10-CM | POA: Insufficient documentation

## 2017-05-07 DIAGNOSIS — F32A Depression, unspecified: Secondary | ICD-10-CM

## 2017-05-07 HISTORY — DX: Dyspnea, unspecified: R06.00

## 2017-05-07 HISTORY — DX: Anxiety disorder, unspecified: F41.9

## 2017-05-07 LAB — CBC WITH DIFFERENTIAL/PLATELET
Basophils Absolute: 0.1 10*3/uL (ref 0.0–0.1)
Basophils Relative: 0 %
Eosinophils Absolute: 0.2 10*3/uL (ref 0.0–0.7)
Eosinophils Relative: 1 %
HCT: 39.7 % (ref 39.0–52.0)
Hemoglobin: 13.6 g/dL (ref 13.0–17.0)
Lymphocytes Relative: 11 %
Lymphs Abs: 1.6 10*3/uL (ref 0.7–4.0)
MCH: 32.8 pg (ref 26.0–34.0)
MCHC: 34.3 g/dL (ref 30.0–36.0)
MCV: 95.7 fL (ref 78.0–100.0)
Monocytes Absolute: 2.3 10*3/uL — ABNORMAL HIGH (ref 0.1–1.0)
Monocytes Relative: 15 %
Neutro Abs: 11.3 10*3/uL — ABNORMAL HIGH (ref 1.7–7.7)
Neutrophils Relative %: 73 %
Platelets: 210 10*3/uL (ref 150–400)
RBC: 4.15 MIL/uL — ABNORMAL LOW (ref 4.22–5.81)
RDW: 13.3 % (ref 11.5–15.5)
WBC: 15.4 10*3/uL — ABNORMAL HIGH (ref 4.0–10.5)

## 2017-05-07 LAB — HEMOGLOBIN A1C
Hgb A1c MFr Bld: 5.2 % (ref 4.8–5.6)
Mean Plasma Glucose: 102.54 mg/dL

## 2017-05-07 LAB — LIPID PANEL
Cholesterol: 117 mg/dL (ref 0–200)
HDL: 50 mg/dL (ref >40)
LDL Cholesterol: 57 mg/dL (ref 0–99)
Total CHOL/HDL Ratio: 2.3 RATIO
Triglycerides: 51 mg/dL (ref <150)
VLDL: 10 mg/dL (ref 0–40)

## 2017-05-07 LAB — BASIC METABOLIC PANEL
Anion gap: 12 (ref 5–15)
BUN: 5 mg/dL — ABNORMAL LOW (ref 6–20)
CO2: 20 mmol/L — ABNORMAL LOW (ref 22–32)
Calcium: 8.5 mg/dL — ABNORMAL LOW (ref 8.9–10.3)
Chloride: 97 mmol/L — ABNORMAL LOW (ref 101–111)
Creatinine, Ser: 0.6 mg/dL — ABNORMAL LOW (ref 0.61–1.24)
GFR calc Af Amer: >60 mL/min (ref >60)
GFR calc non Af Amer: >60 mL/min (ref >60)
Glucose, Bld: 97 mg/dL (ref 65–99)
Potassium: 3.5 mmol/L (ref 3.5–5.1)
Sodium: 129 mmol/L — ABNORMAL LOW (ref 135–145)

## 2017-05-07 LAB — PROCALCITONIN: Procalcitonin: 0.84 ng/mL

## 2017-05-07 LAB — SODIUM: Sodium: 132 mmol/L — ABNORMAL LOW (ref 135–145)

## 2017-05-07 LAB — I-STAT CG4 LACTIC ACID, ED: Lactic Acid, Venous: 0.89 mmol/L (ref 0.5–1.9)

## 2017-05-07 LAB — CHLORIDE: Chloride: 102 mmol/L (ref 101–111)

## 2017-05-07 LAB — TSH: TSH: 0.837 u[IU]/mL (ref 0.350–4.500)

## 2017-05-07 LAB — STREP PNEUMONIAE URINARY ANTIGEN: Strep Pneumo Urinary Antigen: NEGATIVE

## 2017-05-07 MED ORDER — IPRATROPIUM-ALBUTEROL 0.5-2.5 (3) MG/3ML IN SOLN
3.0000 mL | RESPIRATORY_TRACT | Status: DC | PRN
  Administered 2017-05-07: 3 mL via RESPIRATORY_TRACT
  Filled 2017-05-07: qty 3

## 2017-05-07 MED ORDER — AZITHROMYCIN 250 MG PO TABS
500.0000 mg | ORAL_TABLET | Freq: Once | ORAL | Status: AC
  Administered 2017-05-07: 500 mg via ORAL
  Filled 2017-05-07: qty 2

## 2017-05-07 MED ORDER — ALBUTEROL SULFATE (2.5 MG/3ML) 0.083% IN NEBU: 2.5000 mg | INHALATION_SOLUTION | RESPIRATORY_TRACT | Status: DC | PRN

## 2017-05-07 MED ORDER — FOLIC ACID 1 MG PO TABS
1.0000 mg | ORAL_TABLET | Freq: Every day | ORAL | Status: DC
  Administered 2017-05-07 – 2017-05-08 (×2): 1 mg via ORAL
  Filled 2017-05-07 (×2): qty 1

## 2017-05-07 MED ORDER — IOPAMIDOL (ISOVUE-370) INJECTION 76%
INTRAVENOUS | Status: AC
  Administered 2017-05-07: 100 mL via INTRAVENOUS
  Filled 2017-05-07: qty 100

## 2017-05-07 MED ORDER — LORAZEPAM 1 MG PO TABS: 1.0000 mg | ORAL_TABLET | Freq: Four times a day (QID) | ORAL | Status: DC | PRN

## 2017-05-07 MED ORDER — POLYETHYL GLYCOL-PROPYL GLYCOL 0.4-0.3 % OP GEL: Freq: Every day | OPHTHALMIC | Status: DC | PRN

## 2017-05-07 MED ORDER — HYDROCOD POLST-CPM POLST ER 10-8 MG/5ML PO SUER
5.0000 mL | Freq: Two times a day (BID) | ORAL | Status: DC | PRN
  Administered 2017-05-07: 5 mL via ORAL
  Filled 2017-05-07: qty 5

## 2017-05-07 MED ORDER — IPRATROPIUM-ALBUTEROL 0.5-2.5 (3) MG/3ML IN SOLN
3.0000 mL | Freq: Once | RESPIRATORY_TRACT | Status: AC
  Administered 2017-05-07: 3 mL via RESPIRATORY_TRACT
  Filled 2017-05-07: qty 3

## 2017-05-07 MED ORDER — ACETAMINOPHEN 325 MG PO TABS
650.0000 mg | ORAL_TABLET | Freq: Once | ORAL | Status: AC
  Administered 2017-05-07: 650 mg via ORAL
  Filled 2017-05-07: qty 2

## 2017-05-07 MED ORDER — GUAIFENESIN ER 600 MG PO TB12
600.0000 mg | ORAL_TABLET | Freq: Two times a day (BID) | ORAL | Status: DC | PRN
  Administered 2017-05-07: 600 mg via ORAL
  Filled 2017-05-07: qty 1

## 2017-05-07 MED ORDER — THIAMINE HCL 100 MG/ML IJ SOLN: 100.0000 mg | Freq: Every day | INTRAMUSCULAR | Status: DC

## 2017-05-07 MED ORDER — SODIUM CHLORIDE 0.9 % IV SOLN
INTRAVENOUS | Status: DC
  Administered 2017-05-07 – 2017-05-08 (×3): via INTRAVENOUS

## 2017-05-07 MED ORDER — LORAZEPAM 2 MG/ML IJ SOLN
1.0000 mg | Freq: Four times a day (QID) | INTRAMUSCULAR | Status: DC | PRN
  Administered 2017-05-08: 1 mg via INTRAVENOUS
  Filled 2017-05-07: qty 1

## 2017-05-07 MED ORDER — ONDANSETRON HCL 4 MG/2ML IJ SOLN: 4.0000 mg | Freq: Three times a day (TID) | INTRAMUSCULAR | Status: DC | PRN

## 2017-05-07 MED ORDER — KETOROLAC TROMETHAMINE 15 MG/ML IJ SOLN
15.0000 mg | Freq: Once | INTRAMUSCULAR | Status: AC
  Administered 2017-05-07: 15 mg via INTRAVENOUS
  Filled 2017-05-07: qty 1

## 2017-05-07 MED ORDER — SODIUM CHLORIDE 0.9 % IV BOLUS (SEPSIS)
1000.0000 mL | Freq: Once | INTRAVENOUS | Status: AC
  Administered 2017-05-07: 1000 mL via INTRAVENOUS

## 2017-05-07 MED ORDER — ADULT MULTIVITAMIN W/MINERALS CH
1.0000 | ORAL_TABLET | Freq: Every day | ORAL | Status: DC
  Administered 2017-05-07 – 2017-05-08 (×2): 1 via ORAL
  Filled 2017-05-07 (×2): qty 1

## 2017-05-07 MED ORDER — LEVOFLOXACIN IN D5W 750 MG/150ML IV SOLN
750.0000 mg | INTRAVENOUS | Status: DC
  Filled 2017-05-07: qty 150

## 2017-05-07 MED ORDER — KETOROLAC TROMETHAMINE 15 MG/ML IJ SOLN
15.0000 mg | Freq: Three times a day (TID) | INTRAMUSCULAR | Status: DC | PRN
  Administered 2017-05-07 – 2017-05-08 (×2): 15 mg via INTRAVENOUS
  Filled 2017-05-07 (×2): qty 1

## 2017-05-07 MED ORDER — NICOTINE 14 MG/24HR TD PT24
14.0000 mg | MEDICATED_PATCH | Freq: Every day | TRANSDERMAL | Status: DC
  Administered 2017-05-07 – 2017-05-08 (×2): 14 mg via TRANSDERMAL
  Filled 2017-05-07 (×2): qty 1

## 2017-05-07 MED ORDER — VITAMIN B-1 100 MG PO TABS
100.0000 mg | ORAL_TABLET | Freq: Every day | ORAL | Status: DC
  Administered 2017-05-07 – 2017-05-08 (×2): 100 mg via ORAL
  Filled 2017-05-07 (×2): qty 1

## 2017-05-07 MED ORDER — ALBUTEROL SULFATE (2.5 MG/3ML) 0.083% IN NEBU
5.0000 mg | INHALATION_SOLUTION | Freq: Once | RESPIRATORY_TRACT | Status: AC
  Administered 2017-05-07: 5 mg via RESPIRATORY_TRACT
  Filled 2017-05-07: qty 6

## 2017-05-07 MED ORDER — DEXTROSE 5 % IV SOLN
1.0000 g | Freq: Once | INTRAVENOUS | Status: AC
  Administered 2017-05-07: 1 g via INTRAVENOUS
  Filled 2017-05-07: qty 10

## 2017-05-07 NOTE — ED Triage Notes (Signed)
Pt states cough x 1 week.  Last night he had a coughing spell and experienced R upper back pain and coughed up blood.

## 2017-05-07 NOTE — ED Provider Notes (Signed)
[facility] Provider Note   CSN: [id number] Arrival date & time: 05/07/17  0751     History   Chief Complaint Chief Complaint  Patient presents with  . Cough  . Chest Pain    HPI Dustin DraftFloyd T Rainone Jr. is a 46 y.o. male who presents with chest pain and a cough. PMH significant for bipolar d/o, GERD, depression, smoking, alcohol abuse. He states that over the past week he has had a productive cough. He has continued to smoke. Last night he developed chest pain and back pain which is constant but worse with coughing. He coughed this morning and coughed up a quarter sized blood clot. He was unable to sleep so this morning came to the ED. He has had hot/cold "spells", SOB, and wheezing. He denies cardiac or lung disease. He does not use inhalers. He denies use of any OTC meds this week.  HPI  Past Medical History:  Diagnosis Date  . Bipolar disorder (HCC)   . Depression   . GERD (gastroesophageal reflux disease)     Patient Active Problem List   Diagnosis Date Noted  . Chest pain 05/13/2012  . Tobacco abuse 05/10/2012  . Alcohol abuse 05/10/2012  . Transaminitis 05/10/2012  . GERD (gastroesophageal reflux disease) 05/10/2012    History reviewed. No pertinent surgical history.     Home Medications    Prior to Admission medications   Medication Sig Start Date End Date Taking? Authorizing Provider  aspirin EC 81 MG tablet Take 1 tablet (81 mg total) by mouth daily. 05/17/16   Mesner, Barbara CowerJason, MD  azithromycin (ZITHROMAX) 250 MG tablet Take 1 tablet (250 mg total) by mouth daily. Take 1 every day until finished. 05/18/16   Mesner, Barbara CowerJason, MD  benzonatate (TESSALON) 100 MG capsule Take 1 capsule (100 mg total) by mouth every 8 (eight) hours. 05/17/16   Mesner, Barbara CowerJason, MD  ibuprofen (ADVIL,MOTRIN) 400 MG tablet Take 1 tablet (400 mg total) by mouth every 6 (six) hours as needed for moderate pain. 10/08/15   Linwood DibblesKnapp, Jon, MD  methylPREDNISolone  (MEDROL DOSEPAK) 4 MG TBPK tablet Use as directed 02/23/17   Arthor CaptainHarris, Abigail, PA-C  Multiple Vitamin (MULTIVITAMIN) tablet Take 1 tablet by mouth daily.    [provider]  POTASSIUM PO Take 1 tablet by mouth daily.    [provider]  predniSONE (DELTASONE) 20 MG tablet 2 tabs po daily x 4 days 05/18/16   Mesner, Barbara CowerJason, MD    Family History Family History  Problem Relation Age of Onset  . Diabetes Mother   . Hyperlipidemia Father   . Hypertension Father   . Coronary artery disease Father        Stents placed  . Heart disease Maternal Grandmother   . Heart disease Paternal Grandmother     Social History Social History   Tobacco Use  . Smoking status: Current Every Day Smoker    Packs/day: 1.00    Types: Cigarettes  . Smokeless tobacco: Never Used  Substance Use Topics  . Alcohol use: Yes    Comment: 6 pack per day  . Drug use: No     Allergies   Other and Penicillins   Review of Systems Review of Systems  Constitutional: Positive for chills and fever. Negative for appetite change.  HENT: Negative for congestion and rhinorrhea.   Respiratory: Positive for cough, shortness of breath and wheezing.        +hemoptysis  Cardiovascular: Positive for chest pain.  Gastrointestinal: Negative for abdominal pain, nausea and vomiting.  Musculoskeletal: Positive for back pain and myalgias.  All other systems reviewed and are negative.    Physical Exam Updated Vital Signs BP (!) 149/84 (BP Location: Right Arm)   Pulse (!) 114   Temp 98.4 F (36.9 C) (Oral)   Resp 18   Ht 5\' 11"  (1.803 m)   Wt 95.3 kg (210 lb)   SpO2 100%   BMI 29.29 kg/m   Physical Exam  Constitutional: He is oriented to person, place, and time. He appears well-developed and well-nourished. No distress.  HENT:  Head: Normocephalic and atraumatic.  Eyes: Conjunctivae are normal. Pupils are equal, round, and reactive to light. Right eye exhibits no discharge. Left eye exhibits no  discharge. No scleral icterus.  Neck: Normal range of motion.  Cardiovascular: Regular rhythm. Tachycardia present. Exam reveals no gallop and no friction rub.  No murmur heard. Pulmonary/Chest: Effort normal and breath sounds normal. No stridor. No respiratory distress. He has no wheezes. He has no rales. He exhibits no tenderness.  Abdominal: He exhibits no distension.  Neurological: He is alert and oriented to person, place, and time.  Skin: Skin is warm and dry.  Psychiatric: He has a normal mood and affect. His behavior is normal.  Nursing note and vitals reviewed.    ED Treatments / Results  Labs (all labs ordered are listed, but only abnormal results are displayed) Labs Reviewed  BASIC METABOLIC PANEL - Abnormal; Notable for the following components:      Result Value   Sodium 129 (*)    Chloride 97 (*)    CO2 20 (*)    BUN 5 (*)    Creatinine, Ser 0.60 (*)    Calcium 8.5 (*)    All other components within normal limits  CBC WITH DIFFERENTIAL/PLATELET - Abnormal; Notable for the following components:   WBC 15.4 (*)    RBC 4.15 (*)    Neutro Abs 11.3 (*)    Monocytes Absolute 2.3 (*)    All other components within normal limits  I-STAT CG4 LACTIC ACID, ED    EKG  EKG Interpretation  Date/Time:  Sunday May 07 2017 08:01:50 EST Ventricular Rate:  115 PR Interval:  126 QRS Duration: 92 QT Interval:  334 QTC Calculation: 462 R Axis:   63 Text Interpretation:  Sinus tachycardia Otherwise normal ECG Confirmed by Margarita Grizzleay, Danielle [phone]([id number]) on 05/07/2017 9:37:13 AM       Radiology Dg Chest 2 View  Result Date: 05/07/2017 CLINICAL DATA:  Hemoptysis.  Smoking history. EXAM: CHEST  2 VIEW COMPARISON:  None. FINDINGS: The heart size is upper limits of normal. Left upper lobe pneumonia is present in the lingula. Mild right middle lobe airspace disease is present as well. There are no effusions. The lower lobes are clear. Superior endplate fracture at T5 is likely  remote. Remote left-sided rib fractures are noted. The visualized soft tissues and bony thorax are otherwise unremarkable. IMPRESSION: 1. Lobar pneumonia involving the lingula of the left upper lobe. 2. Subtle airspace disease in the right middle lobe is also concerning for early lobar pneumonia. 3. T5 superior endplate compression fracture is likely remote. Electronically Signed   By: Marin Robertshristopher  Mattern M.D.   On: 05/07/2017 08:44    Procedures Procedures (including critical care time)  Medications Ordered in ED Medications  acetaminophen (TYLENOL) tablet 650 mg (650 mg Oral Given 05/07/17 0945)  ipratropium-albuterol (DUONEB) 0.5-2.5 (3) MG/3ML nebulizer solution 3 mL (3 mLs  Nebulization Given 05/07/17 0945)  cefTRIAXone (ROCEPHIN) 1 g in dextrose 5 % 50 mL IVPB (0 g Intravenous Stopped 05/07/17 1053)  azithromycin (ZITHROMAX) tablet 500 mg (500 mg Oral Given 05/07/17 0945)  sodium chloride 0.9 % bolus 1,000 mL (0 mLs Intravenous Stopped 05/07/17 1105)  albuterol (PROVENTIL) (2.5 MG/3ML) 0.083% nebulizer solution 5 mg (5 mg Nebulization Given 05/07/17 1105)     Initial Impression / Assessment and Plan / ED Course  I have reviewed the triage vital signs and the nursing notes.  Pertinent labs & imaging results that were available during my care of the patient were reviewed by me and considered in my medical decision making (see chart for details).  46 year old who presents with multifocal PNA. He is persistently tachycardic in the ED. He is not febrile or hypoxic and not in distress. Lungs are CTA. He was given fluids, Tylenol, and several nebs. CBC is remarkable for leukocytosis of 15.4. BMP is remarkable for hyponatremia (129) and hypochloremia (97). Lactic acid is normal. EKG is ST. CXR shows LUL and RML PNA. He was given antibiotics for CAP. Shared visit with Dr. Rosalia Hammers. Will admit for further management. Spoke with Camila Li who will admit.  Final Clinical Impressions(s) / ED Diagnoses    Final diagnoses:  Multifocal pneumonia  Hyponatremia    ED Discharge Orders    None       Bethel Born, PA-C 05/07/17 1516    Margarita Grizzle, MD 05/07/17 1600

## 2017-05-07 NOTE — ED Notes (Signed)
Ordered pt a meal tray.

## 2017-05-07 NOTE — ED Provider Notes (Signed)
46 year old man, smoker, presents today with 1 week of increasing cough and dyspnea.  Chest x-Dustin Olsen reveals multilobar pneumonia.  He continues to smoke a significant number of cigarettes.  His lungs have some rhonchi on exam.  He is being treated here with IV fluids, antibiotics, and albuterol.  He will be reevaluated after treatment to  determine disposition   I performed a history and physical examination of Dustin DraftFloyd T Rodger Jr. and discussed his management with Dustin Olsen.  I agree with the history, physical, assessment, and plan of care, with the following exceptions: None  I was present for the following procedures: None Time Spent in Critical Care of the patient: None Time spent in discussions with the patient and family: 10  Dustin Olsen     Dustin Shell, MD 05/07/17 1018

## 2017-05-07 NOTE — H&P (Signed)
History and Physical    Dustin DraftFloyd T Constantino Jr. ZOX:[id number] DOB: 09/06/1970 DOA: 05/07/2017   PCP: Patient, No Pcp Per   Patient coming from:  Home    Chief Complaint: Shortness of breath  HPI: Dustin DraftFloyd T Dohner Jr. is a 46 y.o. male with medical history significant for bipolar disorder, depression, GERD, heavy tobacco abuse 1 pack a day, alcohol abuse about a 6 pack daily, depression, who has not been seen by a PCP in a prolonged period of time, presenting with one-week history of progressive shortness of breath and cough, forceful, productive, green colored, worse over the last day, complicated with an episode of heavy hemoptysis.  The time, the patient thought medical attention.  Other symptoms include chills, subjective fever, night sweats,  wheezing.  She any history of lung disease or cancer.  He denies any recent long distance trips.  He denies any sick contacts.  He denies any headaches or vision changes.  His appetite is decreased due to the current symptoms.  He has some nausea without vomiting.  No confusion was reported.   Of note, had a important issue during his presentation includes  decreased mobility, as well as having some falls without syncope or seizures.  In 1 of those event, the patient has had a shoulder injury, which has subsequent sequela on the left hand, with decreased range of motion.  He denies any decrease in sensation in the area.  He denies any other substance abuse, such as cocaine, or crack, or marijuana  ED Course:  BP 118/73   Pulse (!) 107   Temp 98.4 F (36.9 C) (Oral)   Resp (!) 25   Ht 5\' 11"  (1.803 m)   Wt 95.3 kg (210 lb)   SpO2 96%   BMI 29.29 kg/m   At the ED, the patient was noted to have in his CBC a white count of 15.4.  Lactic acid 0.84. Chest x-ray was remarkable for a left upper lobe lingular infiltrate consistent with pneumonia, as well as a right middle lobe early pneumonia. Patient was placed on Rocephin and Zithromax.  Per patient report, he  developed "reaction" to Rocephin. In addition, the patient received 1 dose of DuoNeb, and another dose of albuterol, with improvement of his symptoms. 1 L of IV normal saline was given as well. Blood and sputum cultures are pending.  Review of Systems: As per HPI otherwise 10 point review of systems negative.   Past Medical History:  Diagnosis Date  . Bipolar disorder (HCC)   . Depression   . GERD (gastroesophageal reflux disease)     History reviewed. No pertinent surgical history.  Social History Social History   Socioeconomic History  . Marital status: Single    Spouse name: Not on file  . Number of children: Not on file  . Years of education: Not on file  . Highest education level: Not on file  Social Needs  . Financial resource strain: Not on file  . Food insecurity - worry: Not on file  . Food insecurity - inability: Not on file  . Transportation needs - medical: Not on file  . Transportation needs - non-medical: Not on file  Occupational History  . Not on file  Tobacco Use  . Smoking status: Current Every Day Smoker    Packs/day: 1.00    Types: Cigarettes  . Smokeless tobacco: Never Used  Substance and Sexual Activity  . Alcohol use: Yes    Comment: 6 pack per day  .  Drug use: No  . Sexual activity: Yes  Other Topics Concern  . Not on file  Social History Narrative   ** Merged History Encounter **         Allergies  Allergen Reactions  . Other Nausea Only    Pain medication-unsure which one  . Penicillins Rash    Childhood reaction  . Tramadol Hcl Nausea Only    Family History  Problem Relation Age of Onset  . Diabetes Mother   . Hyperlipidemia Father   . Hypertension Father   . Coronary artery disease Father        Stents placed  . Heart disease Maternal Grandmother   . Heart disease Paternal Grandmother       Prior to Admission medications   Medication Sig Start Date End Date Taking? Authorizing Provider  aspirin 325 MG EC tablet Take  650 mg every 4 (four) hours as needed by mouth for pain (headache).   Yes [provider]  Polyethyl Glycol-Propyl Glycol (SYSTANE OP) Place 1 drop daily as needed into both eyes (dry eyes).   Yes [provider]  aspirin EC 81 MG tablet Take 1 tablet (81 mg total) by mouth daily. Patient not taking: Reported on 05/07/2017 05/17/16   Mesner, Barbara Cower, MD  benzonatate (TESSALON) 100 MG capsule Take 1 capsule (100 mg total) by mouth every 8 (eight) hours. Patient not taking: Reported on 05/07/2017 05/17/16   Mesner, Barbara Cower, MD  ibuprofen (ADVIL,MOTRIN) 400 MG tablet Take 1 tablet (400 mg total) by mouth every 6 (six) hours as needed for moderate pain. Patient not taking: Reported on 05/07/2017 10/08/15   Linwood Dibbles, MD    Physical Exam:  Vitals:   05/07/17 1300 05/07/17 1330 05/07/17 1342 05/07/17 1404  BP: (!) 141/86 122/82 122/82 118/73  Pulse: (!) 121 (!) 105 (!) 110 (!) 107  Resp: (!) 29 (!) 25 (!) 24 (!) 25  Temp:      TempSrc:      SpO2: 95% 96% 95% 96%  Weight:      Height:       Constitutional: NAD, uncomfortable due to chills, appears ill Eyes: PERRL, lids and conjunctivae normal ENMT: Mucous membranes are moist, without exudate or lesions  Neck: normal, supple, no masses, no thyromegaly Respiratory: Decreased breath sounds in the right middle lobe and in the left lobe, with some atelectatic sounds, no wheezing   audible at this time,   no crackles. Normal respiratory effort  Cardiovascular: Regular rate and rhythm, no murmurs, rubs or gallops. No extremity edema. 2+ pedal pulses. No carotid bruits.  Abdomen: Soft, non tender, No hepatosplenomegaly. Bowel sounds positive.  Musculoskeletal: no clubbing / cyanosis. Moves all extremities.  Left hand in a removable splint, with decreased range of motion.  No decrease in sensation. Skin: no jaundice, No lesions.  Skin is very warm Neurologic: Sensation intact  Strength equal in all extremities.  Some tremor is noted on  intention Psychiatric:   Alert and oriented x 3.  Anxious,.     Labs on Admission: I have personally reviewed following labs and imaging studies  CBC: Recent Labs  Lab 05/07/17 0956  WBC 15.4*  NEUTROABS 11.3*  HGB 13.6  HCT 39.7  MCV 95.7  PLT 210    Basic Metabolic Panel: Recent Labs  Lab 05/07/17 0956  NA 129*  K 3.5  CL 97*  CO2 20*  GLUCOSE 97  BUN 5*  CREATININE 0.60*  CALCIUM 8.5*    GFR: Estimated  Creatinine Clearance: 137.4 mL/min (A) (by C-G formula based on SCr of 0.6 mg/dL (L)).  Liver Function Tests: No results for input(s): AST, ALT, ALKPHOS, BILITOT, PROT, ALBUMIN in the last 168 hours. No results for input(s): LIPASE, AMYLASE in the last 168 hours. No results for input(s): AMMONIA in the last 168 hours.  Coagulation Profile: No results for input(s): INR, PROTIME in the last 168 hours.  Cardiac Enzymes: No results for input(s): CKTOTAL, CKMB, CKMBINDEX, TROPONINI in the last 168 hours.  BNP (last 3 results) No results for input(s): PROBNP in the last 8760 hours.  HbA1C: No results for input(s): HGBA1C in the last 72 hours.  CBG: No results for input(s): GLUCAP in the last 168 hours.  Lipid Profile: No results for input(s): CHOL, HDL, LDLCALC, TRIG, CHOLHDL, LDLDIRECT in the last 72 hours.  Thyroid Function Tests: No results for input(s): TSH, T4TOTAL, FREET4, T3FREE, THYROIDAB in the last 72 hours.  Anemia Panel: No results for input(s): VITAMINB12, FOLATE, FERRITIN, TIBC, IRON, RETICCTPCT in the last 72 hours.  Urine analysis:    Component Value Date/Time   LABSPEC >=1.030 01/15/2007 1708   PHURINE 5.5 01/15/2007 1708   GLUCOSEU NEGATIVE 01/15/2007 1708   HGBUR NEGATIVE 01/15/2007 1708   BILIRUBINUR SMALL (A) 01/15/2007 1708   KETONESUR NEGATIVE 01/15/2007 1708   PROTEINUR NEGATIVE 01/15/2007 1708   UROBILINOGEN 0.2 01/15/2007 1708   NITRITE NEGATIVE 01/15/2007 1708   LEUKOCYTESUR  01/15/2007 1708    NEGATIVE  Biochemical  Testing Only. Please order routine urinalysis from main lab if confirmatory testing is needed.    Sepsis Labs: @LABRCNTIP (procalcitonin:4,lacticidven:4) )No results found for this or any previous visit (from the past 240 hour(s)).   Radiological Exams on Admission: Dg Chest 2 View  Result Date: 05/07/2017 CLINICAL DATA:  Hemoptysis.  Smoking history. EXAM: CHEST  2 VIEW COMPARISON:  None. FINDINGS: The heart size is upper limits of normal. Left upper lobe pneumonia is present in the lingula. Mild right middle lobe airspace disease is present as well. There are no effusions. The lower lobes are clear. Superior endplate fracture at T5 is likely remote. Remote left-sided rib fractures are noted. The visualized soft tissues and bony thorax are otherwise unremarkable. IMPRESSION: 1. Lobar pneumonia involving the lingula of the left upper lobe. 2. Subtle airspace disease in the right middle lobe is also concerning for early lobar pneumonia. 3. T5 superior endplate compression fracture is likely remote. Electronically Signed   By: Marin Robertshristopher  Mattern M.D.   On: 05/07/2017 08:44    EKG: Independently reviewed.  Assessment/Plan Active Problems:   Tobacco abuse   Alcohol abuse   GERD (gastroesophageal reflux disease)   Depression   Bipolar disorder (HCC)   Community acquired pneumonia   Pneumonia    Acute Respiratory distress without hypoxia, likely due to left lower lobe pneumonia, with some involvement of the right middle lobe chest x-ray. Presenting now with ongoing / progressive symptoms including productive cough, hemoptysis, subjective fevers, WBC 15.4, lactic acid 0.84.  Patient was placed on Rocephin and Zithromax.  Per patient report, he developed "reaction" to Rocephin.In addition, the patient received 1 dose of DuoNeb, and another dose of albuterol, with improvement of his symptoms.1 L of IV normal saline was given as well. Admit to MedSurg Oxygen   sputum cultures  IV antibiotics  with IV Levaquin per protocol Procalcitonin,Strep pneumo urine antigen, Legionella urine antigen Respiratory virus panel Nebulizers as needed, with Duoneb q 4 and Albuterol q 2 h prn Mucinex prn  IV fluids at 100 cc an hour for today. antipyretics as needed Repeat CBC in am  In addition, due to his history of tobacco abuse, hemoptysis, and tachycardia, will obtain a CT Angio of the chest, rule out the possibility of PE per well's score, at 1.5, which places him at moderate risk, versus the possibility of malignancy, in view of his history of heavy tobacco abuse, hyponatremia(see below)  Hyponatremia, unknown etiology, could be due to decreased oral intake.  No baseline sodium is available for review.  Current sodium is 129 , with no elevated blood sugar.IV Normal saline bolus was given, and now is at 100 cc an hour, hopefully expected to improve Check sodium in 6 hours, to follow-up on progress, not to allow it to increase more than 10 mEq a day. 1500 cc fluid restriction  His values do not improve, we will proceed with SIADH workup Check Chloride   Tobacco abuse with nicotine withdrawal Nicotine patch   Counseled cessation  Alcohol abuse and dependence and at risk for withdrawal  CIWA with Ativan per protocol  Thiamine, folate, and MVI   S/p L shoulder injury with left hand decreased ROM.  Denies any pain WIll obtain PT/OT   Other medical issues: The patient has a history of depression, bipolar disorder, not on meds, and in addition, the patient has not been able to have proper outpatient follow-up with a PCP.  Upon discharge, the patient will need to be established the primary physician.  In the interim, we will check his lipid panel, hemoglobin A1c and TSH for completion of his workup, and to help smooth the transition to PCP care upon discharge.  DVT prophylaxis:  SCD's   Code Status:   Full     Family Communication:  Discussed with patient and mother  Disposition Plan: Expect  patient to be discharged to home after condition improves Consults called:    None  Admission status: Medsurg Obs   Marlowe Kays, PA-C [facility]   05/07/2017, 2:25 PM

## 2017-05-08 ENCOUNTER — Observation Stay (HOSPITAL_COMMUNITY): Payer: Self-pay

## 2017-05-08 DIAGNOSIS — E871 Hypo-osmolality and hyponatremia: Secondary | ICD-10-CM

## 2017-05-08 DIAGNOSIS — Z720 Tobacco use: Secondary | ICD-10-CM

## 2017-05-08 DIAGNOSIS — K219 Gastro-esophageal reflux disease without esophagitis: Secondary | ICD-10-CM

## 2017-05-08 DIAGNOSIS — J189 Pneumonia, unspecified organism: Principal | ICD-10-CM

## 2017-05-08 LAB — RESPIRATORY PANEL BY PCR

## 2017-05-08 LAB — COMPREHENSIVE METABOLIC PANEL
ALT: 50 U/L (ref 17–63)
AST: 24 U/L (ref 15–41)
Albumin: 2.6 g/dL — ABNORMAL LOW (ref 3.5–5.0)
Alkaline Phosphatase: 60 U/L (ref 38–126)
Anion gap: 7 (ref 5–15)
BUN: 6 mg/dL (ref 6–20)
CO2: 24 mmol/L (ref 22–32)
Calcium: 8 mg/dL — ABNORMAL LOW (ref 8.9–10.3)
Chloride: 102 mmol/L (ref 101–111)
Creatinine, Ser: 0.57 mg/dL — ABNORMAL LOW (ref 0.61–1.24)
GFR calc Af Amer: >60 mL/min (ref >60)
GFR calc non Af Amer: >60 mL/min (ref >60)
Glucose, Bld: 110 mg/dL — ABNORMAL HIGH (ref 65–99)
Potassium: 3.2 mmol/L — ABNORMAL LOW (ref 3.5–5.1)
Sodium: 133 mmol/L — ABNORMAL LOW (ref 135–145)
Total Bilirubin: 0.9 mg/dL (ref 0.3–1.2)
Total Protein: 5.9 g/dL — ABNORMAL LOW (ref 6.5–8.1)

## 2017-05-08 LAB — EXPECTORATED SPUTUM ASSESSMENT W GRAM STAIN, RFLX TO RESP C

## 2017-05-08 LAB — CBC
HCT: 37.6 % — ABNORMAL LOW (ref 39.0–52.0)
Hemoglobin: 12.4 g/dL — ABNORMAL LOW (ref 13.0–17.0)
MCH: 31.9 pg (ref 26.0–34.0)
MCHC: 33 g/dL (ref 30.0–36.0)
MCV: 96.7 fL (ref 78.0–100.0)
Platelets: 223 10*3/uL (ref 150–400)
RBC: 3.89 MIL/uL — ABNORMAL LOW (ref 4.22–5.81)
RDW: 13.5 % (ref 11.5–15.5)
WBC: 13.5 10*3/uL — ABNORMAL HIGH (ref 4.0–10.5)

## 2017-05-08 LAB — HIV ANTIBODY (ROUTINE TESTING W REFLEX): HIV Screen 4th Generation wRfx: NONREACTIVE

## 2017-05-08 LAB — EXPECTORATED SPUTUM ASSESSMENT W REFEX TO RESP CULTURE

## 2017-05-08 MED ORDER — POTASSIUM CHLORIDE CRYS ER 20 MEQ PO TBCR
40.0000 meq | EXTENDED_RELEASE_TABLET | Freq: Once | ORAL | Status: AC
  Administered 2017-05-08: 40 meq via ORAL
  Filled 2017-05-08: qty 2

## 2017-05-08 MED ORDER — GUAIFENESIN ER 600 MG PO TB12: 600.0000 mg | ORAL_TABLET | Freq: Two times a day (BID) | ORAL | 0 refills | Status: DC | PRN

## 2017-05-08 MED ORDER — LEVOFLOXACIN 750 MG PO TABS
750.0000 mg | ORAL_TABLET | Freq: Every day | ORAL | Status: DC
  Administered 2017-05-08: 750 mg via ORAL
  Filled 2017-05-08: qty 1

## 2017-05-08 MED ORDER — LEVOFLOXACIN 750 MG PO TABS: 750.0000 mg | ORAL_TABLET | Freq: Every day | ORAL | 0 refills | Status: AC

## 2017-05-08 NOTE — Evaluation (Signed)
Occupational Therapy Evaluation and Discharge Patient Details Name: Dustin DraftFloyd T Bohl Jr. MRN: [id number] DOB: 04/04/1971 Today's Date: 05/08/2017    History of Present Illness Pt admitted with SOB and cough, hyponatremia. PMH: fall 2 months ago resulting in L shoulder injury and radial nerve palsy, ETOH abuse, Bipolar, heavy tobacco use.   Clinical Impression   Pt demonstrates s/s of L radial nerve palsy and uses a prefab wrist cock up splint he received in the ED when he injured his shoulder in a fall 2 months ago. Adjusted metal stays in splint and educated in stretching of L wrist and hand with pt returning demonstration. Pt is aware that return from nerve damage is slow. Pt asking about applying for disability/Medicaid so he could see a specialist, RN notified. Pt is overall functioning at a modified independent level in ADL and is independent in mobility.     Follow Up Recommendations  No OT follow up    Equipment Recommendations  None recommended by OT    Recommendations for Other Services       Precautions / Restrictions Precautions Precautions: None Restrictions Weight Bearing Restrictions: No      Mobility Bed Mobility Overal bed mobility: Modified Independent             General bed mobility comments: HOB up  Transfers Overall transfer level: Independent Equipment used: None                  Balance                                           ADL either performed or assessed with clinical judgement   ADL Overall ADL's : Modified independent                                             Vision Patient Visual Report: No change from baseline       Perception     Praxis      Pertinent Vitals/Pain Pain Assessment: No/denies pain     Hand Dominance Right   Extremity/Trunk Assessment Upper Extremity Assessment Upper Extremity Assessment: LUE deficits/detail LUE Deficits / Details: absent active wrist and  finger extension, has prefab wrist cock up splint, full PROM, pt routinely stretches LUE Coordination: decreased fine motor   Lower Extremity Assessment Lower Extremity Assessment: Overall WFL for tasks assessed   Cervical / Trunk Assessment Cervical / Trunk Assessment: Normal   Communication Communication Communication: No difficulties   Cognition   Behavior During Therapy: WFL for tasks assessed/performed Overall Cognitive Status: Within Functional Limits for tasks assessed                                     General Comments       Exercises     Shoulder Instructions      Home Living Family/patient expects to be discharged to:: Private residence Living Arrangements: Other relatives Available Help at Discharge: Available PRN/intermittently                         Home Equipment: None          Prior Functioning/Environment Level of  Independence: Independent        Comments: pt does not work        OT Problem List: Decreased range of motion;Decreased strength;Impaired UE functional use      OT Treatment/Interventions:      OT Goals(Current goals can be found in the care plan section) Acute Rehab OT Goals Patient Stated Goal: to be able to see a dr about his hand, pt is uninsured  OT Frequency:     Barriers to D/C:            Co-evaluation              AM-PAC PT "6 Clicks" Daily Activity     Outcome Measure Help from another person eating meals?: None Help from another person taking care of personal grooming?: None Help from another person toileting, which includes using toliet, bedpan, or urinal?: None Help from another person bathing (including washing, rinsing, drying)?: None Help from another person to put on and taking off regular upper body clothing?: None Help from another person to put on and taking off regular lower body clothing?: None 6 Click Score: 24   End of Session Nurse Communication: (wants to speak  to SW about applying for disability)  Activity Tolerance: Patient tolerated treatment well Patient left: (on toilet)  OT Visit Diagnosis: History of falling (Z91.81)                Time: 1030-1045 OT Time Calculation (min): 15 min Charges:  OT General Charges $OT Visit: 1 Visit OT Evaluation $OT Eval Low Complexity: 1 Low G-Codes: OT G-codes **NOT FOR INPATIENT CLASS** Functional Assessment Tool Used: Clinical judgement Functional Limitation: Self care Self Care Current Status (Z6109(G8987): At least 1 percent but less than 20 percent impaired, limited or restricted Self Care Discharge Status [id number]): At least 1 percent but less than 20 percent impaired, limited or restricted   05/08/2017 Martie RoundJulie Larrisha Babineau, OTR/L Pager: [phone]  Evern BioMayberry, Sabryn Preslar Lynn 05/08/2017, 10:50 AM

## 2017-05-08 NOTE — Discharge Summary (Signed)
Physician Discharge Summary  Dustin DraftFloyd T Rodier Jr. ZOX:[id number] DOB: 05/15/1971 DOA: 05/07/2017  PCP: Patient, No Pcp Per  Admit date: 05/07/2017 Discharge date: 05/08/2017  Admitted From: Home Disposition: Home  Recommendations for Outpatient Follow-up:  1. Follow up with PCP in 1 week ([facility] 2. Please obtain BMP/CBC in one week 3. Please follow up on the following pending results: non-expectorated culture  Home Health: None Equipment/Devices: None  Discharge Condition: Stable CODE STATUS: Full code Diet recommendation: Regular   Brief/Interim Summary:  Admission HPI written by Erick BlinksPratik D Shah, DO   Chief Complaint: Shortness of breath  HPI: Dustin DraftFloyd T Seide Jr. is a 46 y.o. male with medical history significant for bipolar disorder, depression, GERD, heavy tobacco abuse 1 pack a day, alcohol abuse about a 6 pack daily, depression, who has not been seen by a PCP in a prolonged period of time, presenting with one-week history of progressive shortness of breath and cough, forceful, productive, green colored, worse over the last day, complicated with an episode of heavy hemoptysis.  The time, the patient thought medical attention.  Other symptoms include chills, subjective fever, night sweats,  wheezing.  She any history of lung disease or cancer.  He denies any recent long distance trips.  He denies any sick contacts.  He denies any headaches or vision changes.  His appetite is decreased due to the current symptoms.  He has some nausea without vomiting.  No confusion was reported.   Of note, had a important issue during his presentation includes  decreased mobility, as well as having some falls without syncope or seizures.  In 1 of those event, the patient has had a shoulder injury, which has subsequent sequela on the left hand, with decreased range of motion.  He denies any decrease in sensation in the area.  He denies any other substance abuse, such  as cocaine, or crack, or marijuana  ED Course:  BP 118/73   Pulse (!) 107   Temp 98.4 F (36.9 C) (Oral)   Resp (!) 25   Ht 5\' 11"  (1.803 m)   Wt 95.3 kg (210 lb)   SpO2 96%   BMI 29.29 kg/m   At the ED, the patient was noted to have in his CBC a white count of 15.4.  Lactic acid 0.84. Chest x-ray was remarkable for a left upper lobe lingular infiltrate consistent with pneumonia, as well as a right middle lobe early pneumonia. Patient was placed on Rocephin and Zithromax.  Per patient report, he developed "reaction" to Rocephin. In addition, the patient received 1 dose of DuoNeb, and another dose of albuterol, with improvement of his symptoms. 1 L of IV normal saline was given as well. Blood and sputum cultures are pending.    Hospital course:  Multifocal pneumonia Community acquired pneumonia Non-expectorated gram stain significant for gram positive cocci and yeast. Patient initially empirically treated with ceftriaxone and azithromycin but was transitioned to Levaquin secondary to reported reaction (nausea and itching). Continue Levaquin for a 5 day total course.   Tobacco abuse Nicotine patch while inpatient.  Hyponatremia Mild. Improved with fluids. Repeat BMP as an outpatient.  Hypokalemia Potassium supplementation given.  Hemoptysis Secondary to frequent coughing. Patient on aspirin as an outpatient by personal choice. Discontinued aspirin on discharge. No recurrent hemoptysis.  Alcohol abuse/dependence CIWA while inpatient. Asymptomatic.  Chest pain Atypical. Transient. Chest CT significant for chronic rib fractures. No evidence of pulmonary embolism.  Left shoulder injury OT  consulted on admission. No OT follow-up.   Discharge Diagnoses:  Active Problems:   Tobacco abuse   Alcohol abuse   GERD (gastroesophageal reflux disease)   Depression   Bipolar disorder (HCC)   Community acquired pneumonia   Pneumonia    Discharge Instructions   Allergies  as of 05/08/2017      Reactions   Other Nausea Only   Pain medication-unsure which one   Penicillins Rash   Childhood reaction   Tramadol Hcl Nausea Only      Medication List    STOP taking these medications   aspirin 325 MG EC tablet   aspirin EC 81 MG tablet     TAKE these medications   benzonatate 100 MG capsule Commonly known as:  TESSALON Take 1 capsule (100 mg total) by mouth every 8 (eight) hours.   guaiFENesin 600 MG 12 hr tablet Commonly known as:  MUCINEX Take 1 tablet (600 mg total) 2 (two) times daily as needed by mouth for cough or to loosen phlegm.   ibuprofen 400 MG tablet Commonly known as:  ADVIL,MOTRIN Take 1 tablet (400 mg total) by mouth every 6 (six) hours as needed for moderate pain.   levofloxacin 750 MG tablet Commonly known as:  LEVAQUIN Take 1 tablet (750 mg total) daily for 3 days by mouth. Start taking on:  05/09/2017   SYSTANE OP Place 1 drop daily as needed into both eyes (dry eyes).      Follow-up Information    [facility]. Go to.   Why:  May 15, 2017 at 2 pm  Contact information: [address] [place] [address] [phone]         Allergies  Allergen Reactions  . Other Nausea Only    Pain medication-unsure which one  . Penicillins Rash    Childhood reaction  . Tramadol Hcl Nausea Only    Consultations:  None   Procedures/Studies: Dg Chest 2 View  Result Date: 05/08/2017 CLINICAL DATA:  Pneumonia short of breath EXAM: CHEST  2 VIEW COMPARISON:  05/07/2017 FINDINGS: Ill-defined airspace density in the lingula unchanged. Mild right lower lobe airspace disease medially also unchanged. Interval development of small right pleural effusion. Negative for heart failure. Chronic left rib fractures IMPRESSION: Bilateral airspace disease stable. Interval development small right pleural effusion. Electronically Signed   By: Marlan Palau M.D.   On: 05/08/2017 07:19    Dg Chest 2 View  Result Date: 05/07/2017 CLINICAL DATA:  Hemoptysis.  Smoking history. EXAM: CHEST  2 VIEW COMPARISON:  None. FINDINGS: The heart size is upper limits of normal. Left upper lobe pneumonia is present in the lingula. Mild right middle lobe airspace disease is present as well. There are no effusions. The lower lobes are clear. Superior endplate fracture at T5 is likely remote. Remote left-sided rib fractures are noted. The visualized soft tissues and bony thorax are otherwise unremarkable. IMPRESSION: 1. Lobar pneumonia involving the lingula of the left upper lobe. 2. Subtle airspace disease in the right middle lobe is also concerning for early lobar pneumonia. 3. T5 superior endplate compression fracture is likely remote. Electronically Signed   By: Marin Roberts M.D.   On: 05/07/2017 08:44   Ct Angio Chest Pe W Or Wo Contrast  Result Date: 05/07/2017 CLINICAL DATA:  46 year old male with chest pain, cough, and shortness of breath. EXAM: CT ANGIOGRAPHY CHEST WITH CONTRAST TECHNIQUE: Multidetector CT imaging of the chest was performed using the standard protocol  during bolus administration of intravenous contrast. Multiplanar CT image reconstructions and MIPs were obtained to evaluate the vascular anatomy. CONTRAST:  100mL ISOVUE-370 IOPAMIDOL (ISOVUE-370) INJECTION 76% COMPARISON:  Chest radiograph dated 05/07/2017 FINDINGS: Cardiovascular: Top-normal cardiac size. No pericardial effusion. The thoracic aorta is unremarkable. There is no CT evidence of pulmonary embolism. Mediastinum/Nodes: Top-normal right hilar lymph nodes, likely reactive. There is no mediastinal adenopathy. Esophagus and the thyroid gland are grossly unremarkable. No mediastinal fluid collection or hematoma. Lungs/Pleura: Focal areas of consolidative change in the lingula and right lower lobe most consistent with multifocal pneumonia. Areas of pulmonary infarct are less likely in the absence of pulmonary embolism.  There is thickening of the bronchial wall. There small bilateral pleural effusions. No pneumothorax. The central airways are patent. Upper Abdomen: No acute abnormality. Musculoskeletal: Mild chronic appearing compression deformity at T5-T7. No acute osseous pathology. Review of the MIP images confirms the above findings. IMPRESSION: 1. No CT evidence of pulmonary embolism. 2. Findings most consistent with multifocal pneumonia. Clinical correlation and follow-up recommended. Electronically Signed   By: Elgie CollardArash  Radparvar M.D.   On: 05/07/2017 18:57      Subjective: Cough improved.  Discharge Exam: Vitals:   05/08/17 0637 05/08/17 0710  BP: (!) 133/91 131/80  Pulse: (!) 102 (!) 109  Resp: (!) 22 (!) 30  Temp: 99.5 F (37.5 C) 98.6 F (37 C)  SpO2: 95% 96%   Vitals:   05/07/17 1810 05/08/17 0026 05/08/17 0637 05/08/17 0710  BP: (!) 114/95 127/81 (!) 133/91 131/80  Pulse: (!) 103 98 (!) 102 (!) 109  Resp: (!) 22 (!) 21 (!) 22 (!) 30  Temp: 99.5 F (37.5 C) 99.3 F (37.4 C) 99.5 F (37.5 C) 98.6 F (37 C)  TempSrc: Oral Oral Oral Oral  SpO2: 99% 93% 95% 96%  Weight:      Height:        General: Pt is alert, awake, not in acute distress Cardiovascular: RRR, S1/S2 +, no rubs, no gallops Respiratory: CTA bilaterally, no wheezing, no rhonchi Abdominal: Soft, NT, ND, bowel sounds + Extremities: no edema, no cyanosis    The results of significant diagnostics from this hospitalization (including imaging, microbiology, ancillary and laboratory) are listed below for reference.     Microbiology: Recent Results (from the past 240 hour(s))  Culture, sputum-assessment     Status: None   Collection Time: 05/08/17  6:41 AM  Result Value Ref Range Status   Specimen Description EXPECTORATED SPUTUM  Final   Special Requests NONE  Final   Sputum evaluation THIS SPECIMEN IS ACCEPTABLE FOR SPUTUM CULTURE  Final   Report Status 05/08/2017 FINAL  Final  Culture, respiratory  (NON-Expectorated)     Status: None (Preliminary result)   Collection Time: 05/08/17  6:41 AM  Result Value Ref Range Status   Specimen Description EXPECTORATED SPUTUM  Final   Special Requests NONE Reflexed from [id number]  Final   Gram Stain   Final    ABUNDANT WBC PRESENT, PREDOMINANTLY MONONUCLEAR NO SQUAMOUS EPITHELIAL CELLS SEEN FEW GRAM POSITIVE COCCI IN CHAINS FEW YEAST WITH PSEUDOHYPHAE    Culture PENDING  Incomplete   Report Status PENDING  Incomplete     Labs: BNP (last 3 results) No results for input(s): BNP in the last 8760 hours. Basic Metabolic Panel: Recent Labs  Lab 05/07/17 0956 05/07/17 1518 05/07/17 2050 05/08/17 0235  NA 129*  --  132* 133*  K 3.5  --   --  3.2*  CL 97* 102  --  102  CO2 20*  --   --  24  GLUCOSE 97  --   --  110*  BUN 5*  --   --  6  CREATININE 0.60*  --   --  0.57*  CALCIUM 8.5*  --   --  8.0*   Liver Function Tests: Recent Labs  Lab 05/08/17 0235  AST 24  ALT 50  ALKPHOS 60  BILITOT 0.9  PROT 5.9*  ALBUMIN 2.6*   No results for input(s): LIPASE, AMYLASE in the last 168 hours. No results for input(s): AMMONIA in the last 168 hours. CBC: Recent Labs  Lab 05/07/17 0956 05/08/17 0235  WBC 15.4* 13.5*  NEUTROABS 11.3*  --   HGB 13.6 12.4*  HCT 39.7 37.6*  MCV 95.7 96.7  PLT 210 223   Cardiac Enzymes: No results for input(s): CKTOTAL, CKMB, CKMBINDEX, TROPONINI in the last 168 hours. BNP: Invalid input(s): POCBNP CBG: No results for input(s): GLUCAP in the last 168 hours. D-Dimer No results for input(s): DDIMER in the last 72 hours. Hgb A1c Recent Labs    05/07/17 1518  HGBA1C 5.2   Lipid Profile Recent Labs    05/07/17 1518  CHOL 117  HDL 50  LDLCALC 57  TRIG 51  CHOLHDL 2.3   Thyroid function studies Recent Labs    05/07/17 1518  TSH 0.837   Anemia work up No results for input(s): VITAMINB12, FOLATE, FERRITIN, TIBC, IRON, RETICCTPCT in the last 72 hours. Urinalysis    Component Value Date/Time    LABSPEC >=1.030 01/15/2007 1708   PHURINE 5.5 01/15/2007 1708   GLUCOSEU NEGATIVE 01/15/2007 1708   HGBUR NEGATIVE 01/15/2007 1708   BILIRUBINUR SMALL (A) 01/15/2007 1708   KETONESUR NEGATIVE 01/15/2007 1708   PROTEINUR NEGATIVE 01/15/2007 1708   UROBILINOGEN 0.2 01/15/2007 1708   NITRITE NEGATIVE 01/15/2007 1708   LEUKOCYTESUR  01/15/2007 1708    NEGATIVE  Biochemical Testing Only. Please order routine urinalysis from main lab if confirmatory testing is needed.   Sepsis Labs Invalid input(s): PROCALCITONIN,  WBC,  LACTICIDVEN Microbiology Recent Results (from the past 240 hour(s))  Culture, sputum-assessment     Status: None   Collection Time: 05/08/17  6:41 AM  Result Value Ref Range Status   Specimen Description EXPECTORATED SPUTUM  Final   Special Requests NONE  Final   Sputum evaluation THIS SPECIMEN IS ACCEPTABLE FOR SPUTUM CULTURE  Final   Report Status 05/08/2017 FINAL  Final  Culture, respiratory (NON-Expectorated)     Status: None (Preliminary result)   Collection Time: 05/08/17  6:41 AM  Result Value Ref Range Status   Specimen Description EXPECTORATED SPUTUM  Final   Special Requests NONE Reflexed from [id number]  Final   Gram Stain   Final    ABUNDANT WBC PRESENT, PREDOMINANTLY MONONUCLEAR NO SQUAMOUS EPITHELIAL CELLS SEEN FEW GRAM POSITIVE COCCI IN CHAINS FEW YEAST WITH PSEUDOHYPHAE    Culture PENDING  Incomplete   Report Status PENDING  Incomplete     Time coordinating discharge: Over 30 minutes  SIGNED:   Jacquelin Hawking, MD [facility] 05/08/2017, 1:19 PM Pager [phone]  If 7PM-7AM, please contact night-coverage [email or web address] Password [email or web address]

## 2017-05-08 NOTE — Discharge Instructions (Addendum)
Community-Acquired Pneumonia, Adult °Pneumonia is an infection of the lungs. There are different types of pneumonia. One type can develop while a person is in a hospital. A different type, called community-acquired pneumonia, develops in people who are not, or have not recently been, in the hospital or other health care facility. °What are the causes? °Pneumonia may be caused by bacteria, viruses, or funguses. Community-acquired pneumonia is often caused by Streptococcus pneumonia bacteria. These bacteria are often passed from one person to another by breathing in droplets from the cough or sneeze of an infected person. °What increases the risk? °The condition is more likely to develop in: °· People who have chronic diseases, such as chronic obstructive pulmonary disease (COPD), asthma, congestive heart failure, cystic fibrosis, diabetes, or kidney disease. °· People who have early-stage or late-stage HIV. °· People who have sickle cell disease. °· People who have had their spleen removed (splenectomy). °· People who have poor dental hygiene. °· People who have medical conditions that increase the risk of breathing in (aspirating) secretions their own mouth and nose. °· People who have a weakened immune system (immunocompromised). °· People who smoke. °· People who travel to areas where pneumonia-causing germs commonly exist. °· People who are around animal habitats or animals that have pneumonia-causing germs, including birds, bats, rabbits, cats, and farm animals. ° °What are the signs or symptoms? °Symptoms of this condition include: °· A dry cough. °· A wet (productive) cough. °· Fever. °· Sweating. °· Chest pain, especially when breathing deeply or coughing. °· Rapid breathing or difficulty breathing. °· Shortness of breath. °· Shaking chills. °· Fatigue. °· Muscle aches. ° °How is this diagnosed? °Your health care provider will take a medical history and perform a physical exam. You may also have other tests,  including: °· Imaging studies of your chest, including X-rays. °· Tests to check your blood oxygen level and other blood gases. °· Other tests on blood, mucus (sputum), fluid around your lungs (pleural fluid), and urine. ° °If your pneumonia is severe, other tests may be done to identify the specific cause of your illness. °How is this treated? °The type of treatment that you receive depends on many factors, such as the cause of your pneumonia, the medicines you take, and other medical conditions that you have. For most adults, treatment and recovery from pneumonia may occur at home. In some cases, treatment must happen in a hospital. Treatment may include: °· Antibiotic medicines, if the pneumonia was caused by bacteria. °· Antiviral medicines, if the pneumonia was caused by a virus. °· Medicines that are given by mouth or through an IV tube. °· Oxygen. °· Respiratory therapy. ° °Although rare, treating severe pneumonia may include: °· Mechanical ventilation. This is done if you are not breathing well on your own and you cannot maintain a safe blood oxygen level. °· Thoracentesis. This procedure removes fluid around one lung or both lungs to help you breathe better. ° °Follow these instructions at home: °· Take over-the-counter and prescription medicines only as told by your health care provider. °? Only take cough medicine if you are losing sleep. Understand that cough medicine can prevent your body’s natural ability to remove mucus from your lungs. °? If you were prescribed an antibiotic medicine, take it as told by your health care provider. Do not stop taking the antibiotic even if you start to feel better. °· Sleep in a semi-upright position at night. Try sleeping in a reclining chair, or place a few pillows under your head. °· Do not use tobacco products, including cigarettes, chewing   tobacco, and e-cigarettes. If you need help quitting, ask your health care provider. °· Drink enough water to keep your urine  clear or pale yellow. This will help to thin out mucus secretions in your lungs. °How is this prevented? °There are ways that you can decrease your risk of developing community-acquired pneumonia. Consider getting a pneumococcal vaccine if: °· You are older than 45 years of age. °· You are older than 45 years of age and are undergoing cancer treatment, have chronic lung disease, or have other medical conditions that affect your immune system. Ask your health care provider if this applies to you. ° °There are different types and schedules of pneumococcal vaccines. Ask your health care provider which vaccination option is best for you. °You may also prevent community-acquired pneumonia if you take these actions: °· Get an influenza vaccine every year. Ask your health care provider which type of influenza vaccine is best for you. °· Go to the dentist on a regular basis. °· Wash your hands often. Use hand sanitizer if soap and water are not available. ° °Contact a health care provider if: °· You have a fever. °· You are losing sleep because you cannot control your cough with cough medicine. °Get help right away if: °· You have worsening shortness of breath. °· You have increased chest pain. °· Your sickness becomes worse, especially if you are an older adult or have a weakened immune system. °· You cough up blood. °This information is not intended to replace advice given to you by your health care provider. Make sure you discuss any questions you have with your health care provider. °Document Released: 06/06/2005 Document Revised: 10/15/2015 Document Reviewed: 10/01/2014 °[employer] Interactive Patient Education © 2017 [employer] ° °

## 2017-05-08 NOTE — Care Management Note (Signed)
Case Management Note  Patient Details  Name: Dustin Olsen T Mahler Jr. MRN: [id number] Date of Birth: 07/20/1970  Subjective/Objective:                    Action/Plan:  Provided patient with MATCH letter and [facility] May 15, 2017 at 2 pm.   Patient voiced understanding Expected Discharge Date:                  Expected Discharge Plan:  Home/Self Care  In-House Referral:     Discharge planning Services  CM Consult, [facility], [facility], Follow-up appt scheduled, Medication Assistance  Post Acute Care Choice:    Choice offered to:  Patient  DME Arranged:    DME Agency:     HH Arranged:    HH Agency:     Status of Service:  Completed, signed off  If discussed at [employer] Meetings, dates discussed:    Additional Comments:  Kingsley PlanWile, Linh Hedberg Marie, RN 05/08/2017, 12:05 PM

## 2017-05-08 NOTE — Progress Notes (Signed)
Patient experiencing sharp localized sudden pain in left chest area. Said it felt like a muscle spasm but he had never felt like that before. Vital signs taken and are stable, slightly tachycardic. Given some ketorolac and will reassess.

## 2017-05-08 NOTE — Progress Notes (Signed)
Dustin Olsen. to be D/C'd  per MD order. Discussed with the patient and all questions fully answered.  VSS, Skin clean, dry and intact without evidence of skin break down, no evidence of skin tears noted.  IV catheter discontinued intact. Site without signs and symptoms of complications. Dressing and pressure applied.  An After Visit Summary was printed and given to the patient. Patient received prescription.  D/c education completed with patient/family including follow up instructions, medication list, d/c activities limitations if indicated, with other d/c instructions as indicated by MD - patient able to verbalize understanding, all questions fully answered.   Patient instructed to return to ED, call 911, or call MD for any changes in condition.   Patient ambulating out per request, and D/C home via private auto.

## 2017-05-09 LAB — URINE CULTURE: Culture: NO GROWTH

## 2017-05-09 LAB — LEGIONELLA PNEUMOPHILA SEROGP 1 UR AG: L. pneumophila Serogp 1 Ur Ag: NEGATIVE

## 2017-05-10 LAB — CULTURE, RESPIRATORY W GRAM STAIN

## 2017-05-10 LAB — CULTURE, RESPIRATORY: Culture: NORMAL

## 2017-05-15 ENCOUNTER — Ambulatory Visit: Payer: Self-pay | Attending: Internal Medicine | Admitting: Physician Assistant

## 2017-05-15 ENCOUNTER — Other Ambulatory Visit: Payer: Self-pay

## 2017-05-15 VITALS — BP 142/89 | HR 87 | Temp 98.8°F | Resp 16 | Ht 68.0 in | Wt 200.8 lb

## 2017-05-15 DIAGNOSIS — F1020 Alcohol dependence, uncomplicated: Secondary | ICD-10-CM | POA: Insufficient documentation

## 2017-05-15 DIAGNOSIS — R030 Elevated blood-pressure reading, without diagnosis of hypertension: Secondary | ICD-10-CM

## 2017-05-15 DIAGNOSIS — F17200 Nicotine dependence, unspecified, uncomplicated: Secondary | ICD-10-CM

## 2017-05-15 DIAGNOSIS — Z720 Tobacco use: Secondary | ICD-10-CM | POA: Insufficient documentation

## 2017-05-15 DIAGNOSIS — F319 Bipolar disorder, unspecified: Secondary | ICD-10-CM | POA: Insufficient documentation

## 2017-05-15 DIAGNOSIS — Z880 Allergy status to penicillin: Secondary | ICD-10-CM | POA: Insufficient documentation

## 2017-05-15 DIAGNOSIS — J189 Pneumonia, unspecified organism: Secondary | ICD-10-CM | POA: Insufficient documentation

## 2017-05-15 DIAGNOSIS — J181 Lobar pneumonia, unspecified organism: Secondary | ICD-10-CM

## 2017-05-15 DIAGNOSIS — K219 Gastro-esophageal reflux disease without esophagitis: Secondary | ICD-10-CM | POA: Insufficient documentation

## 2017-05-15 DIAGNOSIS — Z79899 Other long term (current) drug therapy: Secondary | ICD-10-CM | POA: Insufficient documentation

## 2017-05-15 DIAGNOSIS — F419 Anxiety disorder, unspecified: Secondary | ICD-10-CM | POA: Insufficient documentation

## 2017-05-15 NOTE — Patient Instructions (Signed)
Low salt intake  Go to a pharmacy a couple of times between now and your next appointment and have your blood pressure checked

## 2017-05-15 NOTE — Progress Notes (Signed)
F/u visit for PNA

## 2017-05-15 NOTE — Progress Notes (Signed)
Dustin Olsen  UEA:[id number]  YNW:[id number]  DOB - 11/10/1970  Chief Complaint  Patient presents with  . Hospitalization Follow-up       Subjective:   Dustin Olsen is a 46 y.o. male here today for establishment of care. He has a past medical history of smoking, alcohol use, bipolar disorder, depression and reflux. He has not been seen by a doctor in at least a decade he states. On 05/07/2017 he went to the emergency room with increasing cough for one week. He also was having some shortness of breath and dyspnea. He was having green phlegm and sometimes coughing up blood. His chest x-ray was consistent with multilobular pneumonia especially the left upper lobe. He was a little tachycardic. His white blood cell count was 15.4. His sodium was 129. His lactic acid was 0.84. He was treated with IV fluids, IV antibiotics and nebs. He was admitted by the internal medicine team. He underwent the CIWA protocol. He had a CT angiogram of the chest which showed no evidence of pulmonary emboli.  He had 3 days left of antibiotics upon discharge. He completed the course. No fevers. No chills. Less of a cough. No sputum production. No shortness of breath. He continues to smoke. He continues to drink alcohol in excess. No new complaints today.  ROS: GEN: denies fever or chills, denies change in weight Skin: denies lesions or rashes HEENT: denies headache, earache, epistaxis, sore throat, or neck pain LUNGS: denies SHOB, dyspnea, PND, orthopnea CV: denies CP or palpitations ABD: denies abd pain, N or V EXT: denies muscle spasms or swelling; no pain in lower ext, no weakness NEURO: denies numbness or tingling, denies sz, stroke or TIA   ALLERGIES: Allergies  Allergen Reactions  . Other Nausea Only    Pain medication-unsure which one  . Penicillins Rash    Childhood reaction  . Tramadol Hcl Nausea Only    PAST MEDICAL HISTORY: Past Medical History:  Diagnosis Date  . Anxiety   . Bipolar  disorder (HCC)   . Depression   . Dyspnea   . GERD (gastroesophageal reflux disease)     PAST SURGICAL HISTORY: Past Surgical History:  Procedure Laterality Date  . NO PAST SURGERIES      MEDICATIONS AT HOME: Prior to Admission medications   Medication Sig Start Date End Date Taking? Authorizing Provider  guaiFENesin (MUCINEX) 600 MG 12 hr tablet Take 1 tablet (600 mg total) 2 (two) times daily as needed by mouth for cough or to loosen phlegm. 05/08/17  Yes Narda BondsNettey, Ralph A, MD  benzonatate (TESSALON) 100 MG capsule Take 1 capsule (100 mg total) by mouth every 8 (eight) hours. Patient not taking: Reported on 05/07/2017 05/17/16   Mesner, Barbara CowerJason, MD  ibuprofen (ADVIL,MOTRIN) 400 MG tablet Take 1 tablet (400 mg total) by mouth every 6 (six) hours as needed for moderate pain. Patient not taking: Reported on 05/07/2017 10/08/15   Linwood DibblesKnapp, Jon, MD  Polyethyl Glycol-Propyl Glycol (SYSTANE OP) Place 1 drop daily as needed into both eyes (dry eyes).    [provider]    Family History  Problem Relation Age of Onset  . Diabetes Mother   . Hyperlipidemia Father   . Hypertension Father   . Coronary artery disease Father        Stents placed  . Heart disease Maternal Grandmother   . Heart disease Paternal Grandmother    Social-divorced, 1 daughter (1 son that passed), smoker and drinker; not currently working  Objective:  Vitals:   05/15/17 1357  BP: (!) 142/89  Pulse: 87  Resp: 16  Temp: 98.8 F (37.1 C)  TempSrc: Oral  SpO2: 97%  Weight: 200 lb 12.8 oz (91.1 kg)  Height: 5\' 8"  (1.727 m)    Exam General appearance : Awake, alert, not in any distress. Speech Clear. Not toxic looking HEENT: Atraumatic and Normocephalic, pupils equally reactive to light and accomodation Neck: supple, no JVD. No cervical lymphadenopathy.  Chest:Good air entry bilaterally, no added sounds  CVS: S1 S2 regular, no murmurs.  Abdomen: Bowel sounds present, Non tender and not distended with  no guarding, rigidity or rebound. Extremities: B/L Lower Ext shows no edema, both legs are warm to touch Neurology: Awake alert, and oriented X 3, CN II-XII intact, Non focal   Data Review Lab Results  Component Value Date   HGBA1C 5.2 05/07/2017   HGBA1C  04/16/2008    5.9 (NOTE)   The ADA recommends the following therapeutic goal for glycemic   control related to Hgb A1C measurement:   Goal of Therapy:   < 7.0% Hgb A1C   Reference: American Diabetes Association: Clinical Practice   Recommendations 2008, Diabetes Care,  2008, 31:(Suppl 1).     Assessment & Plan  1. LUL PNA  -antibiotics completed  -stop smoking 2. Elevated Blood Pressure  -re check in 2 weeks   -ETOH cessation  -low salt diet  -smoking cessation 3. ETOH Dependence  -offfered SW visit today, he declined  -cessation encouraged 4. Smoker  -not ready to QUIT   Return in about 2 weeks (around 05/29/2017).  The patient was given clear instructions to go to ER or return to medical center if symptoms don't improve, worsen or new problems develop. The patient verbalized understanding. The patient was told to call to get lab results if they haven't heard anything in the next week.    This note has been created with Education officer, [employer]Dragon speech recognition software and smart phrase technology. Any transcriptional errors are unintentional.    Scot Juniffany Bush Murdoch, PA-C [facility] [place], [place] [phone]   05/15/2017, 2:21 PM

## 2017-05-22 ENCOUNTER — Ambulatory Visit: Payer: Self-pay

## 2017-05-29 ENCOUNTER — Ambulatory Visit: Payer: Self-pay

## 2017-06-01 ENCOUNTER — Ambulatory Visit: Payer: Self-pay | Admitting: Internal Medicine

## 2017-06-27 ENCOUNTER — Emergency Department (HOSPITAL_COMMUNITY): Payer: Self-pay

## 2017-06-27 ENCOUNTER — Encounter (HOSPITAL_COMMUNITY): Payer: Self-pay | Admitting: *Deleted

## 2017-06-27 ENCOUNTER — Emergency Department (HOSPITAL_COMMUNITY)
Admission: EM | Admit: 2017-06-27 | Discharge: 2017-06-27 | Disposition: A | Discharge status: Home / Self Care | Payer: Self-pay | Attending: Emergency Medicine | Admitting: Emergency Medicine

## 2017-06-27 ENCOUNTER — Other Ambulatory Visit: Payer: Self-pay

## 2017-06-27 DIAGNOSIS — Y999 Unspecified external cause status: Secondary | ICD-10-CM | POA: Insufficient documentation

## 2017-06-27 DIAGNOSIS — M545 Low back pain: Secondary | ICD-10-CM | POA: Insufficient documentation

## 2017-06-27 DIAGNOSIS — Y929 Unspecified place or not applicable: Secondary | ICD-10-CM | POA: Insufficient documentation

## 2017-06-27 DIAGNOSIS — S0990XA Unspecified injury of head, initial encounter: Secondary | ICD-10-CM | POA: Insufficient documentation

## 2017-06-27 DIAGNOSIS — F17210 Nicotine dependence, cigarettes, uncomplicated: Secondary | ICD-10-CM | POA: Insufficient documentation

## 2017-06-27 DIAGNOSIS — W0110XA Fall on same level from slipping, tripping and stumbling with subsequent striking against unspecified object, initial encounter: Secondary | ICD-10-CM | POA: Insufficient documentation

## 2017-06-27 DIAGNOSIS — R1010 Upper abdominal pain, unspecified: Secondary | ICD-10-CM | POA: Insufficient documentation

## 2017-06-27 DIAGNOSIS — S2241XA Multiple fractures of ribs, right side, initial encounter for closed fracture: Secondary | ICD-10-CM | POA: Insufficient documentation

## 2017-06-27 DIAGNOSIS — Y939 Activity, unspecified: Secondary | ICD-10-CM | POA: Insufficient documentation

## 2017-06-27 LAB — I-STAT CHEM 8, ED
BUN: 7 mg/dL (ref 6–20)
Calcium, Ion: 1.2 mmol/L (ref 1.15–1.40)
Chloride: 102 mmol/L (ref 101–111)
Creatinine, Ser: 0.6 mg/dL — ABNORMAL LOW (ref 0.61–1.24)
Glucose, Bld: 102 mg/dL — ABNORMAL HIGH (ref 65–99)
HCT: 44 % (ref 39.0–52.0)
Hemoglobin: 15 g/dL (ref 13.0–17.0)
Potassium: 3.8 mmol/L (ref 3.5–5.1)
Sodium: 138 mmol/L (ref 135–145)
TCO2: 25 mmol/L (ref 22–32)

## 2017-06-27 MED ORDER — OXYCODONE-ACETAMINOPHEN 5-325 MG PO TABS
2.0000 | ORAL_TABLET | Freq: Once | ORAL | Status: AC
  Administered 2017-06-27: 2 via ORAL
  Filled 2017-06-27: qty 2

## 2017-06-27 MED ORDER — IOPAMIDOL (ISOVUE-300) INJECTION 61%
INTRAVENOUS | Status: AC
  Administered 2017-06-27: 100 mL
  Filled 2017-06-27: qty 100

## 2017-06-27 MED ORDER — OXYCODONE-ACETAMINOPHEN 5-325 MG PO TABS: 1.0000 | ORAL_TABLET | ORAL | 0 refills | Status: DC | PRN

## 2017-06-27 NOTE — Discharge Instructions (Signed)
Use incentive spirometer every 15 minutes while awake Return if fever,cough, or worsened shortness of breath Recheck with your doctor one week

## 2017-06-27 NOTE — ED Triage Notes (Signed)
States he slipped and fell on Friday  Landing on his right side. C/o increased pain and cough
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
## 2017-09-10 ENCOUNTER — Encounter (HOSPITAL_COMMUNITY): Payer: Self-pay | Admitting: Emergency Medicine

## 2017-09-10 ENCOUNTER — Emergency Department (HOSPITAL_COMMUNITY): Payer: Self-pay

## 2017-09-10 ENCOUNTER — Emergency Department (HOSPITAL_COMMUNITY)
Admission: EM | Admit: 2017-09-10 | Discharge: 2017-09-11 | Disposition: A | Discharge status: Home / Self Care | Payer: Self-pay | Attending: Emergency Medicine | Admitting: Emergency Medicine

## 2017-09-10 DIAGNOSIS — F10920 Alcohol use, unspecified with intoxication, uncomplicated: Secondary | ICD-10-CM

## 2017-09-10 DIAGNOSIS — Y999 Unspecified external cause status: Secondary | ICD-10-CM | POA: Insufficient documentation

## 2017-09-10 DIAGNOSIS — S0990XA Unspecified injury of head, initial encounter: Secondary | ICD-10-CM

## 2017-09-10 DIAGNOSIS — Z23 Encounter for immunization: Secondary | ICD-10-CM | POA: Insufficient documentation

## 2017-09-10 DIAGNOSIS — Y9389 Activity, other specified: Secondary | ICD-10-CM | POA: Insufficient documentation

## 2017-09-10 DIAGNOSIS — W01190A Fall on same level from slipping, tripping and stumbling with subsequent striking against furniture, initial encounter: Secondary | ICD-10-CM | POA: Insufficient documentation

## 2017-09-10 DIAGNOSIS — Y929 Unspecified place or not applicable: Secondary | ICD-10-CM | POA: Insufficient documentation

## 2017-09-10 DIAGNOSIS — F17210 Nicotine dependence, cigarettes, uncomplicated: Secondary | ICD-10-CM | POA: Insufficient documentation

## 2017-09-10 DIAGNOSIS — F10229 Alcohol dependence with intoxication, unspecified: Secondary | ICD-10-CM | POA: Insufficient documentation

## 2017-09-10 DIAGNOSIS — S0101XA Laceration without foreign body of scalp, initial encounter: Secondary | ICD-10-CM | POA: Insufficient documentation

## 2017-09-10 LAB — CBC WITH DIFFERENTIAL/PLATELET
Basophils Absolute: 0.1 10*3/uL (ref 0.0–0.1)
Basophils Relative: 2 %
Eosinophils Absolute: 0.3 10*3/uL (ref 0.0–0.7)
Eosinophils Relative: 5 %
HCT: 43.6 % (ref 39.0–52.0)
Hemoglobin: 14.5 g/dL (ref 13.0–17.0)
Lymphocytes Relative: 45 %
Lymphs Abs: 2.7 10*3/uL (ref 0.7–4.0)
MCH: 32.4 pg (ref 26.0–34.0)
MCHC: 33.3 g/dL (ref 30.0–36.0)
MCV: 97.3 fL (ref 78.0–100.0)
Monocytes Absolute: 0.3 10*3/uL (ref 0.1–1.0)
Monocytes Relative: 6 %
Neutro Abs: 2.5 10*3/uL (ref 1.7–7.7)
Neutrophils Relative %: 42 %
Platelets: 298 10*3/uL (ref 150–400)
RBC: 4.48 MIL/uL (ref 4.22–5.81)
RDW: 14 % (ref 11.5–15.5)
WBC: 5.9 10*3/uL (ref 4.0–10.5)

## 2017-09-10 LAB — COMPREHENSIVE METABOLIC PANEL
ALT: 15 U/L — ABNORMAL LOW (ref 17–63)
AST: 20 U/L (ref 15–41)
Albumin: 3.5 g/dL (ref 3.5–5.0)
Alkaline Phosphatase: 85 U/L (ref 38–126)
Anion gap: 12 (ref 5–15)
BUN: <5 mg/dL — ABNORMAL LOW (ref 6–20)
CO2: 22 mmol/L (ref 22–32)
Calcium: 8.4 mg/dL — ABNORMAL LOW (ref 8.9–10.3)
Chloride: 102 mmol/L (ref 101–111)
Creatinine, Ser: 0.63 mg/dL (ref 0.61–1.24)
GFR calc Af Amer: >60 mL/min (ref >60)
GFR calc non Af Amer: >60 mL/min (ref >60)
Glucose, Bld: 123 mg/dL — ABNORMAL HIGH (ref 65–99)
Potassium: 3.9 mmol/L (ref 3.5–5.1)
Sodium: 136 mmol/L (ref 135–145)
Total Bilirubin: 0.5 mg/dL (ref 0.3–1.2)
Total Protein: 6.5 g/dL (ref 6.5–8.1)

## 2017-09-10 LAB — ETHANOL: Alcohol, Ethyl (B): 346 mg/dL (ref <10)

## 2017-09-10 MED ORDER — FENTANYL CITRATE (PF) 100 MCG/2ML IJ SOLN
50.0000 ug | Freq: Once | INTRAMUSCULAR | Status: AC
  Administered 2017-09-10: 50 ug via INTRAVENOUS
  Filled 2017-09-10: qty 2

## 2017-09-10 MED ORDER — TETANUS-DIPHTH-ACELL PERTUSSIS 5-2.5-18.5 LF-MCG/0.5 IM SUSP
0.5000 mL | Freq: Once | INTRAMUSCULAR | Status: AC
  Administered 2017-09-10: 0.5 mL via INTRAMUSCULAR
  Filled 2017-09-10: qty 0.5

## 2017-09-10 NOTE — ED Provider Notes (Addendum)
[facility] Provider Note   CSN: [id number] Arrival date & time: 09/10/17  2240     History   Chief Complaint Chief Complaint  Patient presents with  . Fall    Scalp Laceration   . Alcohol Intoxication    HPI Dustin DraftFloyd T Beranek Jr. is a 47 y.o. male.  HPI  47 year old male comes in with chief complaint of fall. Patient has history of bipolar disorder, GERD and he is not on any anticoagulation.  According to the patient he lost his balance earlier today and fell in his bedroom and in the process striking his head against a dresser.  Patient had positive LOC.  Patient admits to heavy alcohol use use today.  According to EMS when they arrived there was large amount of blood loss. Patient thinks that his tetanus shot was more than 10 years ago now.  Review of system is negative for any new arm pain, leg pain, back pain, new numbness, tingling, focal weakness or vision changes.  Patient has chronic back pain and neck pain for the last 2 weeks.  Past Medical History:  Diagnosis Date  . Anxiety   . Bipolar disorder (HCC)   . Depression   . Dyspnea   . GERD (gastroesophageal reflux disease)     Patient Active Problem List   Diagnosis Date Noted  . Depression 05/07/2017  . Bipolar disorder (HCC) 05/07/2017  . Community acquired pneumonia 05/07/2017  . Pneumonia 05/07/2017  . Chest pain 05/13/2012  . Tobacco abuse 05/10/2012  . Alcohol abuse 05/10/2012  . Transaminitis 05/10/2012  . GERD (gastroesophageal reflux disease) 05/10/2012    Past Surgical History:  Procedure Laterality Date  . FOOT SURGERY    . NO PAST SURGERIES          Home Medications    Prior to Admission medications   Medication Sig Start Date End Date Taking? Authorizing Provider  bacitracin ointment Apply 1 application topically 2 (two) times daily. 09/11/17   Derwood KaplanNanavati, Sharifah Champine, MD  benzonatate (TESSALON) 100 MG capsule Take 1 capsule (100 mg total) by mouth every  8 (eight) hours. Patient not taking: Reported on 05/07/2017 05/17/16   Mesner, Barbara CowerJason, MD  guaiFENesin (MUCINEX) 600 MG 12 hr tablet Take 1 tablet (600 mg total) 2 (two) times daily as needed by mouth for cough or to loosen phlegm. Patient not taking: Reported on 06/27/2017 05/08/17   Narda BondsNettey, Ralph A, MD  ibuprofen (ADVIL,MOTRIN) 400 MG tablet Take 1 tablet (400 mg total) by mouth every 6 (six) hours as needed for moderate pain. Patient not taking: Reported on 05/07/2017 10/08/15   Linwood DibblesKnapp, Jon, MD  oxyCODONE-acetaminophen (PERCOCET/ROXICET) 5-325 MG tablet Take 1 tablet by mouth every 4 (four) hours as needed for severe pain. Patient not taking: Reported on 09/10/2017 06/27/17   Margarita Grizzleay, Danielle, MD    Family History Family History  Problem Relation Age of Onset  . Diabetes Mother   . Hyperlipidemia Father   . Hypertension Father   . Coronary artery disease Father        Stents placed  . Heart disease Maternal Grandmother   . Heart disease Paternal Grandmother     Social History Social History   Tobacco Use  . Smoking status: Current Every Day Smoker    Packs/day: 0.00  . Smokeless tobacco: Never Used  Substance Use Topics  . Alcohol use: Yes    Alcohol/week: 3.6 oz    Types: 6 Cans of beer per week  Comment: 6 pack per day  . Drug use: No     Allergies   Penicillins and Tramadol hcl   Review of Systems Review of Systems  Constitutional: Positive for activity change. Negative for chills and fever.  Eyes: Negative for visual disturbance.  Respiratory: Negative for cough, chest tightness and shortness of breath.   Cardiovascular: Negative for chest pain.  Gastrointestinal: Negative for abdominal distention and abdominal pain.  Genitourinary: Negative for difficulty urinating, dysuria and enuresis.  Musculoskeletal: Negative for neck pain and neck stiffness.  Skin: Negative for rash.  Allergic/Immunologic: Negative for immunocompromised state.  Neurological: Positive for  syncope and headaches. Negative for dizziness, seizures, facial asymmetry and light-headedness.  Hematological: Bruises/bleeds easily.  Psychiatric/Behavioral: Negative for confusion.  All other systems reviewed and are negative.    Physical Exam Updated Vital Signs BP 114/78 (BP Location: Right Arm)   Pulse 90   Temp 98 F (36.7 C) (Oral)   Resp 20   Ht 5\' 11"  (1.803 m)   Wt 90.7 kg (200 lb)   SpO2 94%   BMI 27.89 kg/m   Physical Exam  Constitutional: He is oriented to person, place, and time. He appears well-developed.  HENT:  Head: Atraumatic.  Patient has about 10 cm laceration to his posterior scalp. No active bleeding.  Large amount of dried blood appreciated.  Eyes: Pupils are equal, round, and reactive to light. EOM are normal.  Neck: Neck supple.  Cardiovascular: Normal rate.  Pulmonary/Chest: Effort normal.  Abdominal: Soft. There is no tenderness.  Musculoskeletal:  Head to toe evaluation shows no hematoma, bleeding of the scalp, no facial abrasions, no spine step offs, crepitus of the chest or neck, no tenderness to palpation of the bilateral upper and lower extremities, no gross deformities, no chest tenderness, no pelvic pain.   Neurological: He is alert and oriented to person, place, and time.  Skin: Skin is warm.  Nursing note and vitals reviewed.    ED Treatments / Results  Labs (all labs ordered are listed, but only abnormal results are displayed) Labs Reviewed  COMPREHENSIVE METABOLIC PANEL - Abnormal; Notable for the following components:      Result Value   Glucose, Bld 123 (*)    BUN <5 (*)    Calcium 8.4 (*)    ALT 15 (*)    All other components within normal limits  ETHANOL - Abnormal; Notable for the following components:   Alcohol, Ethyl (B) 346 (*)    All other components within normal limits  CBC WITH DIFFERENTIAL/PLATELET    EKG EKG Interpretation  Date/Time:  Sunday September 10 2017 22:47:07 EDT Ventricular Rate:  106 PR  Interval:    QRS Duration: 100 QT Interval:  342 QTC Calculation: 455 R Axis:   47 Text Interpretation:  Sinus tachycardia Abnormal R-wave progression, early transition No acute changes Nonspecific ST and T wave abnormality Confirmed by Derwood Kaplan [phone]) on 09/10/2017 11:44:02 PM   Radiology Ct Head Wo Contrast  Result Date: 09/11/2017 CLINICAL DATA:  47 year old male with fall and trauma to the head. EXAM: CT HEAD WITHOUT CONTRAST CT CERVICAL SPINE WITHOUT CONTRAST TECHNIQUE: Multidetector CT imaging of the head and cervical spine was performed following the standard protocol without intravenous contrast. Multiplanar CT image reconstructions of the cervical spine were also generated. COMPARISON:  Head CT dated 06/27/2017 FINDINGS: CT HEAD FINDINGS Brain: There is mild age-related atrophy and chronic microvascular ischemic changes, advanced for the patient's age. There is no acute intracranial hemorrhage. No  mass effect or midline shift. No extra-axial fluid collection. Vascular: No hyperdense vessel or unexpected calcification. Skull: Normal. Negative for fracture or focal lesion. Sinuses/Orbits: There is mild diffuse mucoperiosteal thickening of paranasal sinuses. No air-fluid levels. The mastoid air cells are clear. Other: Left posterior scalp cutaneous staples. CT CERVICAL SPINE FINDINGS Alignment: No acute subluxation. Skull base and vertebrae: No acute fracture Soft tissues and spinal canal: No prevertebral fluid or swelling. No visible canal hematoma. Disc levels:  Mild degenerative changes. Upper chest: Negative. Other: Left carotid bulb calcified plaque. IMPRESSION: 1. No acute intracranial hemorrhage. 2. Mild age-related atrophy and chronic microvascular ischemic changes, advanced for the patient's age. 3. No acute/traumatic cervical spine pathology. 4. Paranasal sinus disease. Electronically Signed   By: Elgie Collard M.D.   On: 09/11/2017 00:24   Ct Cervical Spine Wo  Contrast  Result Date: 09/11/2017 CLINICAL DATA:  47 year old male with fall and trauma to the head. EXAM: CT HEAD WITHOUT CONTRAST CT CERVICAL SPINE WITHOUT CONTRAST TECHNIQUE: Multidetector CT imaging of the head and cervical spine was performed following the standard protocol without intravenous contrast. Multiplanar CT image reconstructions of the cervical spine were also generated. COMPARISON:  Head CT dated 06/27/2017 FINDINGS: CT HEAD FINDINGS Brain: There is mild age-related atrophy and chronic microvascular ischemic changes, advanced for the patient's age. There is no acute intracranial hemorrhage. No mass effect or midline shift. No extra-axial fluid collection. Vascular: No hyperdense vessel or unexpected calcification. Skull: Normal. Negative for fracture or focal lesion. Sinuses/Orbits: There is mild diffuse mucoperiosteal thickening of paranasal sinuses. No air-fluid levels. The mastoid air cells are clear. Other: Left posterior scalp cutaneous staples. CT CERVICAL SPINE FINDINGS Alignment: No acute subluxation. Skull base and vertebrae: No acute fracture Soft tissues and spinal canal: No prevertebral fluid or swelling. No visible canal hematoma. Disc levels:  Mild degenerative changes. Upper chest: Negative. Other: Left carotid bulb calcified plaque. IMPRESSION: 1. No acute intracranial hemorrhage. 2. Mild age-related atrophy and chronic microvascular ischemic changes, advanced for the patient's age. 3. No acute/traumatic cervical spine pathology. 4. Paranasal sinus disease. Electronically Signed   By: Elgie Collard M.D.   On: 09/11/2017 00:24    Procedures .Marland KitchenLaceration Repair Date/Time: 09/11/2017 1:13 AM Performed by: Derwood Kaplan, MD Authorized by: Derwood Kaplan, MD   Consent:    Consent obtained:  Verbal   Consent given by:  Patient   Risks discussed:  Infection, pain, poor wound healing and need for additional repair   Alternatives discussed:  No treatment Laceration  details:    Location:  Scalp   Length (cm):  10   Depth (mm):  2 Repair type:    Repair type:  Intermediate Pre-procedure details:    Preparation:  Patient was prepped and draped in usual sterile fashion Exploration:    Wound exploration: wound explored through full range of motion and entire depth of wound probed and visualized     Wound extent: no fascia violation noted, no foreign bodies/material noted and no underlying fracture noted     Contaminated: no   Treatment:    Area cleansed with:  Saline   Amount of cleaning:  Standard   Irrigation solution:  Sterile saline   Irrigation volume:  50   Irrigation method:  Syringe   Visualized foreign bodies/material removed: no   Skin repair:    Repair method:  Staples   Number of staples:  6 Approximation:    Approximation:  Loose Post-procedure details:    Dressing:  Bulky dressing  Patient tolerance of procedure:  Tolerated well, no immediate complications   (including critical care time)  Medications Ordered in ED Medications  bacitracin ointment (1 application Topical Given 09/11/17 0131)  Tdap (BOOSTRIX) injection 0.5 mL (0.5 mLs Intramuscular Given 09/10/17 2332)  fentaNYL (SUBLIMAZE) injection 50 mcg (50 mcg Intravenous Given 09/10/17 2330)  fentaNYL (SUBLIMAZE) injection 100 mcg (100 mcg Intravenous Given 09/11/17 0035)  lactated ringers bolus 1,000 mL (0 mLs Intravenous Stopped 09/11/17 0324)  acetaminophen (TYLENOL) tablet 650 mg (650 mg Oral Given 09/11/17 0539)     Initial Impression / Assessment and Plan / ED Course  I have reviewed the triage vital signs and the nursing notes.  Pertinent labs & imaging results that were available during my care of the patient were reviewed by me and considered in my medical decision making (see chart for details).  Clinical Course as of Sep 12 539  Mon Sep 11, 2017  0236 Results from the ER workup discussed with the patient face to face and all questions answered to the best of  my ability.  CT head reviewed.  We will keep the c-collar on.    [AN]  0236 Not clinically sober at this time.  Alcohol, Ethyl (B)(!!): 346 [AN]  0541 Patient is clinically sober. He is talking coherently, gait is normal, and is demonstrating rational thought process. We shall discharge him shortly, and we have discussed the warning signs of alcohol withdrawal with him verbally, and the information will be provided with the discharge instructions as well.  No midline c-spine tenderness, pt able to turn head to 45 degrees bilaterally without any pain and able to flex neck to the chest and extend without any pain or neurologic symptoms.  Mother to drive patient home and keep an eye on him.   [AN]    Clinical Course User Index [AN] Derwood Kaplan, MD    DDx includes: - Mechanical falls - ICH - Fractures - Contusions - Soft tissue injury  48 year old male comes in with chief complaint of fall.  Patient's fall was mechanical in nature, contributed by his intoxication.  On my exam there is no gross deformities, and patient is neurovascularly intact.  Patient does have a large scalp laceration which will need to be repaired.  Tetanus will be updated.  Final Clinical Impressions(s) / ED Diagnoses   Final diagnoses:  Scalp laceration, initial encounter  Injury of head, initial encounter  Acute alcoholic intoxication without complication [facility])    ED Discharge Orders        Ordered    bacitracin ointment  2 times daily     09/11/17 0540       Derwood Kaplan, MD 09/11/17 1610    Derwood Kaplan, MD 09/11/17 9604    Derwood Kaplan, MD 09/11/17 [phone]

## 2017-09-10 NOTE — ED Notes (Signed)
Patient transported to CT scan . 

## 2017-09-10 NOTE — ED Triage Notes (Addendum)
Patient arrived with EMS from home , lost his balance and fell inside his bedroom and hit his head against a dresser with brief LOC/intoxicated w/ETOH , pressure dressing applied by EMS at scalp laceration at occiput .

## 2017-09-11 MED ORDER — LACTATED RINGERS IV BOLUS (SEPSIS)
1000.0000 mL | Freq: Once | INTRAVENOUS | Status: AC
  Administered 2017-09-11: 1000 mL via INTRAVENOUS

## 2017-09-11 MED ORDER — ACETAMINOPHEN 325 MG PO TABS
650.0000 mg | ORAL_TABLET | Freq: Once | ORAL | Status: AC
  Administered 2017-09-11: 650 mg via ORAL
  Filled 2017-09-11: qty 2

## 2017-09-11 MED ORDER — FENTANYL CITRATE (PF) 100 MCG/2ML IJ SOLN
INTRAMUSCULAR | Status: AC
  Filled 2017-09-11: qty 2

## 2017-09-11 MED ORDER — BACITRACIN ZINC 500 UNIT/GM EX OINT
TOPICAL_OINTMENT | Freq: Two times a day (BID) | CUTANEOUS | Status: DC
  Administered 2017-09-11: 1 via TOPICAL

## 2017-09-11 MED ORDER — FENTANYL CITRATE (PF) 100 MCG/2ML IJ SOLN
100.0000 ug | Freq: Once | INTRAMUSCULAR | Status: AC
  Administered 2017-09-11: 100 ug via INTRAVENOUS

## 2017-09-11 MED ORDER — BACITRACIN ZINC 500 UNIT/GM EX OINT: 1.0000 "application " | TOPICAL_OINTMENT | Freq: Two times a day (BID) | CUTANEOUS | 0 refills | Status: DC

## 2017-09-11 NOTE — ED Notes (Addendum)
Patient stood up and refused RN to assist him back to bed , mother at bedside , paper scrubs given to pt. , assisted to sit on chair with mother .

## 2017-09-11 NOTE — Discharge Instructions (Addendum)
He was seen in the ER after you had a fall and resultant large laceration to her scalp.  We have stable liver laceration.  Please have the staples removed in 7-10 days at your primary care doctor or an urgent care.  Keep the area clean and dry, apply bacitracin ointment daily and take the medications provided. RETURN TO THE ER IF THERE IS INCREASED PAIN, REDNESS, PUS COMING OUT from the wound site.

## 2017-09-11 NOTE — ED Notes (Signed)
Dr. Rhunette CroftNanavati explained tests results and plan of care  to pt. and family.

## 2017-09-25 ENCOUNTER — Ambulatory Visit (HOSPITAL_COMMUNITY)
Admission: EM | Admit: 2017-09-25 | Discharge: 2017-09-25 | Disposition: A | Discharge status: Home / Self Care | Payer: Self-pay | Attending: Family Medicine | Admitting: Family Medicine

## 2017-09-25 ENCOUNTER — Encounter (HOSPITAL_COMMUNITY): Payer: Self-pay | Admitting: Emergency Medicine

## 2017-09-25 ENCOUNTER — Ambulatory Visit (INDEPENDENT_AMBULATORY_CARE_PROVIDER_SITE_OTHER): Payer: Self-pay

## 2017-09-25 DIAGNOSIS — Z4802 Encounter for removal of sutures: Secondary | ICD-10-CM

## 2017-09-25 DIAGNOSIS — S2241XA Multiple fractures of ribs, right side, initial encounter for closed fracture: Secondary | ICD-10-CM

## 2017-09-25 DIAGNOSIS — Z79899 Other long term (current) drug therapy: Secondary | ICD-10-CM | POA: Insufficient documentation

## 2017-09-25 DIAGNOSIS — F17210 Nicotine dependence, cigarettes, uncomplicated: Secondary | ICD-10-CM | POA: Insufficient documentation

## 2017-09-25 DIAGNOSIS — R1010 Upper abdominal pain, unspecified: Secondary | ICD-10-CM

## 2017-09-25 DIAGNOSIS — S2243XA Multiple fractures of ribs, bilateral, initial encounter for closed fracture: Secondary | ICD-10-CM | POA: Insufficient documentation

## 2017-09-25 DIAGNOSIS — R110 Nausea: Secondary | ICD-10-CM | POA: Insufficient documentation

## 2017-09-25 DIAGNOSIS — Z833 Family history of diabetes mellitus: Secondary | ICD-10-CM | POA: Insufficient documentation

## 2017-09-25 DIAGNOSIS — R0781 Pleurodynia: Secondary | ICD-10-CM | POA: Insufficient documentation

## 2017-09-25 DIAGNOSIS — Z888 Allergy status to other drugs, medicaments and biological substances status: Secondary | ICD-10-CM | POA: Insufficient documentation

## 2017-09-25 DIAGNOSIS — Z791 Long term (current) use of non-steroidal anti-inflammatories (NSAID): Secondary | ICD-10-CM | POA: Insufficient documentation

## 2017-09-25 DIAGNOSIS — K219 Gastro-esophageal reflux disease without esophagitis: Secondary | ICD-10-CM | POA: Insufficient documentation

## 2017-09-25 DIAGNOSIS — Z880 Allergy status to penicillin: Secondary | ICD-10-CM | POA: Insufficient documentation

## 2017-09-25 DIAGNOSIS — F419 Anxiety disorder, unspecified: Secondary | ICD-10-CM | POA: Insufficient documentation

## 2017-09-25 DIAGNOSIS — Z8249 Family history of ischemic heart disease and other diseases of the circulatory system: Secondary | ICD-10-CM | POA: Insufficient documentation

## 2017-09-25 DIAGNOSIS — F319 Bipolar disorder, unspecified: Secondary | ICD-10-CM | POA: Insufficient documentation

## 2017-09-25 DIAGNOSIS — X58XXXA Exposure to other specified factors, initial encounter: Secondary | ICD-10-CM | POA: Insufficient documentation

## 2017-09-25 LAB — POCT I-STAT, CHEM 8
BUN: 5 mg/dL — ABNORMAL LOW (ref 6–20)
Calcium, Ion: 1.24 mmol/L (ref 1.15–1.40)
Chloride: 103 mmol/L (ref 101–111)
Creatinine, Ser: 0.6 mg/dL — ABNORMAL LOW (ref 0.61–1.24)
Glucose, Bld: 99 mg/dL (ref 65–99)
HCT: 47 % (ref 39.0–52.0)
Hemoglobin: 16 g/dL (ref 13.0–17.0)
Potassium: 4.3 mmol/L (ref 3.5–5.1)
Sodium: 139 mmol/L (ref 135–145)
TCO2: 25 mmol/L (ref 22–32)

## 2017-09-25 LAB — COMPREHENSIVE METABOLIC PANEL
ALT: 62 U/L (ref 17–63)
AST: 48 U/L — ABNORMAL HIGH (ref 15–41)
Albumin: 4.2 g/dL (ref 3.5–5.0)
Alkaline Phosphatase: 92 U/L (ref 38–126)
Anion gap: 9 (ref 5–15)
BUN: <5 mg/dL — ABNORMAL LOW (ref 6–20)
CO2: 23 mmol/L (ref 22–32)
Calcium: 9.3 mg/dL (ref 8.9–10.3)
Chloride: 104 mmol/L (ref 101–111)
Creatinine, Ser: 0.66 mg/dL (ref 0.61–1.24)
GFR calc Af Amer: >60 mL/min (ref >60)
GFR calc non Af Amer: >60 mL/min (ref >60)
Glucose, Bld: 98 mg/dL (ref 65–99)
Potassium: 3.9 mmol/L (ref 3.5–5.1)
Sodium: 136 mmol/L (ref 135–145)
Total Bilirubin: 0.8 mg/dL (ref 0.3–1.2)
Total Protein: 7.6 g/dL (ref 6.5–8.1)

## 2017-09-25 LAB — CBC
HCT: 44.4 % (ref 39.0–52.0)
Hemoglobin: 14.6 g/dL (ref 13.0–17.0)
MCH: 32.4 pg (ref 26.0–34.0)
MCHC: 32.9 g/dL (ref 30.0–36.0)
MCV: 98.4 fL (ref 78.0–100.0)
Platelets: 207 10*3/uL (ref 150–400)
RBC: 4.51 MIL/uL (ref 4.22–5.81)
RDW: 14.3 % (ref 11.5–15.5)
WBC: 6.6 10*3/uL (ref 4.0–10.5)

## 2017-09-25 LAB — LIPASE, BLOOD: Lipase: 48 U/L (ref 11–51)

## 2017-09-25 MED ORDER — OMEPRAZOLE 20 MG PO CPDR: 20.0000 mg | DELAYED_RELEASE_CAPSULE | Freq: Every day | ORAL | 0 refills | Status: DC

## 2017-09-25 MED ORDER — IBUPROFEN 400 MG PO TABS: 400.0000 mg | ORAL_TABLET | Freq: Four times a day (QID) | ORAL | 0 refills | Status: DC | PRN

## 2017-09-25 NOTE — Discharge Instructions (Signed)
Ibuprofen every 6 hours for pain control. Use of pillow brace chest to encourage coughing and deep breathing.  I will call you if there are any concerning lab values today. Please establish with a primary care provider for recheck of symptoms in the next 1-2 weeks.  If develop worsening of pain, nausea/vomiting, shortness of breath please return or go to Er.

## 2017-09-25 NOTE — ED Provider Notes (Signed)
[facility]    CSN: [id number] Arrival date & time: 09/25/17  1321     History   Chief Complaint Chief Complaint  Patient presents with  . Suture / Staple Removal  . rib pain    HPI Dustin DraftFloyd T Keshishyan Jr. is a 47 y.o. male.   Dustin Olsen presents with complaints of right rib pain and upper abdominal pain. He states this pain has been worsening since January. He was found to have posterior rib fractures after a fall 1/9. He states the pain is to the anterior right chest, it is worse with coughing as well as with laying flat. Denies shortness of breath. Normal urination. States has noted some mild pain after eating and occasional nausea. Without vomiting or diarrhea. No other known injury although did fall 3/24 and states does not known if he struck his chest/ribs/abdomen with that fall as he was intoxicated.   Due to have scalp staples removed as well.   Hx of anxiety, bipolar, depression, gerd, pna. States hasn't drank any alcohol in 1.5 weeks.   ROS per HPI.      Past Medical History:  Diagnosis Date  . Anxiety   . Bipolar disorder (HCC)   . Depression   . Dyspnea   . GERD (gastroesophageal reflux disease)     Patient Active Problem List   Diagnosis Date Noted  . Depression 05/07/2017  . Bipolar disorder (HCC) 05/07/2017  . Community acquired pneumonia 05/07/2017  . Pneumonia 05/07/2017  . Chest pain 05/13/2012  . Tobacco abuse 05/10/2012  . Alcohol abuse 05/10/2012  . Transaminitis 05/10/2012  . GERD (gastroesophageal reflux disease) 05/10/2012    Past Surgical History:  Procedure Laterality Date  . FOOT SURGERY    . NO PAST SURGERIES         Home Medications    Prior to Admission medications   Medication Sig Start Date End Date Taking? Authorizing Provider  bacitracin ointment Apply 1 application topically 2 (two) times daily. 09/11/17   Derwood KaplanNanavati, Ankit, MD  ibuprofen (ADVIL,MOTRIN) 400 MG tablet Take 1 tablet (400 mg total) by mouth every 6 (six)  hours as needed for moderate pain. 09/25/17   Georgetta HaberBurky, Natalie B, NP    Family History Family History  Problem Relation Age of Onset  . Diabetes Mother   . Hyperlipidemia Father   . Hypertension Father   . Coronary artery disease Father        Stents placed  . Heart disease Maternal Grandmother   . Heart disease Paternal Grandmother     Social History Social History   Tobacco Use  . Smoking status: Current Every Day Smoker    Packs/day: 0.00  . Smokeless tobacco: Never Used  Substance Use Topics  . Alcohol use: Yes    Alcohol/week: 3.6 oz    Types: 6 Cans of beer per week    Comment: 6 pack per day  . Drug use: No     Allergies   Penicillins and Tramadol hcl   Review of Systems Review of Systems   Physical Exam Triage Vital Signs ED Triage Vitals [09/25/17 1351]  Enc Vitals Group     BP (!) 149/91     Pulse Rate 98     Resp 18     Temp 98.4 F (36.9 C)     Temp Source Oral     SpO2 100 %     Weight      Height      Head Circumference  Peak Flow      Pain Score      Pain Loc      Pain Edu?      Excl. in GC?    No data found.  Updated Vital Signs BP (!) 149/91 (BP Location: Left Arm)   Pulse 98   Temp 98.4 F (36.9 C) (Oral)   Resp 18   SpO2 100%   Visual Acuity Right Eye Distance:   Left Eye Distance:   Bilateral Distance:    Right Eye Near:   Left Eye Near:    Bilateral Near:     Physical Exam  Constitutional: He is oriented to person, place, and time. He appears well-developed and well-nourished.  HENT:  Head:    Staples to posterior scalp- well healed incision  Cardiovascular: Normal rate and regular rhythm.  Pulmonary/Chest: Effort normal and breath sounds normal.     He exhibits tenderness.  Point tenderness of lateral ribs as well as distal anterior ribs; lungs clear to ausculation.     Abdominal: Soft. He exhibits distension. There is tenderness in the right upper quadrant. There is no rigidity, no rebound, no  guarding, no CVA tenderness, no tenderness at McBurney's point and negative Murphy's sign.    Small area of point tenderness just distal to anterior ribs  Neurological: He is alert and oriented to person, place, and time.  Skin: Skin is warm and dry.     UC Treatments / Results  Labs (all labs ordered are listed, but only abnormal results are displayed) Labs Reviewed  POCT I-STAT, CHEM 8 - Abnormal; Notable for the following components:      Result Value   BUN 5 (*)    Creatinine, Ser 0.60 (*)    All other components within normal limits  CBC  COMPREHENSIVE METABOLIC PANEL  LIPASE, BLOOD    EKG None Radiology Dg Ribs Unilateral W/chest Right  Result Date: 09/25/2017 CLINICAL DATA:  Right mid ribcage pain since trauma in January 2019. EXAM: RIGHT RIBS AND CHEST - 3+ VIEW COMPARISON:  Chest x-ray and chest CT scan of June 27, 2017 FINDINGS: There are fractures of the posterolateral aspects of the third, fifth, sixth, seventh, and eighth ribs which have not healed. There is some surrounding periosteal reaction. There is no pneumothorax or pleural effusion. There are old healed fractures of the left posterior sixth and seventh ribs. The cardiac silhouette is top-normal in size. The pulmonary vascularity is normal. There is no pleural effusion. IMPRESSION: Unhealed fractures of the third, fifth, sixth, seventh, and eighth right ribs posteriorly. There are old healed fractures on the left. No pneumothorax or pleural effusion. Electronically Signed   By: David  [place] M.D.   On: 09/25/2017 15:08    Procedures Procedures (including critical care time)  Medications Ordered in UC Medications - No data to display   Initial Impression / Assessment and Plan / UC Course  I have reviewed the triage vital signs and the nursing notes.  Pertinent labs & imaging results that were available during my care of the patient were reviewed by me and considered in my medical decision making (see chart  for details).     Non toxic in appearance. Afebrile, without tachypnea, tachycardia. States has had this pain for months now. Pain palpable and reproducible with movement. Without shortness of breath. Hx of falls as well as rib fractures in the past. On review of patient d/c summary liver laceration is mentioned, although no imaging of this was found. Chem  8 reassuring, h&h stable. Baseline labs and lipase drawn today. Will call patient with any concerning findings. Rib xray with findings of multiple rib fractures. Without COPD or asthma. Would assume these are possibly new as original fractures were 06/2017. Discussed bracing of chest, cough and deep breathing, ibuprofen for pain control. Staples removed. Encouraged establish with pcp for recheck. Return precautions provided.Patient verbalized understanding and agreeable to plan.    Final Clinical Impressions(s) / UC Diagnoses   Final diagnoses:  Closed fracture of multiple ribs of right side, initial encounter    ED Discharge Orders        Ordered    ibuprofen (ADVIL,MOTRIN) 400 MG tablet  Every 6 hours PRN,   Status:  Discontinued     09/25/17 1515    ibuprofen (ADVIL,MOTRIN) 400 MG tablet  Every 6 hours PRN     09/25/17 1521       Controlled Substance Prescriptions [place] Controlled Substance Registry consulted? Not Applicable   Georgetta Haber, NP 09/25/17 1524

## 2017-09-25 NOTE — ED Triage Notes (Signed)
Pt sts need staples removed and having right rib pain

## 2018-01-23 IMAGING — CT CT MAXILLOFACIAL W/O CM
3 series · 16 of 47 positions shown, 19 images · non-contrast
Comparison: None.

CLINICAL DATA: Facial trauma

EXAM:
CT MAXILLOFACIAL WITHOUT CONTRAST
TECHNIQUE: Multidetector CT imaging of the maxillofacial structures was
performed. Multiplanar CT image reconstructions were also generated.

[Series 3: facialbone 2.0 st · axial · 0.34mm/px · z∈[-174,-30]mm · 10 of 84 slices shown, 13 images]
[im 6/84  brain]
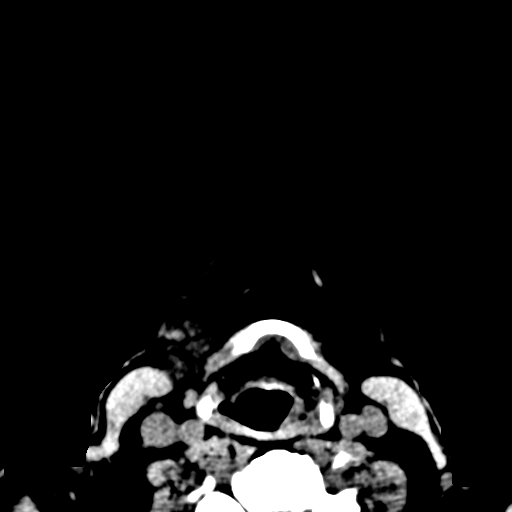
[im 6/84  bone]
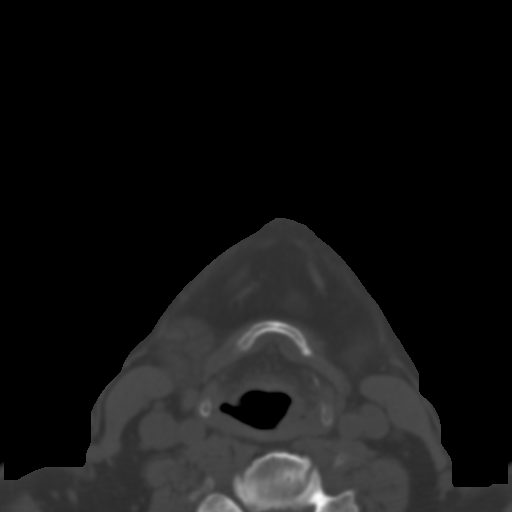
[im 15/84  bone]
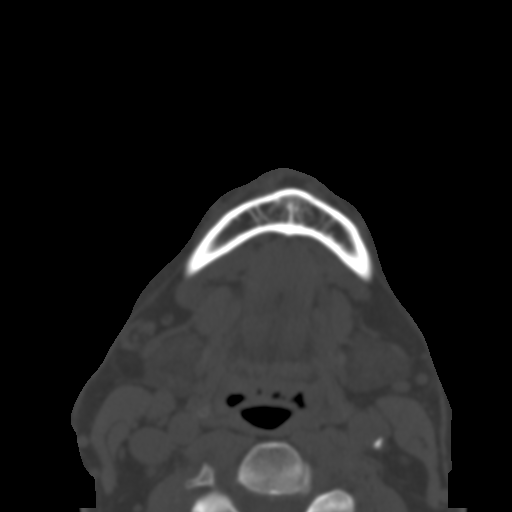
[im 23/84  bone]
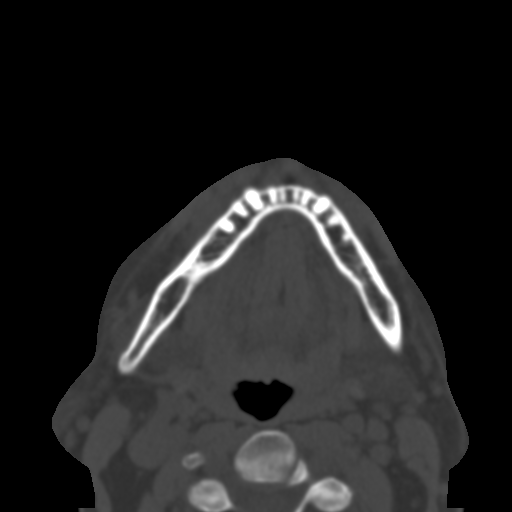
[im 29/84  bone]
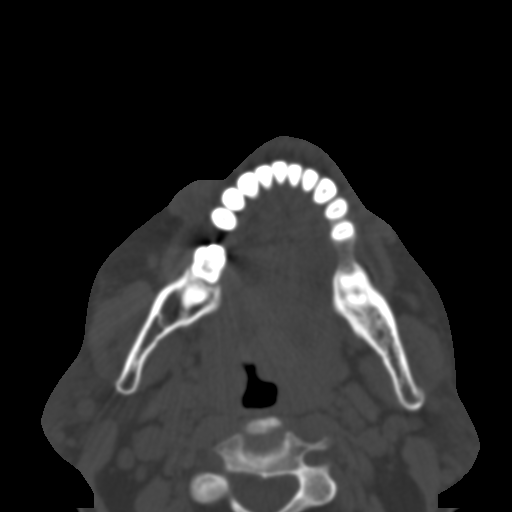
[im 38/84  brain]
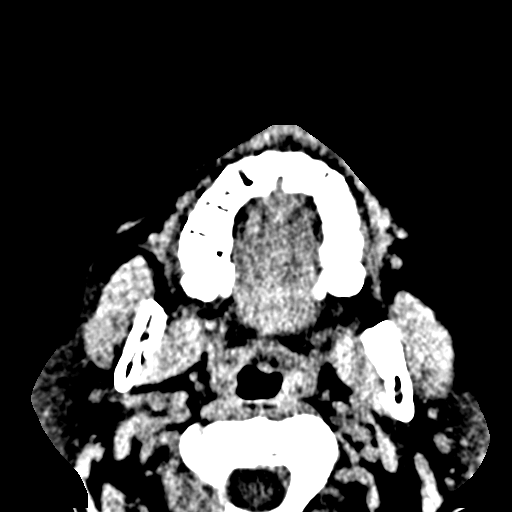
[im 38/84  bone]
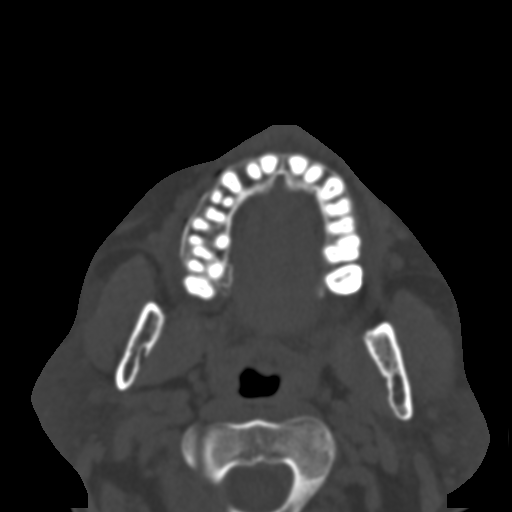
[im 46/84  bone]
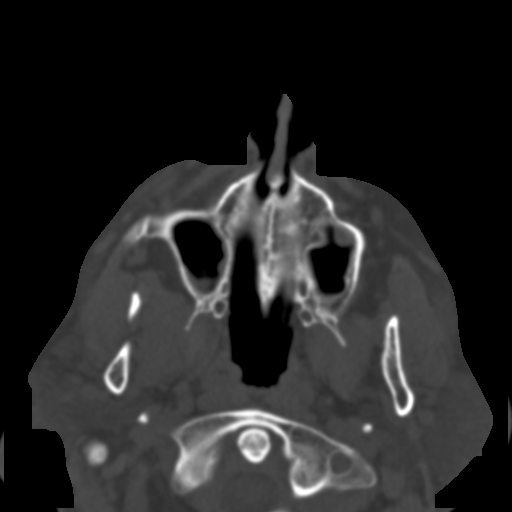
[im 55/84  bone]
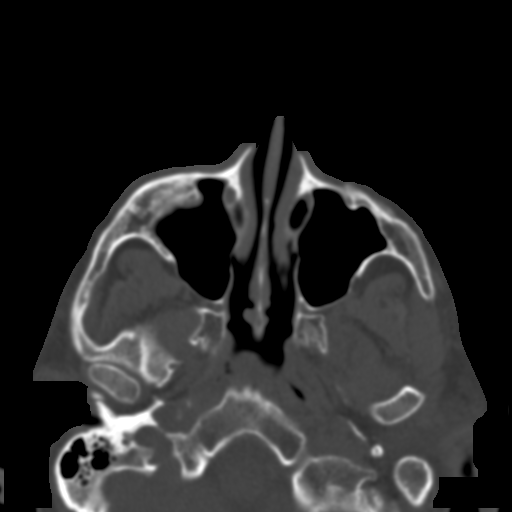
[im 63/84  bone]
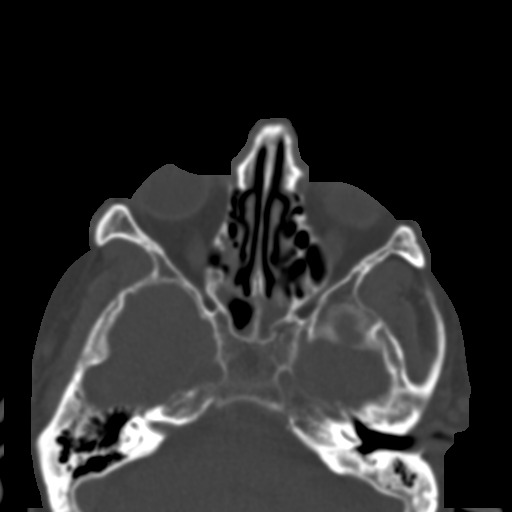
[im 69/84  brain]
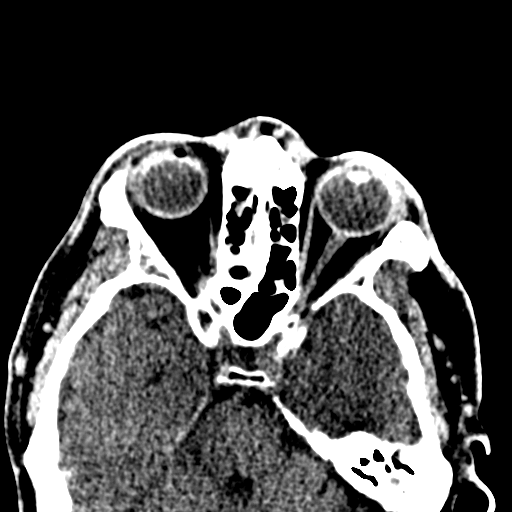
[im 69/84  bone]
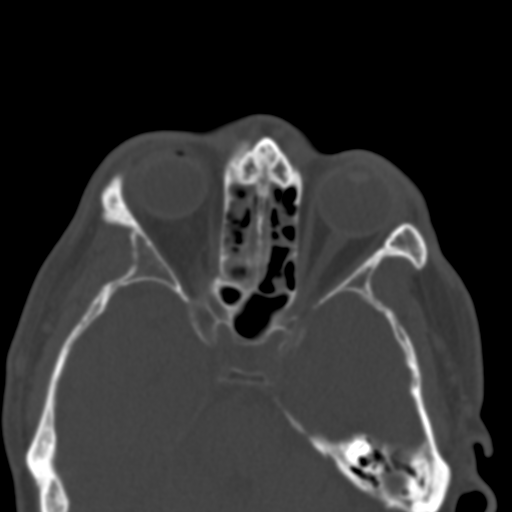
[im 78/84  bone]
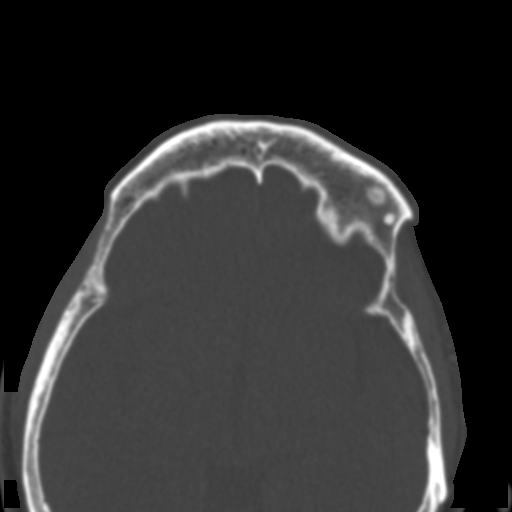

[Series 7: facialbone 2.0 cor st · coronal · 0.34mm/px · 3 of 79 slices shown]
[im 27/79  bone]
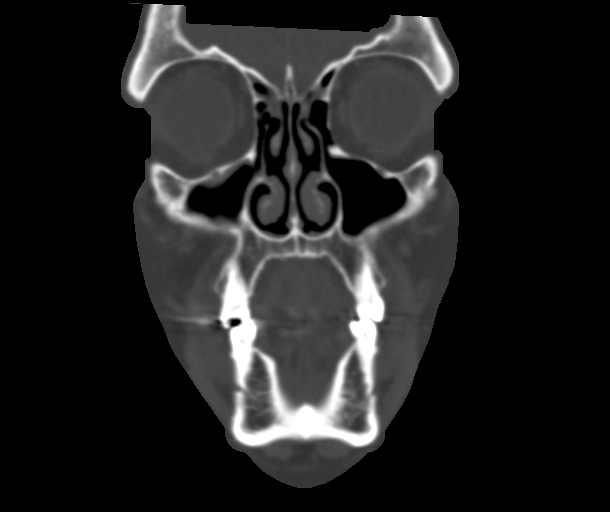
[im 35/79  bone]
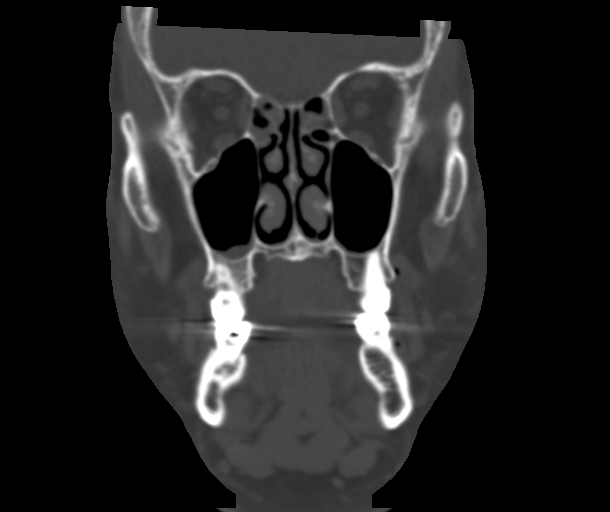
[im 44/79  bone]
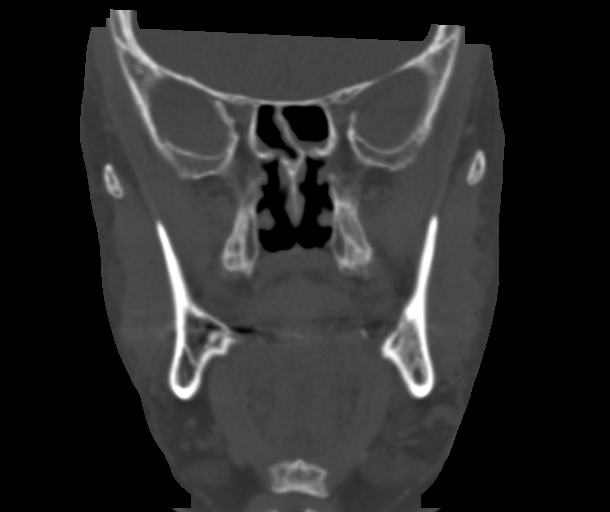

[Series 8: facialbone 2.0 sag st · sagittal · 0.32mm/px · 3 of 94 slices shown]
[im 32/94  bone]
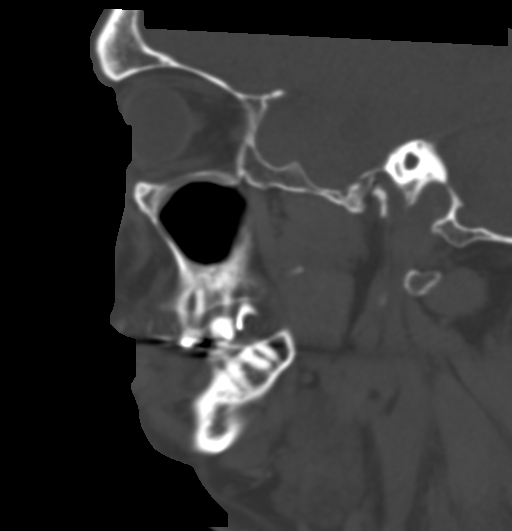
[im 47/94  bone]
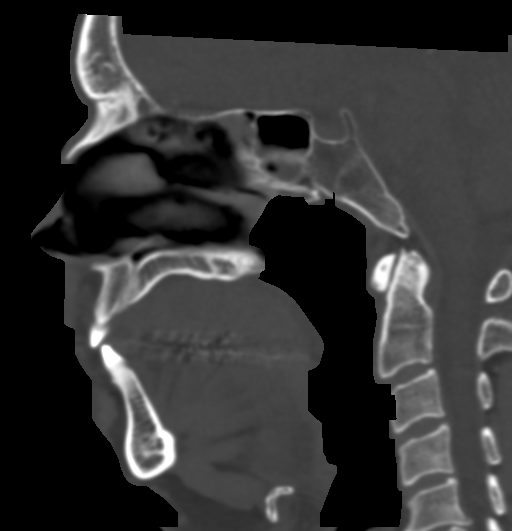
[im 63/94  bone]
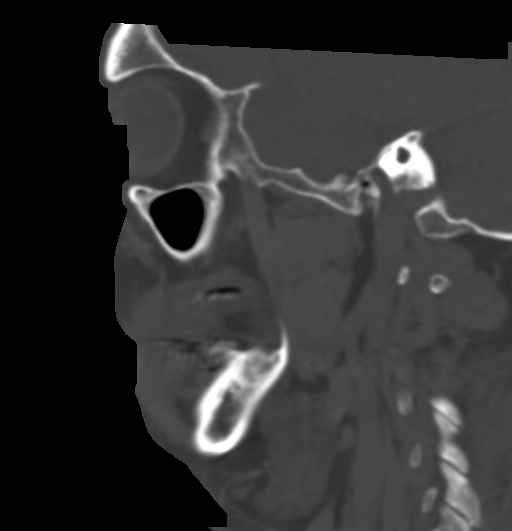

[16 of 47 positions shown; findings below may reference images not displayed]

FINDINGS: Osseous: Negative for facial fracture.  No bony lesion.

Orbits: Negative

Sinuses: Mild mucosal edema paranasal sinuses without air-fluid
level

Soft tissues: Mild soft tissue swelling over the right eye. No soft
tissue mass.

Limited intracranial: Negative
IMPRESSION: Negative for facial fracture. Soft tissue swelling right periorbital
region.

## 2018-01-23 IMAGING — CT CT HEAD W/O CM
4 series · 17 of 47 positions shown, 19 images · non-contrast
Comparison: None.

CLINICAL DATA: Minor head trauma

EXAM:
CT HEAD WITHOUT CONTRAST
TECHNIQUE: Contiguous axial images were obtained from the base of the skull
through the vertex without intravenous contrast.

[Series 3: head without · axial · non-contrast · 0.49mm/px · z∈[-43,+77]mm · 7 of 33 slices shown, 9 images]
[im 5/33  brain]
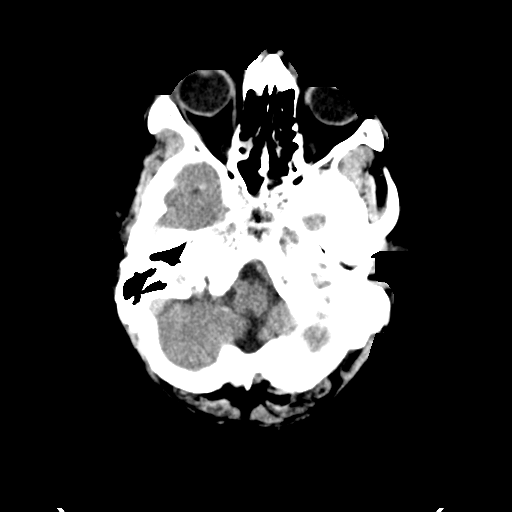
[im 5/33  bone]
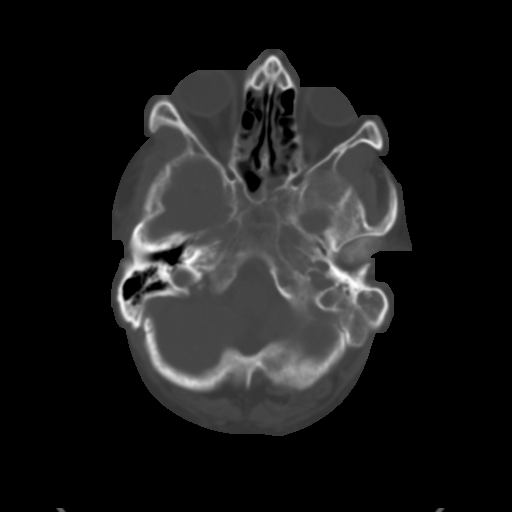
[im 9/33  brain]
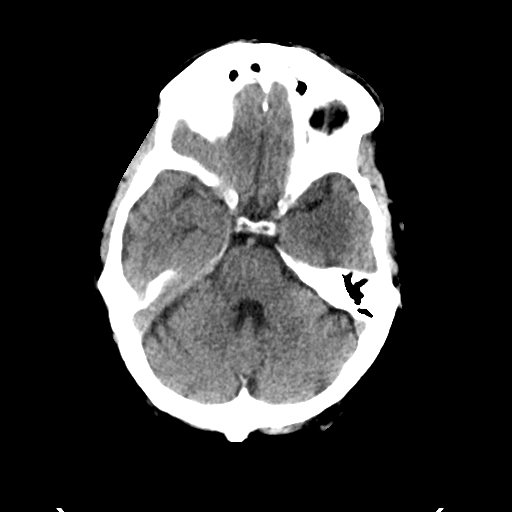
[im 13/33  brain]
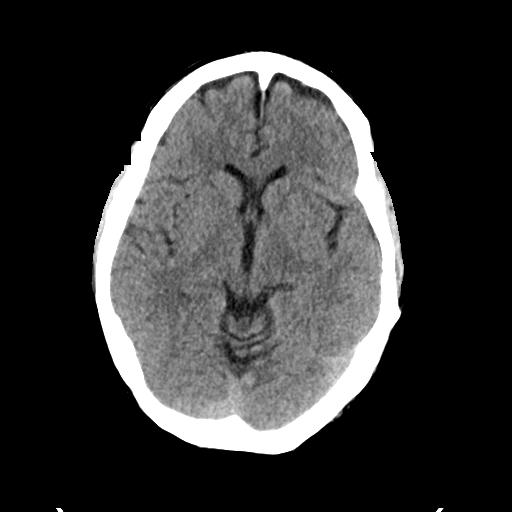
[im 17/33  brain]
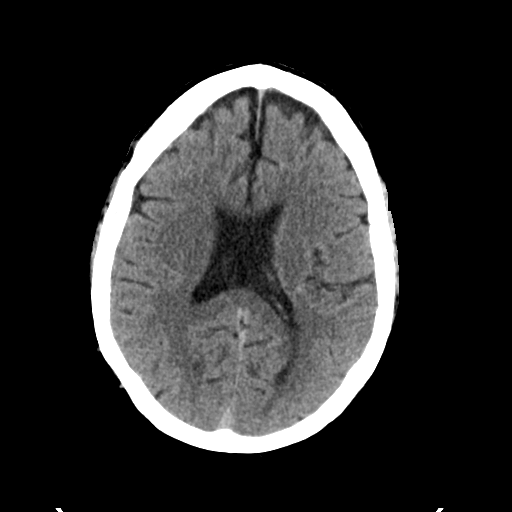
[im 21/33  brain]
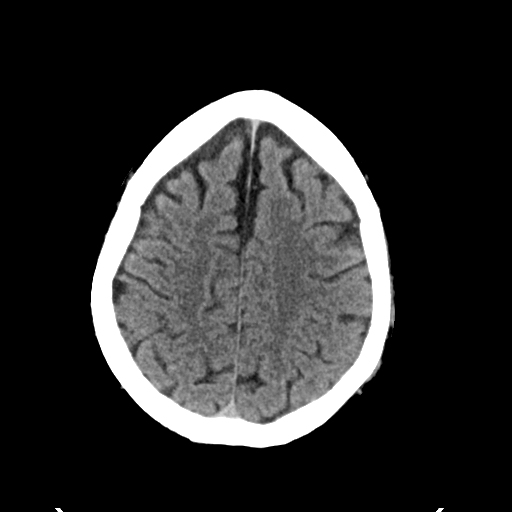
[im 21/33  bone]
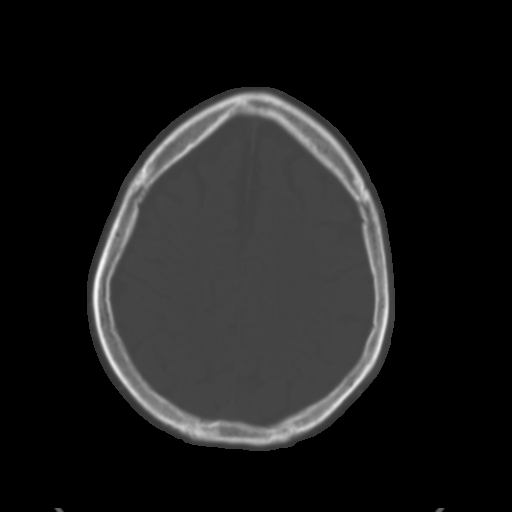
[im 25/33  brain]
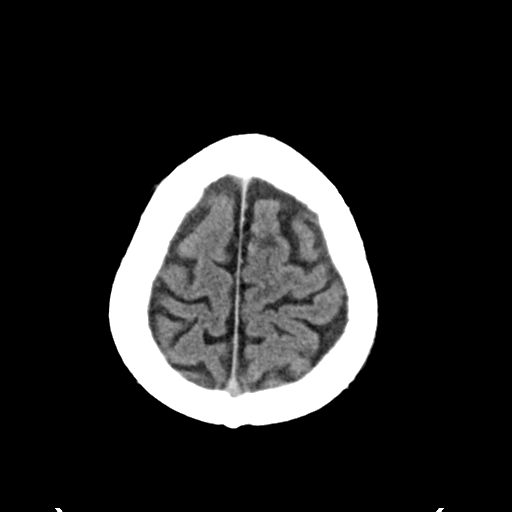
[im 29/33  brain]
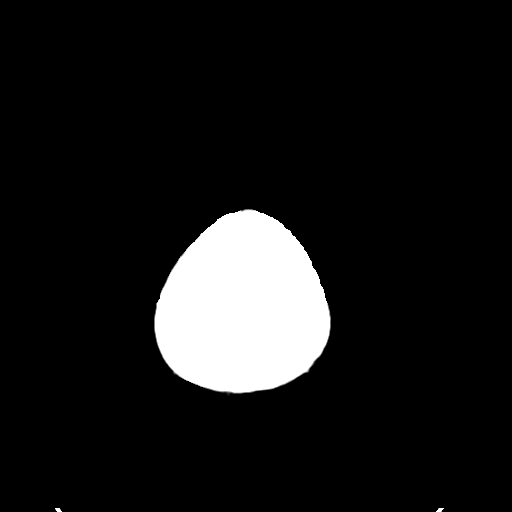

[Series 4: head bone · axial · 0.49mm/px · z∈[-47,+9]mm · 4 of 81 slices shown]
[im 9/81  bone]
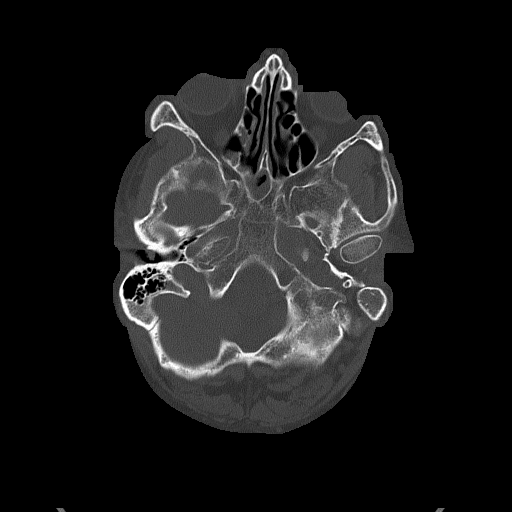
[im 17/81  bone]
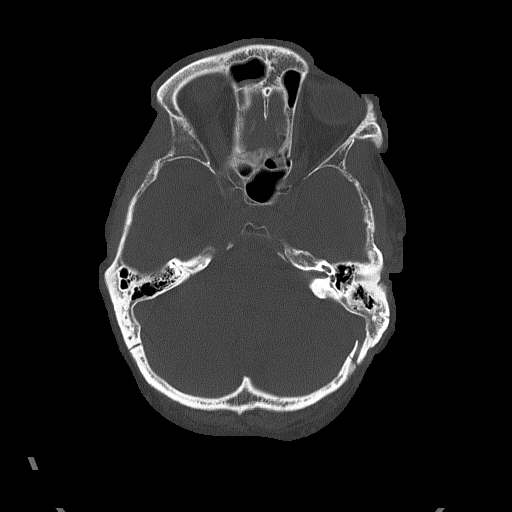
[im 25/81  bone]
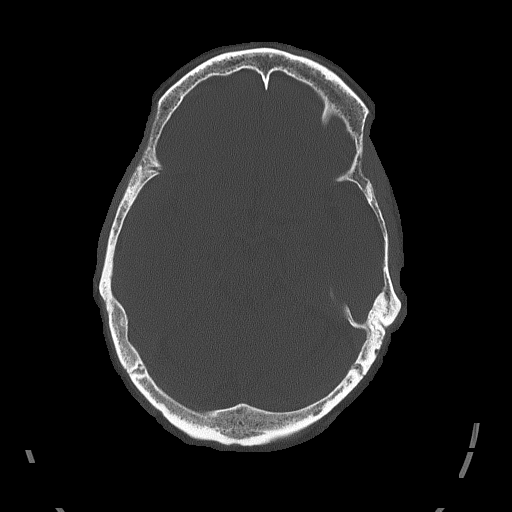
[im 37/81  bone]
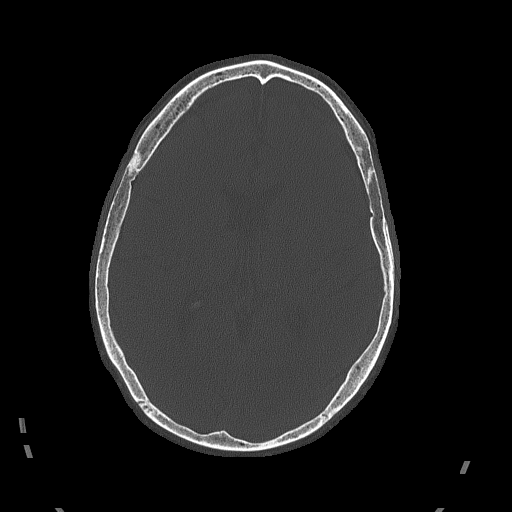

[Series 5: head without cor · coronal · non-contrast · 0.34mm/px · 3 of 75 slices shown]
[im 25/75  brain]
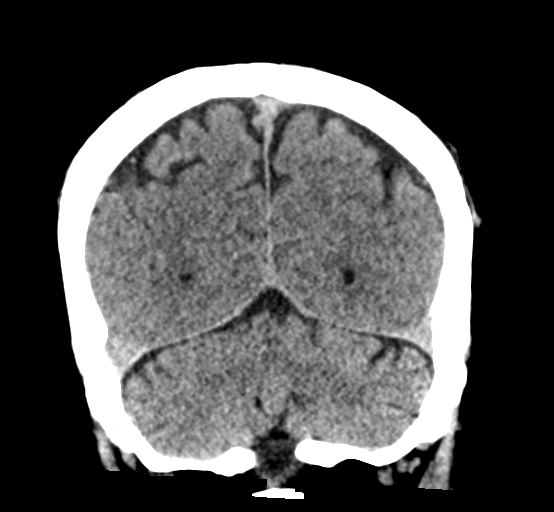
[im 33/75  brain]
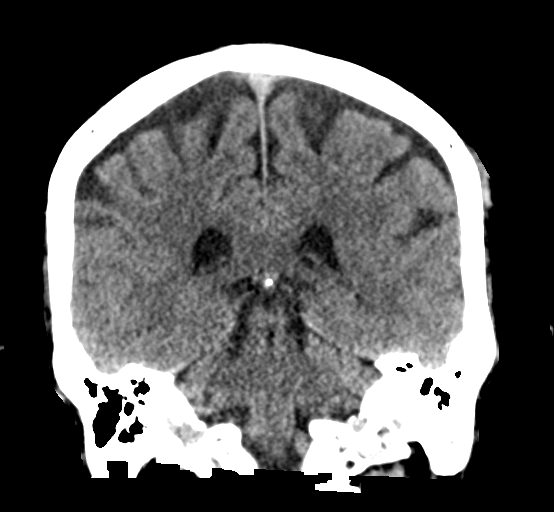
[im 42/75  brain]
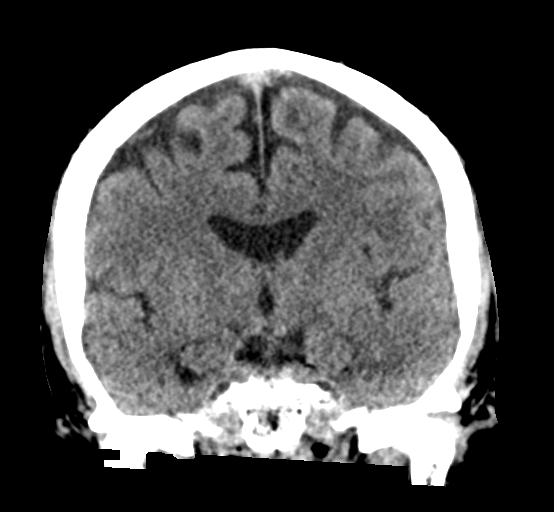

[Series 6: head without sag · sagittal · non-contrast · 0.32mm/px · 3 of 61 slices shown]
[im 21/61  brain]
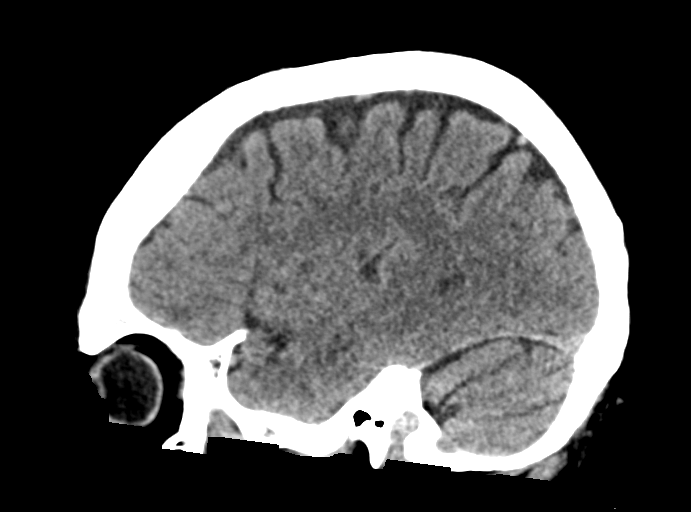
[im 31/61  brain]
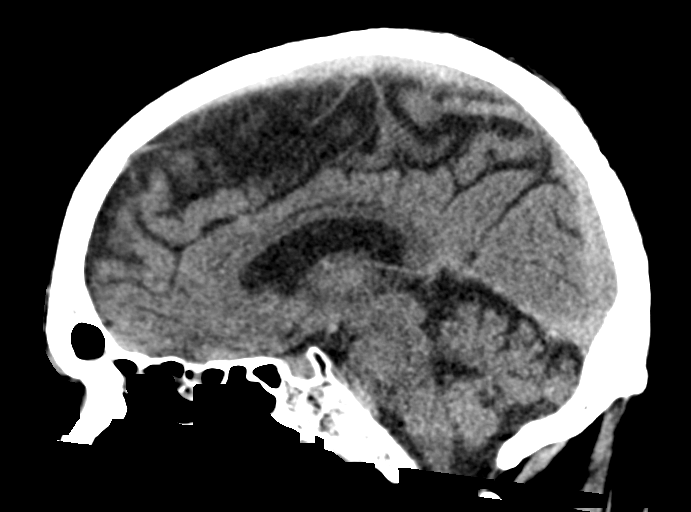
[im 41/61  brain]
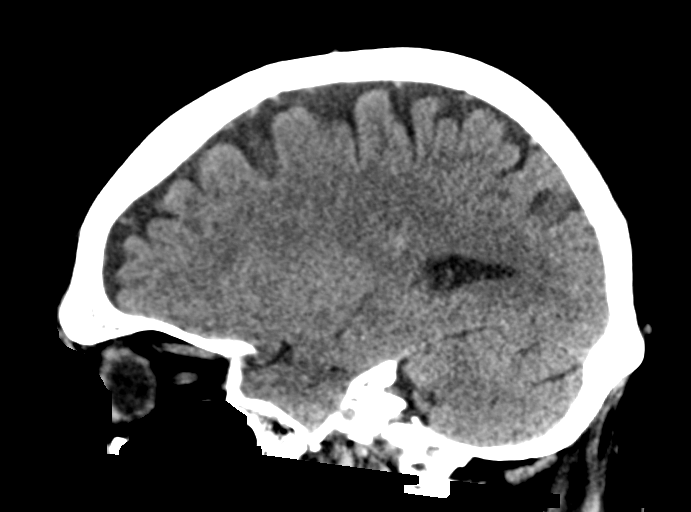

[17 of 47 positions shown; findings below may reference images not displayed]

FINDINGS: Brain: No evidence of acute infarction, hemorrhage, hydrocephalus,
extra-axial collection or mass lesion/mass effect.

Vascular: Negative for hyperdense vessel

Skull: Negative

Sinuses/Orbits: Mild mucosal edema paranasal sinuses.  Normal orbit

Other: None
IMPRESSION: Negative CT of the brain

Mucosal edema paranasal sinuses.

## 2018-03-10 ENCOUNTER — Other Ambulatory Visit: Payer: Self-pay

## 2018-03-10 ENCOUNTER — Encounter (HOSPITAL_COMMUNITY): Payer: Self-pay | Admitting: Emergency Medicine

## 2018-03-10 ENCOUNTER — Encounter (HOSPITAL_COMMUNITY)
Admission: EM | Disposition: A | Discharge status: Home / Self Care | Payer: Self-pay | Source: Home / Self Care | Attending: Cardiovascular Disease

## 2018-03-10 ENCOUNTER — Inpatient Hospital Stay (HOSPITAL_COMMUNITY): Payer: Self-pay

## 2018-03-10 ENCOUNTER — Emergency Department (HOSPITAL_COMMUNITY): Payer: Self-pay

## 2018-03-10 ENCOUNTER — Inpatient Hospital Stay (HOSPITAL_COMMUNITY)
Admission: EM | Admit: 2018-03-10 | Discharge: 2018-03-11 | DRG: 247 | Disposition: A | Discharge status: Home / Self Care | Payer: Self-pay | Attending: Cardiovascular Disease | Admitting: Cardiovascular Disease

## 2018-03-10 DIAGNOSIS — Z833 Family history of diabetes mellitus: Secondary | ICD-10-CM

## 2018-03-10 DIAGNOSIS — Z885 Allergy status to narcotic agent status: Secondary | ICD-10-CM

## 2018-03-10 DIAGNOSIS — Z716 Tobacco abuse counseling: Secondary | ICD-10-CM

## 2018-03-10 DIAGNOSIS — F1010 Alcohol abuse, uncomplicated: Secondary | ICD-10-CM | POA: Diagnosis present

## 2018-03-10 DIAGNOSIS — R600 Localized edema: Secondary | ICD-10-CM | POA: Diagnosis present

## 2018-03-10 DIAGNOSIS — I2111 ST elevation (STEMI) myocardial infarction involving right coronary artery: Secondary | ICD-10-CM

## 2018-03-10 DIAGNOSIS — K219 Gastro-esophageal reflux disease without esophagitis: Secondary | ICD-10-CM | POA: Diagnosis present

## 2018-03-10 DIAGNOSIS — I213 ST elevation (STEMI) myocardial infarction of unspecified site: Secondary | ICD-10-CM | POA: Diagnosis present

## 2018-03-10 DIAGNOSIS — Z955 Presence of coronary angioplasty implant and graft: Secondary | ICD-10-CM

## 2018-03-10 DIAGNOSIS — F17210 Nicotine dependence, cigarettes, uncomplicated: Secondary | ICD-10-CM | POA: Diagnosis present

## 2018-03-10 DIAGNOSIS — I252 Old myocardial infarction: Secondary | ICD-10-CM | POA: Diagnosis present

## 2018-03-10 DIAGNOSIS — F419 Anxiety disorder, unspecified: Secondary | ICD-10-CM | POA: Diagnosis present

## 2018-03-10 DIAGNOSIS — Z880 Allergy status to penicillin: Secondary | ICD-10-CM

## 2018-03-10 DIAGNOSIS — Z79899 Other long term (current) drug therapy: Secondary | ICD-10-CM

## 2018-03-10 DIAGNOSIS — Z8349 Family history of other endocrine, nutritional and metabolic diseases: Secondary | ICD-10-CM

## 2018-03-10 DIAGNOSIS — F32A Depression, unspecified: Secondary | ICD-10-CM | POA: Diagnosis present

## 2018-03-10 DIAGNOSIS — Z8249 Family history of ischemic heart disease and other diseases of the circulatory system: Secondary | ICD-10-CM

## 2018-03-10 DIAGNOSIS — F329 Major depressive disorder, single episode, unspecified: Secondary | ICD-10-CM | POA: Diagnosis present

## 2018-03-10 DIAGNOSIS — I2121 ST elevation (STEMI) myocardial infarction involving left circumflex coronary artery: Principal | ICD-10-CM | POA: Diagnosis present

## 2018-03-10 DIAGNOSIS — Z720 Tobacco use: Secondary | ICD-10-CM | POA: Diagnosis present

## 2018-03-10 DIAGNOSIS — I2510 Atherosclerotic heart disease of native coronary artery without angina pectoris: Secondary | ICD-10-CM

## 2018-03-10 DIAGNOSIS — F319 Bipolar disorder, unspecified: Secondary | ICD-10-CM | POA: Diagnosis present

## 2018-03-10 DIAGNOSIS — F1290 Cannabis use, unspecified, uncomplicated: Secondary | ICD-10-CM | POA: Diagnosis present

## 2018-03-10 HISTORY — PX: CORONARY/GRAFT ACUTE MI REVASCULARIZATION: CATH118305

## 2018-03-10 HISTORY — DX: Atherosclerotic heart disease of native coronary artery without angina pectoris: I25.10

## 2018-03-10 HISTORY — PX: LEFT HEART CATH AND CORONARY ANGIOGRAPHY: CATH118249

## 2018-03-10 LAB — CBC WITH DIFFERENTIAL/PLATELET
Abs Immature Granulocytes: 0.1 10*3/uL (ref 0.0–0.1)
Basophils Absolute: 0 10*3/uL (ref 0.0–0.1)
Basophils Relative: 1 %
Eosinophils Absolute: 0.2 10*3/uL (ref 0.0–0.7)
Eosinophils Relative: 3 %
HCT: 46.7 % (ref 39.0–52.0)
Hemoglobin: 15.4 g/dL (ref 13.0–17.0)
Immature Granulocytes: 1 %
Lymphocytes Relative: 20 %
Lymphs Abs: 1.7 10*3/uL (ref 0.7–4.0)
MCH: 32.6 pg (ref 26.0–34.0)
MCHC: 33 g/dL (ref 30.0–36.0)
MCV: 98.9 fL (ref 78.0–100.0)
Monocytes Absolute: 0.7 10*3/uL (ref 0.1–1.0)
Monocytes Relative: 8 %
Neutro Abs: 5.8 10*3/uL (ref 1.7–7.7)
Neutrophils Relative %: 67 %
Platelets: 166 10*3/uL (ref 150–400)
RBC: 4.72 MIL/uL (ref 4.22–5.81)
RDW: 13.2 % (ref 11.5–15.5)
WBC: 8.6 10*3/uL (ref 4.0–10.5)

## 2018-03-10 LAB — ECHOCARDIOGRAM COMPLETE
Height: 71 in
Weight: 3200 oz

## 2018-03-10 LAB — BASIC METABOLIC PANEL
Anion gap: 9 (ref 5–15)
BUN: 11 mg/dL (ref 6–20)
CO2: 19 mmol/L — ABNORMAL LOW (ref 22–32)
Calcium: 8.4 mg/dL — ABNORMAL LOW (ref 8.9–10.3)
Chloride: 108 mmol/L (ref 98–111)
Creatinine, Ser: 0.66 mg/dL (ref 0.61–1.24)
GFR calc Af Amer: >60 mL/min (ref >60)
GFR calc non Af Amer: >60 mL/min (ref >60)
Glucose, Bld: 112 mg/dL — ABNORMAL HIGH (ref 70–99)
Potassium: 3.7 mmol/L (ref 3.5–5.1)
Sodium: 136 mmol/L (ref 135–145)

## 2018-03-10 LAB — I-STAT CHEM 8, ED
BUN: 15 mg/dL (ref 6–20)
Calcium, Ion: 0.99 mmol/L — ABNORMAL LOW (ref 1.15–1.40)
Chloride: 103 mmol/L (ref 98–111)
Creatinine, Ser: 0.7 mg/dL (ref 0.61–1.24)
Glucose, Bld: 139 mg/dL — ABNORMAL HIGH (ref 70–99)
HCT: 47 % (ref 39.0–52.0)
Hemoglobin: 16 g/dL (ref 13.0–17.0)
Potassium: 3.3 mmol/L — ABNORMAL LOW (ref 3.5–5.1)
Sodium: 136 mmol/L (ref 135–145)
TCO2: 21 mmol/L — ABNORMAL LOW (ref 22–32)

## 2018-03-10 LAB — LIPID PANEL
Cholesterol: 149 mg/dL (ref 0–200)
HDL: 49 mg/dL (ref >40)
LDL Cholesterol: 83 mg/dL (ref 0–99)
Total CHOL/HDL Ratio: 3 RATIO
Triglycerides: 84 mg/dL (ref <150)
VLDL: 17 mg/dL (ref 0–40)

## 2018-03-10 LAB — CBC
HCT: 43.6 % (ref 39.0–52.0)
Hemoglobin: 14.5 g/dL (ref 13.0–17.0)
MCH: 32.2 pg (ref 26.0–34.0)
MCHC: 33.3 g/dL (ref 30.0–36.0)
MCV: 96.9 fL (ref 78.0–100.0)
Platelets: 168 10*3/uL (ref 150–400)
RBC: 4.5 MIL/uL (ref 4.22–5.81)
RDW: 13.2 % (ref 11.5–15.5)
WBC: 10.2 10*3/uL (ref 4.0–10.5)

## 2018-03-10 LAB — PROTIME-INR
INR: 1.07
Prothrombin Time: 13.8 seconds (ref 11.4–15.2)

## 2018-03-10 LAB — I-STAT TROPONIN, ED: Troponin i, poc: 0 ng/mL (ref 0.00–0.08)

## 2018-03-10 LAB — MAGNESIUM: Magnesium: 1.7 mg/dL (ref 1.7–2.4)

## 2018-03-10 LAB — HEMOGLOBIN A1C
Hgb A1c MFr Bld: 5.4 % (ref 4.8–5.6)
Mean Plasma Glucose: 108.28 mg/dL

## 2018-03-10 LAB — TROPONIN I
Troponin I: 9.92 ng/mL (ref <0.03)
Troponin I: 13.28 ng/mL (ref <0.03)
Troponin I: 9.44 ng/mL (ref <0.03)

## 2018-03-10 LAB — PHOSPHORUS: Phosphorus: 3.7 mg/dL (ref 2.5–4.6)

## 2018-03-10 LAB — TSH: TSH: 0.862 u[IU]/mL (ref 0.350–4.500)

## 2018-03-10 LAB — ETHANOL: Alcohol, Ethyl (B): <10 mg/dL (ref <10)

## 2018-03-10 LAB — MRSA PCR SCREENING: MRSA by PCR: NEGATIVE

## 2018-03-10 LAB — BRAIN NATRIURETIC PEPTIDE: B Natriuretic Peptide: 29 pg/mL (ref 0.0–100.0)

## 2018-03-10 SURGERY — CORONARY/GRAFT ACUTE MI REVASCULARIZATION
Anesthesia: LOCAL

## 2018-03-10 MED ORDER — SODIUM CHLORIDE 0.9 % IV SOLN
INTRAVENOUS | Status: AC | PRN
  Administered 2018-03-10: 10 mL/h via INTRAVENOUS

## 2018-03-10 MED ORDER — HEPARIN SODIUM (PORCINE) 5000 UNIT/ML IJ SOLN
INTRAMUSCULAR | Status: AC
  Administered 2018-03-11: 5000 [IU] via SUBCUTANEOUS
  Filled 2018-03-10: qty 1

## 2018-03-10 MED ORDER — FOLIC ACID 1 MG PO TABS
1.0000 mg | ORAL_TABLET | Freq: Every day | ORAL | Status: DC
  Administered 2018-03-10 – 2018-03-11 (×2): 1 mg via ORAL
  Filled 2018-03-10 (×2): qty 1

## 2018-03-10 MED ORDER — LABETALOL HCL 5 MG/ML IV SOLN: 10.0000 mg | INTRAVENOUS | Status: AC | PRN

## 2018-03-10 MED ORDER — IOHEXOL 350 MG/ML SOLN
INTRAVENOUS | Status: DC | PRN
  Administered 2018-03-10: 165 mL via INTRACARDIAC

## 2018-03-10 MED ORDER — TICAGRELOR 90 MG PO TABS
ORAL_TABLET | ORAL | Status: AC
  Filled 2018-03-10: qty 2

## 2018-03-10 MED ORDER — VERAPAMIL HCL 2.5 MG/ML IV SOLN
INTRAVENOUS | Status: AC
  Filled 2018-03-10: qty 2

## 2018-03-10 MED ORDER — ATORVASTATIN CALCIUM 80 MG PO TABS: 80.0000 mg | ORAL_TABLET | Freq: Every day | ORAL | Status: DC

## 2018-03-10 MED ORDER — MIDAZOLAM HCL 2 MG/2ML IJ SOLN
INTRAMUSCULAR | Status: AC
  Filled 2018-03-10: qty 2

## 2018-03-10 MED ORDER — BIVALIRUDIN BOLUS VIA INFUSION - CUPID
INTRAVENOUS | Status: DC | PRN
  Administered 2018-03-10: 68.025 mg via INTRAVENOUS

## 2018-03-10 MED ORDER — METOPROLOL TARTRATE 12.5 MG HALF TABLET
12.5000 mg | ORAL_TABLET | Freq: Two times a day (BID) | ORAL | Status: DC
  Administered 2018-03-10 – 2018-03-11 (×4): 12.5 mg via ORAL
  Filled 2018-03-10 (×4): qty 1

## 2018-03-10 MED ORDER — LIDOCAINE HCL (PF) 1 % IJ SOLN
INTRAMUSCULAR | Status: AC
  Filled 2018-03-10: qty 30

## 2018-03-10 MED ORDER — MIDAZOLAM HCL 2 MG/2ML IJ SOLN
INTRAMUSCULAR | Status: DC | PRN
  Administered 2018-03-10: 2 mg via INTRAVENOUS

## 2018-03-10 MED ORDER — ONDANSETRON HCL 4 MG/2ML IJ SOLN: 4.0000 mg | Freq: Four times a day (QID) | INTRAMUSCULAR | Status: DC | PRN

## 2018-03-10 MED ORDER — TICAGRELOR 90 MG PO TABS
ORAL_TABLET | ORAL | Status: DC | PRN
  Administered 2018-03-10: 180 mg via ORAL

## 2018-03-10 MED ORDER — SODIUM CHLORIDE 0.9 % IV SOLN
INTRAVENOUS | Status: AC | PRN
  Administered 2018-03-10 (×2): 1.75 mg/kg/h via INTRAVENOUS

## 2018-03-10 MED ORDER — METOPROLOL TARTRATE 25 MG PO TABS: 25.0000 mg | ORAL_TABLET | Freq: Two times a day (BID) | ORAL | Status: DC

## 2018-03-10 MED ORDER — SODIUM CHLORIDE 0.9% FLUSH
3.0000 mL | Freq: Two times a day (BID) | INTRAVENOUS | Status: DC
  Administered 2018-03-10 (×2): 3 mL via INTRAVENOUS

## 2018-03-10 MED ORDER — FENTANYL CITRATE (PF) 100 MCG/2ML IJ SOLN
INTRAMUSCULAR | Status: AC
  Filled 2018-03-10: qty 2

## 2018-03-10 MED ORDER — ATORVASTATIN CALCIUM 80 MG PO TABS
80.0000 mg | ORAL_TABLET | Freq: Every day | ORAL | Status: DC
  Administered 2018-03-10: 80 mg via ORAL
  Filled 2018-03-10: qty 1

## 2018-03-10 MED ORDER — PANTOPRAZOLE SODIUM 40 MG PO TBEC
40.0000 mg | DELAYED_RELEASE_TABLET | Freq: Every day | ORAL | Status: DC
  Administered 2018-03-10 – 2018-03-11 (×2): 40 mg via ORAL
  Filled 2018-03-10 (×2): qty 1

## 2018-03-10 MED ORDER — BIVALIRUDIN TRIFLUOROACETATE 250 MG IV SOLR
INTRAVENOUS | Status: AC
  Filled 2018-03-10: qty 250

## 2018-03-10 MED ORDER — HEPARIN (PORCINE) IN NACL 1000-0.9 UT/500ML-% IV SOLN
INTRAVENOUS | Status: DC | PRN
  Administered 2018-03-10 (×2): 500 mL

## 2018-03-10 MED ORDER — HEPARIN SODIUM (PORCINE) 5000 UNIT/ML IJ SOLN
5000.0000 [IU] | Freq: Three times a day (TID) | INTRAMUSCULAR | Status: DC
  Administered 2018-03-10 – 2018-03-11 (×3): 5000 [IU] via SUBCUTANEOUS
  Filled 2018-03-10 (×3): qty 1

## 2018-03-10 MED ORDER — VERAPAMIL HCL 2.5 MG/ML IV SOLN
INTRAVENOUS | Status: DC | PRN
  Administered 2018-03-10: 10 mL via INTRA_ARTERIAL

## 2018-03-10 MED ORDER — SODIUM CHLORIDE 0.9 % IV SOLN
INTRAVENOUS | Status: DC
  Administered 2018-03-10 (×2): via INTRAVENOUS

## 2018-03-10 MED ORDER — POTASSIUM CHLORIDE CRYS ER 20 MEQ PO TBCR
40.0000 meq | EXTENDED_RELEASE_TABLET | Freq: Once | ORAL | Status: AC
  Administered 2018-03-10: 40 meq via ORAL
  Filled 2018-03-10: qty 2

## 2018-03-10 MED ORDER — SODIUM CHLORIDE 0.9 % IV SOLN
250.0000 mL | INTRAVENOUS | Status: DC | PRN
  Administered 2018-03-11: 250 mL via INTRAVENOUS

## 2018-03-10 MED ORDER — ACETAMINOPHEN 325 MG PO TABS: 650.0000 mg | ORAL_TABLET | ORAL | Status: DC | PRN

## 2018-03-10 MED ORDER — TICAGRELOR 90 MG PO TABS: 90.0000 mg | ORAL_TABLET | Freq: Two times a day (BID) | ORAL | Status: DC

## 2018-03-10 MED ORDER — ACETAMINOPHEN 325 MG PO TABS
650.0000 mg | ORAL_TABLET | ORAL | Status: DC | PRN
  Administered 2018-03-10: 650 mg via ORAL
  Filled 2018-03-10: qty 2

## 2018-03-10 MED ORDER — DIAZEPAM 5 MG PO TABS: 5.0000 mg | ORAL_TABLET | Freq: Four times a day (QID) | ORAL | Status: DC | PRN

## 2018-03-10 MED ORDER — VITAMIN B-1 100 MG PO TABS
100.0000 mg | ORAL_TABLET | Freq: Every day | ORAL | Status: DC
  Administered 2018-03-10 – 2018-03-11 (×2): 100 mg via ORAL
  Filled 2018-03-10 (×2): qty 1

## 2018-03-10 MED ORDER — SODIUM CHLORIDE 0.9 % IV SOLN
1.7500 mg/kg/h | INTRAVENOUS | Status: AC
  Administered 2018-03-10: 1.75 mg/kg/h via INTRAVENOUS
  Filled 2018-03-10: qty 250

## 2018-03-10 MED ORDER — ADULT MULTIVITAMIN W/MINERALS CH
1.0000 | ORAL_TABLET | Freq: Every day | ORAL | Status: DC
  Administered 2018-03-10 – 2018-03-11 (×2): 1 via ORAL
  Filled 2018-03-10 (×2): qty 1

## 2018-03-10 MED ORDER — HEPARIN (PORCINE) IN NACL 1000-0.9 UT/500ML-% IV SOLN
INTRAVENOUS | Status: AC
  Filled 2018-03-10: qty 1500

## 2018-03-10 MED ORDER — TICAGRELOR 90 MG PO TABS
90.0000 mg | ORAL_TABLET | Freq: Two times a day (BID) | ORAL | Status: DC
  Administered 2018-03-10 – 2018-03-11 (×3): 90 mg via ORAL
  Filled 2018-03-10 (×3): qty 1

## 2018-03-10 MED ORDER — NITROGLYCERIN 0.4 MG SL SUBL: 0.4000 mg | SUBLINGUAL_TABLET | SUBLINGUAL | Status: DC | PRN

## 2018-03-10 MED ORDER — LORAZEPAM 2 MG/ML IJ SOLN: 2.0000 mg | INTRAMUSCULAR | Status: DC | PRN

## 2018-03-10 MED ORDER — SODIUM CHLORIDE 0.9% FLUSH: 3.0000 mL | INTRAVENOUS | Status: DC | PRN

## 2018-03-10 MED ORDER — LIDOCAINE HCL (PF) 1 % IJ SOLN
INTRAMUSCULAR | Status: DC | PRN
  Administered 2018-03-10: 2 mL

## 2018-03-10 MED ORDER — ASPIRIN 81 MG PO CHEW
81.0000 mg | CHEWABLE_TABLET | Freq: Every day | ORAL | Status: DC
  Administered 2018-03-10 – 2018-03-11 (×2): 81 mg via ORAL
  Filled 2018-03-10 (×2): qty 1

## 2018-03-10 MED ORDER — FENTANYL CITRATE (PF) 100 MCG/2ML IJ SOLN
INTRAMUSCULAR | Status: DC | PRN
  Administered 2018-03-10: 25 ug via INTRAVENOUS

## 2018-03-10 MED ORDER — HYDRALAZINE HCL 20 MG/ML IJ SOLN: 5.0000 mg | INTRAMUSCULAR | Status: AC | PRN

## 2018-03-10 MED ORDER — ASPIRIN EC 81 MG PO TBEC: 81.0000 mg | DELAYED_RELEASE_TABLET | Freq: Every day | ORAL | Status: DC

## 2018-03-10 MED ORDER — HEPARIN SODIUM (PORCINE) 1000 UNIT/ML IJ SOLN
INTRAMUSCULAR | Status: AC
  Filled 2018-03-10: qty 1

## 2018-03-10 SURGICAL SUPPLY — 19 items
BALLN SAPPHIRE 2.5X15 (BALLOONS) ×2
BALLN SAPPHIRE ~~LOC~~ 2.75X18 (BALLOONS) ×1 IMPLANT
BALLOON SAPPHIRE 2.5X15 (BALLOONS) IMPLANT
CATH INFINITI 5FR ANG PIGTAIL (CATHETERS) ×1 IMPLANT
CATH OPTITORQUE TIG 4.0 5F (CATHETERS) ×1 IMPLANT
CATH VISTA GUIDE 6FR XBLAD3.5 (CATHETERS) ×1 IMPLANT
DEVICE RAD COMP TR BAND LRG (VASCULAR PRODUCTS) ×1 IMPLANT
GLIDESHEATH SLEND SS 6F .021 (SHEATH) ×1 IMPLANT
GUIDEWIRE INQWIRE 1.5J.035X260 (WIRE) IMPLANT
INQWIRE 1.5J .035X260CM (WIRE) ×2
KIT ENCORE 26 ADVANTAGE (KITS) ×1 IMPLANT
KIT HEART LEFT (KITS) ×2 IMPLANT
PACK CARDIAC CATHETERIZATION (CUSTOM PROCEDURE TRAY) ×2 IMPLANT
SHEATH PROBE COVER 6X72 (BAG) ×1 IMPLANT
STENT SIERRA 2.50 X 28 MM (Permanent Stent) ×1 IMPLANT
SYR MEDRAD MARK V 150ML (SYRINGE) ×2 IMPLANT
TRANSDUCER W/STOPCOCK (MISCELLANEOUS) ×2 IMPLANT
TUBING CIL FLEX 10 FLL-RA (TUBING) ×2 IMPLANT
WIRE PT2 MS 185 (WIRE) ×1 IMPLANT

## 2018-03-10 NOTE — ED Notes (Signed)
Given aspirin and 1 NTG PTA.

## 2018-03-10 NOTE — ED Provider Notes (Signed)
[facility] Provider Note   CSN: [id number] Arrival date & time: 03/10/18  0119     History   Chief Complaint Chief Complaint  Patient presents with  . Code STEMI    HPI Joylene DraftFloyd T Tofte Jr. is a 47 y.o. male.  The history is provided by the patient.  Chest Pain   This is a new problem. The current episode started 3 to 5 hours ago. The problem occurs constantly. The problem has not changed since onset.The pain is associated with rest. The pain is present in the substernal region. The quality of the pain is described as heavy. The pain radiates to the left shoulder and left arm. Associated symptoms include shortness of breath. Pertinent negatives include no fever and no palpitations. Treatments tried: Goody powder. The treatment provided no relief. Risk factors include male gender, obesity and smoking/tobacco exposure.  Pertinent negatives for past medical history include no stimulant use.  Pertinent negatives for family medical history include: no Marfan's syndrome.  Procedure history is negative for cardiac catheterization.    Past Medical History:  Diagnosis Date  . Anxiety   . Bipolar disorder (HCC)   . Depression   . Dyspnea   . GERD (gastroesophageal reflux disease)     Patient Active Problem List   Diagnosis Date Noted  . Depression 05/07/2017  . Bipolar disorder (HCC) 05/07/2017  . Community acquired pneumonia 05/07/2017  . Pneumonia 05/07/2017  . Chest pain 05/13/2012  . Tobacco abuse 05/10/2012  . Alcohol abuse 05/10/2012  . Transaminitis 05/10/2012  . GERD (gastroesophageal reflux disease) 05/10/2012    Past Surgical History:  Procedure Laterality Date  . FOOT SURGERY    . NO PAST SURGERIES          Home Medications    Prior to Admission medications   Medication Sig Start Date End Date Taking? Authorizing Provider  bacitracin ointment Apply 1 application topically 2 (two) times daily. 09/11/17   Derwood KaplanNanavati, Ankit,  MD  ibuprofen (ADVIL,MOTRIN) 400 MG tablet Take 1 tablet (400 mg total) by mouth every 6 (six) hours as needed for moderate pain. 09/25/17   Georgetta HaberBurky, Natalie B, NP  omeprazole (PRILOSEC) 20 MG capsule Take 1 capsule (20 mg total) by mouth daily. 09/25/17   Georgetta HaberBurky, Natalie B, NP    Family History Family History  Problem Relation Age of Onset  . Diabetes Mother   . Hyperlipidemia Father   . Hypertension Father   . Coronary artery disease Father        Stents placed  . Heart disease Maternal Grandmother   . Heart disease Paternal Grandmother     Social History Social History   Tobacco Use  . Smoking status: Current Every Day Smoker    Packs/day: 0.00  . Smokeless tobacco: Never Used  Substance Use Topics  . Alcohol use: Yes    Alcohol/week: 6.0 standard drinks    Types: 6 Cans of beer per week    Comment: 6 pack per day  . Drug use: Yes    Types: Marijuana     Allergies   Penicillins and Tramadol hcl   Review of Systems Review of Systems  Constitutional: Negative for fever.  Respiratory: Positive for shortness of breath.   Cardiovascular: Positive for chest pain. Negative for palpitations and leg swelling.  Gastrointestinal: Positive for diarrhea.  Musculoskeletal: Positive for arthralgias.  All other systems reviewed and are negative.    Physical Exam Updated Vital Signs There were no vitals  taken for this visit.  Physical Exam  Constitutional: He is oriented to person, place, and time. He appears well-developed and well-nourished. No distress.  HENT:  Head: Normocephalic and atraumatic.  Mouth/Throat: No oropharyngeal exudate.  Eyes: Pupils are equal, round, and reactive to light. Conjunctivae are normal.  Neck: Normal range of motion. Neck supple.  Cardiovascular: Normal rate, regular rhythm, normal heart sounds and intact distal pulses.  Pulmonary/Chest: Effort normal and breath sounds normal. No stridor. He has no wheezes. He has no rales.  Abdominal: Soft.  Bowel sounds are normal. He exhibits no mass. There is no tenderness. There is no rebound and no guarding.  Musculoskeletal: Normal range of motion. He exhibits no edema or tenderness.  Neurological: He is alert and oriented to person, place, and time. He displays normal reflexes.  Skin: Skin is warm. Capillary refill takes less than 2 seconds.  Psychiatric: He has a normal mood and affect.     ED Treatments / Results  Labs (all labs ordered are listed, but only abnormal results are displayed) Labs Reviewed  CBC WITH DIFFERENTIAL/PLATELET  PROTIME-INR  ETHANOL  RAPID URINE DRUG SCREEN, HOSP PERFORMED  I-STAT CHEM 8, ED  I-STAT TROPONIN, ED    EKG EKG Interpretation  Date/Time:  Saturday March 10 2018 01:23:24 EDT Ventricular Rate:  75 PR Interval:    QRS Duration: 99 QT Interval:  420 QTC Calculation: 470 R Axis:   55 Text Interpretation:  Sinus rhythm Ventricular premature complex Abnormal R-wave progression, early transition Inferior infarct, acute (RCA) Lateral leads are also involved Probable RV involvement, suggest recording right precordial leads >>> Acute MI <<< Confirmed by Nicanor Alcon, Tyshawn Keel ([id number]) on 03/10/2018 1:26:32 AM   Radiology No results found.  Procedures Procedures (including critical care time)  Medications Ordered in ED Medications  heparin 5000 UNIT/ML injection (has no administration in time range)      Final Clinical Impressions(s) / ED Diagnoses   Final diagnoses:  Acute ST elevation myocardial infarction (STEMI) involving right coronary artery [facility])    Cardiology at bedside to the cath lab   [facility], Tyeisha Dinan, MD 03/10/18 0131

## 2018-03-10 NOTE — ED Notes (Signed)
4000 units heparin given 

## 2018-03-10 NOTE — Progress Notes (Signed)
TR band removed at 1230. Right radial site level 0, patient educated on use of RUE and when to notify nurse. Will continue to monitor.

## 2018-03-10 NOTE — Progress Notes (Signed)
  Echocardiogram 2D Echocardiogram has been performed.  Celene SkeenVijay  Nelta Caudill 03/10/2018, 3:25 PM

## 2018-03-10 NOTE — H&P (Signed)
Cardiology Admission History and Physical:   Patient ID: Dustin Olsen. MRN: [id number]; DOB: 01-Jan-1971   Admission date: 03/10/2018  Primary Care Provider: Patient, No Pcp Per Primary Cardiologist: Dr. Eldridge Dace   Chief Complaint:  STEMI  Patient Profile:   Dustin Olsen. is a 47 y.o. male with with a history of tobacco abuse, alcohol abuse, bipolar disorder, GERD who presents with STEMI.   History of Present Illness:   Dustin Olsen. is a 47 y.o. male with with a history of tobacco abuse, alcohol abuse, bipolar disorder, GERD who presents with STEMI.   The patient has no cardiac history, although he has been evaluated by cardiology previously for chest discomfort; he refused recommended stress test. He describes an unspecified history of chest pain for at least several months. He states that this evening he noticed swelling in his R foot and a rash on his leg and hip. He then had episode of diarrhea at which time he developed chest pain with associated dyspnea, all occurring about 2-3 hours ago. He called 911 and was evaluated by EMS and was found on ECG to have ST elevations in inferior leads with reciprocal depressions. He was given ASA and zofran and NTG and brought to the ED.  Of note, he does have a history of alcohol abuse complicated by prior withdrawals. He has had prior ED evaluations during which time he was acutely intoxicated. He notes that he has been drinking less recently and experienced withdrawal last week. He had one beer yesterday at lunch time. He also reports that he used CBD oil for the first time yesterday.   The patient was brought directly to the cath lab via the ED. He underwent LHC that showed thrombotic lesion in mid LCx for which he underwent PCI with DES. He was loaded with ticagrelor in the cath lab.  Past Medical History:  Diagnosis Date  . Anxiety   . Bipolar disorder (HCC)   . Depression   . Dyspnea   . GERD (gastroesophageal reflux disease)      Past Surgical History:  Procedure Laterality Date  . FOOT SURGERY    . NO PAST SURGERIES       Medications Prior to Admission: Prior to Admission medications   Medication Sig Start Date End Date Taking? Authorizing Provider  bacitracin ointment Apply 1 application topically 2 (two) times daily. 09/11/17   Derwood Kaplan, MD  ibuprofen (ADVIL,MOTRIN) 400 MG tablet Take 1 tablet (400 mg total) by mouth every 6 (six) hours as needed for moderate pain. 09/25/17   Georgetta Haber, NP  omeprazole (PRILOSEC) 20 MG capsule Take 1 capsule (20 mg total) by mouth daily. 09/25/17   Georgetta Haber, NP     Allergies:    Allergies  Allergen Reactions  . Penicillins Rash    Has patient had a PCN reaction causing immediate rash, facial/tongue/throat swelling, SOB or lightheadedness with hypotension: YES Has patient had a PCN reaction causing severe rash involving mucus membranes or skin necrosis: NO Has patient had a PCN reaction that required hospitalization: NO Has patient had a PCN reaction occurring within the last 10 years: NO If all of the above answers are "NO", then may proceed with Cephalosporin use.  . Tramadol Hcl Hives    Social History:   Social History   Socioeconomic History  . Marital status: Single    Spouse name: Not on file  . Number of children: Not on file  . Years  of education: Not on file  . Highest education level: Not on file  Occupational History  . Not on file  Social Needs  . Financial resource strain: Not on file  . Food insecurity:    Worry: Not on file    Inability: Not on file  . Transportation needs:    Medical: Not on file    Non-medical: Not on file  Tobacco Use  . Smoking status: Current Every Day Smoker    Packs/day: 0.00  . Smokeless tobacco: Never Used  Substance and Sexual Activity  . Alcohol use: Yes    Alcohol/week: 6.0 standard drinks    Types: 6 Cans of beer per week    Comment: 6 pack per day  . Drug use: Yes    Types: Marijuana   . Sexual activity: Not on file  Lifestyle  . Physical activity:    Days per week: Not on file    Minutes per session: Not on file  . Stress: Not on file  Relationships  . Social connections:    Talks on phone: Not on file    Gets together: Not on file    Attends religious service: Not on file    Active member of club or organization: Not on file    Attends meetings of clubs or organizations: Not on file    Relationship status: Not on file  . Intimate partner violence:    Fear of current or ex partner: Not on file    Emotionally abused: Not on file    Physically abused: Not on file    Forced sexual activity: Not on file  Other Topics Concern  . Not on file  Social History Narrative   ** Merged History Encounter **        Family History:   The patient's family history includes Coronary artery disease in his father; Diabetes in his mother; Heart disease in his maternal grandmother and paternal grandmother; Hyperlipidemia in his father; Hypertension in his father.    ROS:  Please see the history of present illness.  All other ROS reviewed and negative.     Physical Exam/Data:   Vitals:   03/10/18 0128 03/10/18 0146 03/10/18 0200  BP: 116/82    Pulse: 75    Resp: 20    Temp: (!) 97.4 F (36.3 C)    TempSrc: Oral    SpO2: 100% 100%   Weight:   90.7 kg  Height:   5\' 11"  (1.803 m)   No intake or output data in the 24 hours ending 03/10/18 0213 Filed Weights   03/10/18 0200  Weight: 90.7 kg   Body mass index is 27.89 kg/m.  General:  Chronically ill appearing in moderate distress HEENT: poor dentition Neck: no apparent JVD Vascular: No carotid bruits; FA pulses 2+ bilaterally without bruits  Cardiac:  normal S1, S2; RRR; no murmur  Lungs:  clear to auscultation bilaterally, no wheezing, rhonchi or rales  Abd: soft, nontender Ext: Some swelling in R foot with mild erythema  Musculoskeletal:  No deformities, BUE and BLE strength normal and equal Skin: warm and dry   Neuro:  No focal abnormalities noted Psych: AOx3   EKG:  The ECG that was done and was personally reviewed and demonstrates NSR with ST elevations in inferior leads with depressions in V1-V3  Relevant CV Studies: None  Laboratory Data:  Chemistry Recent Labs  Lab 03/10/18 0131  NA 136  K 3.3*  CL 103  GLUCOSE 139*  BUN  15  CREATININE 0.70    No results for input(s): PROT, ALBUMIN, AST, ALT, ALKPHOS, BILITOT in the last 168 hours. Hematology Recent Labs  Lab 03/10/18 0131  HGB 16.0  HCT 47.0   Cardiac EnzymesNo results for input(s): TROPONINI in the last 168 hours.  Recent Labs  Lab 03/10/18 0128  TROPIPOC 0.00    BNPNo results for input(s): BNP, PROBNP in the last 168 hours.  DDimer No results for input(s): DDIMER in the last 168 hours.  Radiology/Studies:  Dg Chest Portable 1 View  Result Date: 03/10/2018 CLINICAL DATA:  Intermittent chest pain worsening today with dyspnea. EXAM: PORTABLE CHEST 1 VIEW COMPARISON:  09/25/2017 CXR FINDINGS: Stable cardiomegaly. Minimal aortic atherosclerosis. Interstitial edema is noted. Interval healing previously noted right-sided rib fractures with callus now noted. No pulmonary consolidation, effusion or pneumothorax. Remote fracture deformity of the distal left clavicle and along the course of the coracoclavicular ligament. IMPRESSION: Stable cardiomegaly with aortic atherosclerosis. Mild interstitial edema. Electronically Signed   By: Tollie Ethavid  Kwon M.D.   On: 03/10/2018 01:51    Assessment and Plan:   CAD with STEMI The patient has no known cardiac history but has risk factors for CAD including tobacco abuse. He presented with acutely worsening chest discomfort and was found to have STEMI with occlusive lesion of LCX. He underwent successful PCI with DES. -Admit to ICU. -Continue ASA, ticagrelor -Atorvastatin ordered -Metoprolol ordered -HgA1c, lipid panel ordered -Echocardiogram ordered  Alcohol abuse Substance  abuse Tobacco abuse The patient has a long history of substance abuse with prior withdrawals. He reports decreased frequency of alcohol intake over the past week with withdrawal last week. He also reports recent CBD use and occasional MJA use in addition to smoking 1 PPD cigarrettes. -Ethanol level ordered -CIWA ordered -Thiamine, folate ordered -Would benefit from tobacco/alcohol/substance abuse counseling  Rash LE edema The patient reports pain and swelling of R foot. Etiology is unclear but may be due to gout or DVT or phlebitis. Edema related to HF is less likely. He also reports non-specifc rash of his LE. This may be due to above ddx or possible reaction to CBD use. -Continue to monitor symptoms -LE US ordered  Bipolar disorder Not currently on medical treatment.   GERD -Continue PPI   Severity of Illness: The appropriate patient status for this patient is INPATIENT. Inpatient status is judged to be reasonable and necessary in order to provide the required intensity of service to ensure the patient's safety. The patient's presenting symptoms, physical exam findings, and initial radiographic and laboratory data in the context of their chronic comorbidities is felt to place them at high risk for further clinical deterioration. Furthermore, it is not anticipated that the patient will be medically stable for discharge from the hospital within 2 midnights of admission. The following factors support the patient status of inpatient.   " The patient's presenting symptoms include chest pain. " The worrisome physical exam findings include diaphoresis. " The initial radiographic and laboratory data are worrisome because of ST elevations on ECG. " The chronic co-morbidities include tobacco abuse.   * I certify that at the point of admission it is my clinical judgment that the patient will require inpatient hospital care spanning beyond 2 midnights from the point of admission due to high  intensity of service, high risk for further deterioration and high frequency of surveillance required.*    For questions or updates, please contact [facility] Please consult [email or web address] for contact info under  Signed, Ernest Mallick, MD  03/10/2018 2:13 AM

## 2018-03-10 NOTE — Progress Notes (Signed)
Attempted echo.  Patient was unavailable because he had to use the bathroom urgently.  Will attempt at a later time.

## 2018-03-10 NOTE — Progress Notes (Signed)
Ed completed with pt. Voiced understanding. Discussed MI, stent, Brilinta/ASA, restrictions, diet, ex, smoking cessation, and CRPII and NTG. Will send referral to [facility] CRPII. Pt will walk with RN after echo. Encouraged pt to read info over weekend. He is not sure about quitting smoking but enjoyed the fake cigarette. He sts he needs to sleep, not sleeping. [id number] Ethelda ChickKristan Godric Lavell CES, ACSM 2:39 PM 03/10/2018

## 2018-03-10 NOTE — ED Notes (Signed)
Pt transported to cath lab with Sonny Mastersandace, RN

## 2018-03-10 NOTE — Progress Notes (Signed)
Progress Note  Patient Name: Dustin DraftFloyd T Bibbins Jr. Date of Encounter: 03/10/2018  Primary Cardiologist: No primary care provider on file. new Dr. Tresa EndoKelly  Subjective   Still has a constant pressure sensation in his chest. No nausea or dyspnea.   Inpatient Medications    Scheduled Meds: . aspirin  81 mg Oral Daily  . atorvastatin  80 mg Oral q1800  . folic acid  1 mg Oral Daily  . heparin      . heparin  5,000 Units Subcutaneous Q8H  . metoprolol tartrate  12.5 mg Oral BID  . multivitamin with minerals  1 tablet Oral Daily  . pantoprazole  40 mg Oral Daily  . sodium chloride flush  3 mL Intravenous Q12H  . thiamine  100 mg Oral Daily  . ticagrelor  90 mg Oral BID   Continuous Infusions: . sodium chloride 150 mL/hr at 03/10/18 0700  . sodium chloride     PRN Meds: sodium chloride, acetaminophen, diazepam, hydrALAZINE, labetalol, LORazepam, nitroGLYCERIN, ondansetron (ZOFRAN) IV, sodium chloride flush   Vital Signs    Vitals:   03/10/18 0400 03/10/18 0500 03/10/18 0600 03/10/18 0700  BP: 117/83 105/74 112/73 109/68  Pulse: 82 80 76 71  Resp: 20 (!) 24 17 20   Temp:      TempSrc:      SpO2: 94% 95% 93% 92%  Weight:      Height:        Intake/Output Summary (Last 24 hours) at 03/10/2018 0740 Last data filed at 03/10/2018 0700 Gross per 24 hour  Intake 593.36 ml  Output 300 ml  Net 293.36 ml   Filed Weights   03/10/18 0200  Weight: 90.7 kg    Telemetry    NSR - Personally Reviewed  ECG    NSR ST elevation in inferior leads resolved. - Personally Reviewed  Physical Exam   GEN: No acute distress.   Neck: No JVD Cardiac: RRR, no murmurs, rubs, or gallops.  Respiratory: Clear to auscultation bilaterally. GI: Soft, nontender, non-distended  MS: No edema; No deformity. Radial band in place. Neuro:  Nonfocal  Psych: Normal affect   Labs    Chemistry Recent Labs  Lab 03/10/18 0131  NA 136  K 3.3*  CL 103  GLUCOSE 139*  BUN 15  CREATININE 0.70      Hematology Recent Labs  Lab 03/10/18 0126 03/10/18 0131 03/10/18 0609  WBC 8.6  --  10.2  RBC 4.72  --  4.50  HGB 15.4 16.0 14.5  HCT 46.7 47.0 43.6  MCV 98.9  --  96.9  MCH 32.6  --  32.2  MCHC 33.0  --  33.3  RDW 13.2  --  13.2  PLT 166  --  168    Cardiac EnzymesNo results for input(s): TROPONINI in the last 168 hours.  Recent Labs  Lab 03/10/18 0128  TROPIPOC 0.00     BNPNo results for input(s): BNP, PROBNP in the last 168 hours.   DDimer No results for input(s): DDIMER in the last 168 hours.   Radiology    Dg Chest Portable 1 View  Result Date: 03/10/2018 CLINICAL DATA:  Intermittent chest pain worsening today with dyspnea. EXAM: PORTABLE CHEST 1 VIEW COMPARISON:  09/25/2017 CXR FINDINGS: Stable cardiomegaly. Minimal aortic atherosclerosis. Interstitial edema is noted. Interval healing previously noted right-sided rib fractures with callus now noted. No pulmonary consolidation, effusion or pneumothorax. Remote fracture deformity of the distal left clavicle and along the course of the coracoclavicular  ligament. IMPRESSION: Stable cardiomegaly with aortic atherosclerosis. Mild interstitial edema. Electronically Signed   By: Tollie Eth M.D.   On: 03/10/2018 01:51    Cardiac Studies   Procedures   Coronary/Graft Acute MI Revascularization  LEFT HEART CATH AND CORONARY ANGIOGRAPHY  Conclusion     Prox RCA to Mid RCA lesion is 10% stenosed.  Dist RCA lesion is 20% stenosed.  Prox Cx to Mid Cx lesion is 80% stenosed.  Mid Cx lesion is 99% stenosed.  A stent was successfully placed.  Post intervention, there is a 0% residual stenosis.  Post intervention, there is a 0% residual stenosis.  The left ventricular systolic function is normal.  LV end diastolic pressure is normal.    Acute ST segment elevation myocardial infarction secondary to subtotal stenosis of the mid left circumflex coronary artery.  Normal left anterior descending artery.  Mild  nonobstructive disease in the RCA with 10% mild luminal irregularity in the mid segment and 20% smooth distal stenosis.  Preserved LV contractility with an ejection fraction of 55% without definitive wall motion abnormalities.  LVEDP 17 mmHg.  Successful PCI to the mid circumflex treated with PTCA and insertion of a 2.5 x 28 mm Xience Sierra DES stent postdilated to 2.75 mm with the tandem 80 and 99% stenosis being reduced to 0%.  RECOMMENDATION: Recommend uninterrupted dual antiplatelet therapy with Aspirin 81mg  daily and Ticagrelor 90mg  twice daily for a minimum of 12 months (ACS - Class I recommendation).  Patient will be started on post MI beta-blocker therapy with possible ACE inhibition depending upon blood pressure.  High potency statin therapy will be initiated.  Smoking cessation is essential.     Patient Profile     47 y.o. male with history of Etoh and tobacco abuse presents with acute inferior STEMI secondary to LCx occlusion.  Assessment & Plan    1. Acute inferior STEMI. S/p DES of LCx. No other obstructive disease. LV function looked good. On DAPT with ASA and Brilinta. On low dose metoprolol. Mild residual chest pressure that he reports he has had for one year. ? GERD. On PPI. Serial troponins pending. Check Echo. Observe in the unit today. Probable DC Monday 2. Etoh abuse on CIWA protocol. Etoh level 0. 3. Tobacco abuse counseled on smoking cessation. 4. RLE edema. LE doppler ordered. Exam looks pretty benign today.  5. GERD on PPI.  6. Bipolar disorder.  For questions or updates, please contact [facility] Please consult [email or web address] for contact info under        Signed, Peter [place], MD  03/10/2018, 7:40 AM

## 2018-03-10 NOTE — H&P (Addendum)
Cardiology Admission History and Physical:   Patient ID: Dustin Olsen. MRN: [id number]; DOB: 07/05/1970   Admission date: 03/10/2018  Primary Care Provider: Patient, No Pcp Per Primary Cardiologist: Dr. Eldridge Dace   Chief Complaint:  STEMI  Patient Profile:   Dustin Olsen. is a 47 y.o. male with with a history of tobacco abuse, alcohol abuse, bipolar disorder, GERD who presents with STEMI.   History of Present Illness:   Dustin Olsen. is a 47 y.o. male with with a history of tobacco abuse, alcohol abuse, bipolar disorder, GERD who presents with STEMI.   The patient has no cardiac history, although he has been evaluated by cardiology previously for chest discomfort; he refused recommended stress test. He describes an unspecified history of chest pain for at least several months. He states that this evening he noticed swelling in his R foot and a rash on his leg and hip. He then had episode of diarrhea at which time he developed chest pain with associated dyspnea, all occurring about 2-3 hours ago. He called 911 and was evaluated by EMS and was found on ECG to have ST elevations in inferior leads with reciprocal depressions. He was given ASA and zofran and NTG and brought to the ED.  Of note, he does have a history of alcohol abuse complicated by prior withdrawals. He has had prior ED evaluations during which time he was acutely intoxicated. He notes that he has been drinking less recently and experienced withdrawal last week. He had one beer yesterday at lunch time. He also reports that he used CBD oil for the first time yesterday.   The patient was brought directly to the cath lab via the ED. He underwent LHC that showed thrombotic lesion in mid LCx for which he underwent PCI with DES. He was loaded with ticagrelor in the cath lab.  Past Medical History:  Diagnosis Date  . Anxiety   . Bipolar disorder (HCC)   . Depression   . Dyspnea   . GERD (gastroesophageal reflux disease)      Past Surgical History:  Procedure Laterality Date  . FOOT SURGERY    . NO PAST SURGERIES       Medications Prior to Admission: Prior to Admission medications   Medication Sig Start Date End Date Taking? Authorizing Provider  bacitracin ointment Apply 1 application topically 2 (two) times daily. 09/11/17   Derwood Kaplan, MD  ibuprofen (ADVIL,MOTRIN) 400 MG tablet Take 1 tablet (400 mg total) by mouth every 6 (six) hours as needed for moderate pain. 09/25/17   Georgetta Haber, NP  omeprazole (PRILOSEC) 20 MG capsule Take 1 capsule (20 mg total) by mouth daily. 09/25/17   Georgetta Haber, NP     Allergies:    Allergies  Allergen Reactions  . Penicillins Rash    Has patient had a PCN reaction causing immediate rash, facial/tongue/throat swelling, SOB or lightheadedness with hypotension: YES Has patient had a PCN reaction causing severe rash involving mucus membranes or skin necrosis: NO Has patient had a PCN reaction that required hospitalization: NO Has patient had a PCN reaction occurring within the last 10 years: NO If all of the above answers are "NO", then may proceed with Cephalosporin use.  . Tramadol Hcl Hives    Social History:   Social History   Socioeconomic History  . Marital status: Single    Spouse name: Not on file  . Number of children: Not on file  . Years  of education: Not on file  . Highest education level: Not on file  Occupational History  . Not on file  Social Needs  . Financial resource strain: Not on file  . Food insecurity:    Worry: Not on file    Inability: Not on file  . Transportation needs:    Medical: Not on file    Non-medical: Not on file  Tobacco Use  . Smoking status: Current Every Day Smoker    Packs/day: 0.00  . Smokeless tobacco: Never Used  Substance and Sexual Activity  . Alcohol use: Yes    Alcohol/week: 6.0 standard drinks    Types: 6 Cans of beer per week    Comment: 6 pack per day  . Drug use: Yes    Types: Marijuana   . Sexual activity: Not on file  Lifestyle  . Physical activity:    Days per week: Not on file    Minutes per session: Not on file  . Stress: Not on file  Relationships  . Social connections:    Talks on phone: Not on file    Gets together: Not on file    Attends religious service: Not on file    Active member of club or organization: Not on file    Attends meetings of clubs or organizations: Not on file    Relationship status: Not on file  . Intimate partner violence:    Fear of current or ex partner: Not on file    Emotionally abused: Not on file    Physically abused: Not on file    Forced sexual activity: Not on file  Other Topics Concern  . Not on file  Social History Narrative   ** Merged History Encounter **        Family History:   The patient's family history includes Coronary artery disease in his father; Diabetes in his mother; Heart disease in his maternal grandmother and paternal grandmother; Hyperlipidemia in his father; Hypertension in his father.    ROS:  Please see the history of present illness.  All other ROS reviewed and negative.     Physical Exam/Data:   Vitals:   03/10/18 0128 03/10/18 0146 03/10/18 0200  BP: 116/82    Pulse: 75    Resp: 20    Temp: (!) 97.4 F (36.3 C)    TempSrc: Oral    SpO2: 100% 100%   Weight:   90.7 kg  Height:   5\' 11"  (1.803 m)   No intake or output data in the 24 hours ending 03/10/18 0213 Filed Weights   03/10/18 0200  Weight: 90.7 kg   Body mass index is 27.89 kg/m.  General:  Chronically ill appearing in moderate distress HEENT: poor dentition Neck: no apparent JVD Cardiac:  normal S1, S2; RRR; no murmur  Lungs:  clear to auscultation bilaterally, no wheezing, rhonchi or rales  Abd: soft, nontender Ext: Some swelling in R foot with mild erythema. Wheals noted on RLE and R hip Musculoskeletal:  No deformities, BUE and BLE strength normal and equal Skin: warm and dry  Neuro:  No focal abnormalities  noted Psych: AOx3   EKG:  The ECG that was done and was personally reviewed and demonstrates NSR with ST elevations in inferior leads with depressions in V1-V3  Relevant CV Studies: None  Laboratory Data:  Chemistry Recent Labs  Lab 03/10/18 0131  NA 136  K 3.3*  CL 103  GLUCOSE 139*  BUN 15  CREATININE 0.70  No results for input(s): PROT, ALBUMIN, AST, ALT, ALKPHOS, BILITOT in the last 168 hours. Hematology Recent Labs  Lab 03/10/18 0131  HGB 16.0  HCT 47.0   Cardiac EnzymesNo results for input(s): TROPONINI in the last 168 hours.  Recent Labs  Lab 03/10/18 0128  TROPIPOC 0.00    BNPNo results for input(s): BNP, PROBNP in the last 168 hours.  DDimer No results for input(s): DDIMER in the last 168 hours.  Radiology/Studies:  Dg Chest Portable 1 View  Result Date: 03/10/2018 CLINICAL DATA:  Intermittent chest pain worsening today with dyspnea. EXAM: PORTABLE CHEST 1 VIEW COMPARISON:  09/25/2017 CXR FINDINGS: Stable cardiomegaly. Minimal aortic atherosclerosis. Interstitial edema is noted. Interval healing previously noted right-sided rib fractures with callus now noted. No pulmonary consolidation, effusion or pneumothorax. Remote fracture deformity of the distal left clavicle and along the course of the coracoclavicular ligament. IMPRESSION: Stable cardiomegaly with aortic atherosclerosis. Mild interstitial edema. Electronically Signed   By: Tollie Eth M.D.   On: 03/10/2018 01:51    Assessment and Plan:   CAD with STEMI The patient has no known cardiac history but has risk factors for CAD including tobacco abuse and family history. He presented with acutely worsening chest discomfort and was found to have STEMI with thrombotic lesion of LCX. He underwent successful PCI with DES. -Admit to ICU -Continue ASA, ticagrelor -Atorvastatin ordered -Metoprolol ordered -HgA1c, lipid panel ordered -Echocardiogram ordered  Alcohol abuse Substance abuse Tobacco  abuse The patient has a long history of substance abuse with prior withdrawals. He reports decreased frequency of alcohol intake recently with withdrawal last week. He also reports recent CBD use and occasional MJA use in addition to smoking 1 PPD cigarrettes. Suspect ongoing withdrawal may be contributing to recent diarrhea.  -Ethanol level, UDS ordered -CIWA ordered -Thiamine, folate ordered -Would benefit from tobacco/alcohol/substance abuse counseling  Rash LE edema The patient reports pain and swelling of R foot. Etiology is unclear but may be due to gout or DVT or phlebitis. Edema related to HF is less likely. He also reports non-specifc rash of his LE. This may be due to above ddx or possible reaction to CBD use. -Continue to monitor symptoms -LE [place] ordered  Bipolar disorder Not currently on medical treatment.   GERD -Continue PPI   Severity of Illness: The appropriate patient status for this patient is INPATIENT. Inpatient status is judged to be reasonable and necessary in order to provide the required intensity of service to ensure the patient's safety. The patient's presenting symptoms, physical exam findings, and initial radiographic and laboratory data in the context of their chronic comorbidities is felt to place them at high risk for further clinical deterioration. Furthermore, it is not anticipated that the patient will be medically stable for discharge from the hospital within 2 midnights of admission. The following factors support the patient status of inpatient.   " The patient's presenting symptoms include chest pain. " The worrisome physical exam findings include diaphoresis. " The initial radiographic and laboratory data are worrisome because of ST elevations on ECG. " The chronic co-morbidities include tobacco abuse.   * I certify that at the point of admission it is my clinical judgment that the patient will require inpatient hospital care spanning beyond 2  midnights from the point of admission due to high intensity of service, high risk for further deterioration and high frequency of surveillance required.*    For questions or updates, please contact [facility] Please consult [email or web address] for contact info  under        Signed, Ernest Mallickaylor Bazemore, MD  03/10/2018 2:13 AM   Patient seen and examined. Agree with assessment and plan.  Mr. Ulyess BlossomFloyd Sloop is a 47 year old gentleman who has a long-standing history of tobacco abuse since age 47, history of EtOH use, who presented developed significant chest pain several hours associated with dyspnea.  He called EMS and ECG reveals acute inferior injury with ST elevation inferiorly and ST depression in leads V1 through V3.  Upon presentation to the catheterization laboratory the patient still had residual chest tightness.  He had been given heparin upon arrival to the emergency room prior to transfer to the catheterization laboratory.  Due to somewhat low blood pressure in the 90s, he was started on fluid bolus.  I discussed urgent catheterization high likelihood of high-grade blockage in either the RCA or circumflex vessel. Lennette Biharihomas A. Kelly, MD, [facility] 03/10/2018 2:54 AM

## 2018-03-10 NOTE — ED Triage Notes (Signed)
Pt BIB [facility], c/o intermittent chest pain x "a while" worsening today. Mild shortness of breath. States he took CBD today for possible allergic reaction.

## 2018-03-11 ENCOUNTER — Encounter (HOSPITAL_COMMUNITY): Payer: Self-pay | Admitting: Physician Assistant

## 2018-03-11 ENCOUNTER — Other Ambulatory Visit: Payer: Self-pay

## 2018-03-11 DIAGNOSIS — I2510 Atherosclerotic heart disease of native coronary artery without angina pectoris: Secondary | ICD-10-CM | POA: Diagnosis present

## 2018-03-11 MED ORDER — ATORVASTATIN CALCIUM 80 MG PO TABS: 80.0000 mg | ORAL_TABLET | Freq: Every day | ORAL | 1 refills | Status: DC

## 2018-03-11 MED ORDER — NITROGLYCERIN 0.4 MG SL SUBL: 0.4000 mg | SUBLINGUAL_TABLET | SUBLINGUAL | 12 refills | Status: DC | PRN

## 2018-03-11 MED ORDER — TICAGRELOR 90 MG PO TABS: 90.0000 mg | ORAL_TABLET | Freq: Two times a day (BID) | ORAL | 3 refills | Status: DC

## 2018-03-11 MED ORDER — METOPROLOL TARTRATE 25 MG PO TABS: 12.5000 mg | ORAL_TABLET | Freq: Two times a day (BID) | ORAL | 6 refills | Status: DC

## 2018-03-11 MED ORDER — ASPIRIN 81 MG PO CHEW: 81.0000 mg | CHEWABLE_TABLET | Freq: Every day | ORAL | Status: DC

## 2018-03-11 NOTE — Plan of Care (Signed)

## 2018-03-11 NOTE — Discharge Instructions (Signed)

## 2018-03-11 NOTE — Progress Notes (Signed)
Progress Note  Patient Name: Dustin Olsen. Date of Encounter: 03/11/2018  Primary Cardiologist: Nicki Guadalajara, MD   Subjective   Feels very well today. No chest pain. Slept well.  No nausea or dyspnea.   Inpatient Medications    Scheduled Meds: . aspirin  81 mg Oral Daily  . atorvastatin  80 mg Oral q1800  . folic acid  1 mg Oral Daily  . heparin  5,000 Units Subcutaneous Q8H  . metoprolol tartrate  12.5 mg Oral BID  . multivitamin with minerals  1 tablet Oral Daily  . pantoprazole  40 mg Oral Daily  . sodium chloride flush  3 mL Intravenous Q12H  . thiamine  100 mg Oral Daily  . ticagrelor  90 mg Oral BID   Continuous Infusions: . sodium chloride 100 mL/hr at 03/11/18 0000  . sodium chloride 10 mL/hr at 03/11/18 0600   PRN Meds: sodium chloride, acetaminophen, diazepam, LORazepam, nitroGLYCERIN, ondansetron (ZOFRAN) IV, sodium chloride flush   Vital Signs    Vitals:   03/11/18 0300 03/11/18 0400 03/11/18 0500 03/11/18 0600  BP: 126/83 120/72 114/82 131/87  Pulse: 63 (!) 59 (!) 57 67  Resp: 20 14 18 16   Temp:  97.9 F (36.6 C)    TempSrc:  Oral    SpO2: 94% 95% 95% 95%  Weight:    95.3 kg  Height:        Intake/Output Summary (Last 24 hours) at 03/11/2018 0721 Last data filed at 03/11/2018 0600 Gross per 24 hour  Intake 3699.43 ml  Output 1710 ml  Net 1989.43 ml   Filed Weights   03/10/18 0200 03/11/18 0600  Weight: 90.7 kg 95.3 kg    Telemetry    NSR - Personally Reviewed  ECG    NSR ST elevation in inferior leads resolved. - Personally Reviewed  Physical Exam   GEN: No acute distress.   Neck: No JVD Cardiac: RRR, no murmurs, rubs, or gallops.  Respiratory: Clear to auscultation bilaterally. GI: Soft, nontender, non-distended  MS: No edema; No deformity. Radial band in place. Neuro:  Nonfocal  Psych: Normal affect   Labs    Chemistry Recent Labs  Lab 03/10/18 0131 03/10/18 0609  NA 136 136  K 3.3* 3.7  CL 103 108  CO2  --   19*  GLUCOSE 139* 112*  BUN 15 11  CREATININE 0.70 0.66  CALCIUM  --  8.4*  GFRNONAA  --  >60  GFRAA  --  >60  ANIONGAP  --  9     Hematology Recent Labs  Lab 03/10/18 0126 03/10/18 0131 03/10/18 0609  WBC 8.6  --  10.2  RBC 4.72  --  4.50  HGB 15.4 16.0 14.5  HCT 46.7 47.0 43.6  MCV 98.9  --  96.9  MCH 32.6  --  32.2  MCHC 33.0  --  33.3  RDW 13.2  --  13.2  PLT 166  --  168    Cardiac Enzymes Recent Labs  Lab 03/10/18 0609 03/10/18 0843 03/10/18 1506  TROPONINI 9.92* 13.28* 9.44*    Recent Labs  Lab 03/10/18 0128  TROPIPOC 0.00     BNP Recent Labs  Lab 03/10/18 0609  BNP 29.0     DDimer No results for input(s): DDIMER in the last 168 hours.   Radiology    Dg Chest Portable 1 View  Result Date: 03/10/2018 CLINICAL DATA:  Intermittent chest pain worsening today with dyspnea. EXAM: PORTABLE CHEST 1 VIEW  COMPARISON:  09/25/2017 CXR FINDINGS: Stable cardiomegaly. Minimal aortic atherosclerosis. Interstitial edema is noted. Interval healing previously noted right-sided rib fractures with callus now noted. No pulmonary consolidation, effusion or pneumothorax. Remote fracture deformity of the distal left clavicle and along the course of the coracoclavicular ligament. IMPRESSION: Stable cardiomegaly with aortic atherosclerosis. Mild interstitial edema. Electronically Signed   By: Tollie Ethavid  Kwon M.D.   On: 03/10/2018 01:51    Cardiac Studies   Procedures   Coronary/Graft Acute MI Revascularization  LEFT HEART CATH AND CORONARY ANGIOGRAPHY  Conclusion     Prox RCA to Mid RCA lesion is 10% stenosed.  Dist RCA lesion is 20% stenosed.  Prox Cx to Mid Cx lesion is 80% stenosed.  Mid Cx lesion is 99% stenosed.  A stent was successfully placed.  Post intervention, there is a 0% residual stenosis.  Post intervention, there is a 0% residual stenosis.  The left ventricular systolic function is normal.  LV end diastolic pressure is normal.    Acute ST  segment elevation myocardial infarction secondary to subtotal stenosis of the mid left circumflex coronary artery.  Normal left anterior descending artery.  Mild nonobstructive disease in the RCA with 10% mild luminal irregularity in the mid segment and 20% smooth distal stenosis.  Preserved LV contractility with an ejection fraction of 55% without definitive wall motion abnormalities.  LVEDP 17 mmHg.  Successful PCI to the mid circumflex treated with PTCA and insertion of a 2.5 x 28 mm Xience Sierra DES stent postdilated to 2.75 mm with the tandem 80 and 99% stenosis being reduced to 0%.  RECOMMENDATION: Recommend uninterrupted dual antiplatelet therapy with Aspirin 81mg  daily and Ticagrelor 90mg  twice daily for a minimum of 12 months (ACS - Class I recommendation).  Patient will be started on post MI beta-blocker therapy with possible ACE inhibition depending upon blood pressure.  High potency statin therapy will be initiated.  Smoking cessation is essential.   Echo Study Conclusions  - Left ventricle: The cavity size was normal. Wall thickness was   increased in a pattern of moderate LVH. Systolic function was   normal. The estimated ejection fraction was in the range of 55%   to 60%. Wall motion was normal; there were no regional wall   motion abnormalities. - Left atrium: The atrium was moderately dilated. - Right ventricle: The cavity size was mildly dilated. Wall   thickness was normal.  Patient Profile     47 y.o. male with history of Etoh and tobacco abuse presents with acute inferior STEMI secondary to LCx occlusion.  Assessment & Plan    1. Acute inferior STEMI. S/p DES of LCx. No other obstructive disease. Peak troponin 13.8. LV function looked is normal. On DAPT with ASA and Brilinta. On low dose metoprolol. Will ambulate in halls today. Plan DC later today. 2. Etoh abuse on CIWA protocol. Etoh level 0. No agitation. 3. Tobacco abuse counseled on smoking  cessation. 4. RLE edema- resolved.  5. GERD on PPI.  6. Bipolar disorder.  For questions or updates, please contact [facility] Please consult [email or web address] for contact info under        Signed, Azyria Osmon [place], MD  03/11/2018, 7:21 AM

## 2018-03-11 NOTE — Progress Notes (Signed)
Reviewed discharge instructions with patient and Revonda Standardllison ,CM present as well. We reviewed importance of brilinta.Pt given brilinta card,prescription and form to apply for assistance. Reviewed other meds and cost. Pt able to teach back administration of nitro sl. Dc home. Melodye PedAngela Anaria Kroner,rn

## 2018-03-11 NOTE — Discharge Summary (Signed)
Discharge Summary    Patient ID: Dustin DraftFloyd T Gasper Jr. MRN: [id number]; DOB: 05/07/1971  Admit date: 03/10/2018 Discharge date: 03/11/2018  Primary Care Provider: Patient, No Pcp Per  Primary Cardiologist: Nicki Guadalajarahomas Kelly, MD   Discharge Diagnoses    Principal Problem:   STEMI involving left circumflex coronary artery [facility](HCC) Active Problems:   Tobacco abuse   Alcohol abuse   GERD (gastroesophageal reflux disease)   Depression   Bipolar disorder (HCC)   CAD (coronary artery disease)   Allergies Allergies  Allergen Reactions  . Penicillins Rash    Has patient had a PCN reaction causing immediate rash, facial/tongue/throat swelling, SOB or lightheadedness with hypotension: YES Has patient had a PCN reaction causing severe rash involving mucus membranes or skin necrosis: NO Has patient had a PCN reaction that required hospitalization: NO Has patient had a PCN reaction occurring within the last 10 years: NO If all of the above answers are "NO", then may proceed with Cephalosporin use.  . Tramadol Hcl Hives    Diagnostic Studies/Procedures    03/10/18 Coronary/Graft Acute MI Revascularization  LEFT HEART CATH AND CORONARY ANGIOGRAPHY  Conclusion     Prox RCA to Mid RCA lesion is 10% stenosed.  Dist RCA lesion is 20% stenosed.  Prox Cx to Mid Cx lesion is 80% stenosed.  Mid Cx lesion is 99% stenosed.  A stent was successfully placed.  Post intervention, there is a 0% residual stenosis.  Post intervention, there is a 0% residual stenosis.  The left ventricular systolic function is normal.  LV end diastolic pressure is normal.  Acute ST segment elevation myocardial infarction secondary to subtotal stenosis of the mid left circumflex coronary artery.  Normal left anterior descending artery.  Mild nonobstructive disease in the RCA with 10% mild luminal irregularity in the mid segment and 20% smooth distal stenosis.  Preserved LV contractility with an ejection  fraction of 55% without definitive wall motion abnormalities. LVEDP 17 mmHg.  Successful PCI to the mid circumflex treated with PTCA and insertion of a 2.5 x 28 mm Xience Sierra DES stent postdilated to 2.75 mm with the tandem 80 and 99% stenosis being reduced to 0%.  RECOMMENDATION: Recommend uninterrupted dual antiplatelet therapy with Aspirin 81mg  daily and Ticagrelor 90mg  twice dailyfor a minimum of 12 months (ACS - Class I recommendation). Patient will be started on post MI beta-blocker therapy with possible ACE inhibition depending upon blood pressure. High potency statin therapy will be initiated. Smoking cessation is essential.   _____________   Echo 03/10/18 Study Conclusions - Left ventricle: The cavity size was normal. Wall thickness was increased in a pattern of moderate LVH. Systolic function was normal. The estimated ejection fraction was in the range of 55% to 60%. Wall motion was normal; there were no regional wall motion abnormalities. - Left atrium: The atrium was moderately dilated. - Right ventricle: The cavity size was mildly dilated. Wall thickness was normal.     History of Present Illness     Dustin DraftFloyd T Roher Jr. is a 47 y.o. male with a history of Etoh abuse, tobacco abuse, GERD, and bipolar disorder who [facility] with chest pain and found to have an acute STEMI.  Of note, he does have a history of alcohol abuse complicated by prior withdrawals. He has had prior ED evaluations during which time he was acutely intoxicated. He notes that he has been drinking less recently and experienced withdrawal last week. He had one beer at lunch time the  day before admission. He also reported that he used CBD oil for the first time that day.   The patient has no cardiac history, although he has been evaluated by cardiology previously for chest discomfort; he refused recommended stress test. He described an unspecified history of chest pain for  at least several months. He states that the evening of admission he noticed swelling in his R foot and a rash on his leg and hip. He then had episode of diarrhea at which time he developed chest pain with associated dyspnea. He called 911 and was evaluated by EMS and was found on ECG to have ST elevations in inferior leads with reciprocal depressions. He was given ASA and zofran and NTG and brought to the ED.    Hospital Course     Consultants: none.   Acute inferior STEMI: the patient was brought directly to the cath lab via the ED. He underwent LHC that showed thrombotic lesion in mid LCx for which he underwent PCI with DES. He was loaded with ticagrelor in the cath lab. Peak troponin 13.8. Continue DAPT with ASA and Brilinta. Continue low dose metoprolol. Plan for discharge today with TOC follow up in office. Etoh abuse on CIWA protocol. Etoh level 0. No agitation.  Tobacco abuse: counseled on smoking cessation.  RLE edema: resolved.   GERD: continue on PPI.   Bipolar disorder: on no meds for this.    _____________  Discharge Vitals Blood pressure 121/90, pulse 71, temperature 98.2 F (36.8 C), temperature source Oral, resp. rate 18, height 5\' 11"  (1.803 m), weight 95.3 kg, SpO2 95 %.  Filed Weights   03/10/18 0200 03/11/18 0600  Weight: 90.7 kg 95.3 kg    Labs & Radiologic Studies    CBC Recent Labs    03/10/18 0126 03/10/18 0131 03/10/18 0609  WBC 8.6  --  10.2  NEUTROABS 5.8  --   --   HGB 15.4 16.0 14.5  HCT 46.7 47.0 43.6  MCV 98.9  --  96.9  PLT 166  --  168   Basic Metabolic Panel Recent Labs    16/10/96 0131 03/10/18 0609  NA 136 136  K 3.3* 3.7  CL 103 108  CO2  --  19*  GLUCOSE 139* 112*  BUN 15 11  CREATININE 0.70 0.66  CALCIUM  --  8.4*  MG  --  1.7  PHOS  --  3.7   Liver Function Tests No results for input(s): AST, ALT, ALKPHOS, BILITOT, PROT, ALBUMIN in the last 72 hours. No results for input(s): LIPASE, AMYLASE in the last 72  hours. Cardiac Enzymes Recent Labs    03/10/18 0609 03/10/18 0843 03/10/18 1506  TROPONINI 9.92* 13.28* 9.44*   BNP Invalid input(s): POCBNP D-Dimer No results for input(s): DDIMER in the last 72 hours. Hemoglobin A1C Recent Labs    03/10/18 0609  HGBA1C 5.4   Fasting Lipid Panel Recent Labs    03/10/18 0609  CHOL 149  HDL 49  LDLCALC 83  TRIG 84  CHOLHDL 3.0   Thyroid Function Tests Recent Labs    03/10/18 0609  TSH 0.862   _____________  Dg Chest Portable 1 View  Result Date: 03/10/2018 CLINICAL DATA:  Intermittent chest pain worsening today with dyspnea. EXAM: PORTABLE CHEST 1 VIEW COMPARISON:  09/25/2017 CXR FINDINGS: Stable cardiomegaly. Minimal aortic atherosclerosis. Interstitial edema is noted. Interval healing previously noted right-sided rib fractures with callus now noted. No pulmonary consolidation, effusion or pneumothorax. Remote fracture deformity of the  distal left clavicle and along the course of the coracoclavicular ligament. IMPRESSION: Stable cardiomegaly with aortic atherosclerosis. Mild interstitial edema. Electronically Signed   By: Tollie Eth M.D.   On: 03/10/2018 01:51   Disposition   Pt is being discharged home today in good condition.  Follow-up Plans & Appointments    Follow-up Information    Lennette Bihari, MD Follow up.   Specialty:  Cardiology Why:  The office will call you to arrange a 1-2 week follow up with one of Dr. Landry Dyke NPs or PAs. If you do not hear from the office please contact them  Contact information: [address] [place] [address] [phone]          Discharge Instructions    AMB Referral to Cardiac Rehabilitation - Phase II   Complete by:  As directed    Diagnosis:  STEMI      Discharge Medications   Allergies as of 03/11/2018      Reactions   Penicillins Rash   Has patient had a PCN reaction causing immediate rash, facial/tongue/throat swelling, SOB or lightheadedness with  hypotension: YES Has patient had a PCN reaction causing severe rash involving mucus membranes or skin necrosis: NO Has patient had a PCN reaction that required hospitalization: NO Has patient had a PCN reaction occurring within the last 10 years: NO If all of the above answers are "NO", then may proceed with Cephalosporin use.   Tramadol Hcl Hives      Medication List    STOP taking these medications   ibuprofen 400 MG tablet Commonly known as:  ADVIL,MOTRIN     TAKE these medications   aspirin 81 MG chewable tablet Chew 1 tablet (81 mg total) by mouth daily.   atorvastatin 80 MG tablet Commonly known as:  LIPITOR Take 1 tablet (80 mg total) by mouth daily at 6 PM.   bacitracin ointment Apply 1 application topically 2 (two) times daily.   metoprolol tartrate 25 MG tablet Commonly known as:  LOPRESSOR Take 0.5 tablets (12.5 mg total) by mouth 2 (two) times daily.   nitroGLYCERIN 0.4 MG SL tablet Commonly known as:  NITROSTAT Place 1 tablet (0.4 mg total) under the tongue every 5 (five) minutes x 3 doses as needed for chest pain.   omeprazole 20 MG capsule Commonly known as:  PRILOSEC Take 1 capsule (20 mg total) by mouth daily. What changed:    when to take this  reasons to take this   ticagrelor 90 MG Tabs tablet Commonly known as:  BRILINTA Take 1 tablet (90 mg total) by mouth 2 (two) times daily.        Acute coronary syndrome (MI, NSTEMI, STEMI, etc) this admission?: Yes.     AHA/ACC Clinical Performance & Quality Measures: 1. Aspirin prescribed? - Yes 2. ADP Receptor Inhibitor (Plavix/Clopidogrel, Brilinta/Ticagrelor or Effient/Prasugrel) prescribed (includes medically managed patients)? - Yes 3. Beta Blocker prescribed? - Yes 4. High Intensity Statin (Lipitor 40-80mg  or Crestor 20-40mg ) prescribed? - Yes 5. EF assessed during THIS hospitalization? - Yes 6. For EF <40%, was ACEI/ARB prescribed? - Not Applicable (EF >/= 40%) 7. For EF <40%, Aldosterone  Antagonist (Spironolactone or Eplerenone) prescribed? - Not Applicable (EF >/= 40%) 8. Cardiac Rehab Phase II ordered (Included Medically managed Patients)? - Yes     Outstanding Labs/Studies   none  Duration of Discharge Encounter   Greater than 30 minutes including physician time.  Signed, Cline Crock, PA-C 03/11/2018, 9:19 AM

## 2018-03-12 LAB — POCT ACTIVATED CLOTTING TIME: Activated Clotting Time: 543 seconds

## 2018-03-12 MED FILL — Heparin Sodium (Porcine) Inj 1000 Unit/ML: INTRAMUSCULAR | Qty: 10 | Status: AC

## 2018-03-13 ENCOUNTER — Telehealth (HOSPITAL_COMMUNITY): Payer: Self-pay

## 2018-03-13 NOTE — Telephone Encounter (Signed)
Called patient to verify if he had any insurance, pt stated not at this time. I did adv pt we do have a maintenance program and he stated he is not working at the time.  Closed referral

## 2018-03-14 ENCOUNTER — Telehealth: Payer: Self-pay | Admitting: *Deleted

## 2018-03-14 NOTE — Telephone Encounter (Signed)
Left message for patient to call and schedule TOC appointment in 1-2 weeks

## 2018-03-20 ENCOUNTER — Other Ambulatory Visit: Payer: Self-pay

## 2018-03-20 ENCOUNTER — Ambulatory Visit: Payer: Self-pay | Attending: Family Medicine | Admitting: Family Medicine

## 2018-03-20 VITALS — BP 124/86 | HR 94 | Temp 98.1°F | Resp 17 | Ht 68.0 in | Wt 209.0 lb

## 2018-03-20 DIAGNOSIS — I25119 Atherosclerotic heart disease of native coronary artery with unspecified angina pectoris: Secondary | ICD-10-CM

## 2018-03-20 DIAGNOSIS — I2121 ST elevation (STEMI) myocardial infarction involving left circumflex coronary artery: Secondary | ICD-10-CM

## 2018-03-20 DIAGNOSIS — Z7689 Persons encountering health services in other specified circumstances: Secondary | ICD-10-CM | POA: Insufficient documentation

## 2018-03-20 DIAGNOSIS — Z79899 Other long term (current) drug therapy: Secondary | ICD-10-CM | POA: Insufficient documentation

## 2018-03-20 DIAGNOSIS — K219 Gastro-esophageal reflux disease without esophagitis: Secondary | ICD-10-CM | POA: Insufficient documentation

## 2018-03-20 DIAGNOSIS — Z833 Family history of diabetes mellitus: Secondary | ICD-10-CM | POA: Insufficient documentation

## 2018-03-20 DIAGNOSIS — F1010 Alcohol abuse, uncomplicated: Secondary | ICD-10-CM | POA: Insufficient documentation

## 2018-03-20 DIAGNOSIS — M25572 Pain in left ankle and joints of left foot: Secondary | ICD-10-CM

## 2018-03-20 DIAGNOSIS — R740 Nonspecific elevation of levels of transaminase and lactic acid dehydrogenase [LDH]: Secondary | ICD-10-CM | POA: Insufficient documentation

## 2018-03-20 DIAGNOSIS — Z8249 Family history of ischemic heart disease and other diseases of the circulatory system: Secondary | ICD-10-CM | POA: Insufficient documentation

## 2018-03-20 DIAGNOSIS — Z09 Encounter for follow-up examination after completed treatment for conditions other than malignant neoplasm: Secondary | ICD-10-CM | POA: Insufficient documentation

## 2018-03-20 DIAGNOSIS — F17200 Nicotine dependence, unspecified, uncomplicated: Secondary | ICD-10-CM

## 2018-03-20 DIAGNOSIS — Z880 Allergy status to penicillin: Secondary | ICD-10-CM | POA: Insufficient documentation

## 2018-03-20 DIAGNOSIS — Z7982 Long term (current) use of aspirin: Secondary | ICD-10-CM | POA: Insufficient documentation

## 2018-03-20 DIAGNOSIS — F319 Bipolar disorder, unspecified: Secondary | ICD-10-CM | POA: Insufficient documentation

## 2018-03-20 DIAGNOSIS — Z7901 Long term (current) use of anticoagulants: Secondary | ICD-10-CM

## 2018-03-20 DIAGNOSIS — F17210 Nicotine dependence, cigarettes, uncomplicated: Secondary | ICD-10-CM | POA: Insufficient documentation

## 2018-03-20 DIAGNOSIS — Z7902 Long term (current) use of antithrombotics/antiplatelets: Secondary | ICD-10-CM | POA: Insufficient documentation

## 2018-03-20 MED ORDER — METOPROLOL TARTRATE 25 MG PO TABS: 12.5000 mg | ORAL_TABLET | Freq: Two times a day (BID) | ORAL | 6 refills | Status: DC

## 2018-03-20 MED ORDER — ATORVASTATIN CALCIUM 80 MG PO TABS: 80.0000 mg | ORAL_TABLET | Freq: Every day | ORAL | 1 refills | Status: DC

## 2018-03-20 MED ORDER — OMEPRAZOLE 20 MG PO CPDR: 20.0000 mg | DELAYED_RELEASE_CAPSULE | Freq: Every day | ORAL | 3 refills | Status: DC

## 2018-03-20 MED ORDER — NITROGLYCERIN 0.4 MG SL SUBL: 0.4000 mg | SUBLINGUAL_TABLET | SUBLINGUAL | 12 refills | Status: DC | PRN

## 2018-03-20 MED ORDER — ASPIRIN 81 MG PO CHEW: 81.0000 mg | CHEWABLE_TABLET | Freq: Every day | ORAL | Status: DC

## 2018-03-20 MED ORDER — TICAGRELOR 90 MG PO TABS: 90.0000 mg | ORAL_TABLET | Freq: Two times a day (BID) | ORAL | 3 refills | Status: DC

## 2018-03-20 MED FILL — BRILINTA 90 MG TABLET: 90 | 30 days supply | Qty: 60 | Fill #0

## 2018-03-20 MED FILL — OMEPRAZOLE 20 MG CAP: 20 | 30 days supply | Qty: 30 | Fill #0

## 2018-03-20 MED FILL — METOPROLOL TARTRATE 25 MG T: 25 | 30 days supply | Qty: 30 | Fill #0

## 2018-03-20 MED FILL — NITROSTAT 0.4 MG TABLET SL: 0.4 | 20 days supply | Qty: 25 | Fill #0

## 2018-03-20 MED FILL — ATORVASTATIN 80 MG TABLET: 80 | 30 days supply | Qty: 30 | Fill #0

## 2018-03-20 NOTE — Progress Notes (Signed)
Dustin Olsen, is a 47 y.o. male  WUJ:[id number]  NFA:[id number]  DOB - 03/10/1971  CC:  Chief Complaint  Patient presents with  . Hospitalization Follow-up    ED->Hosp 9/21-9/22 for STEMI involving L circumflex coronary artery. had L heart cath & coronary angiography on 9/21. hasn't seen Cardiology due to lack of insurance. states that he hasn't had any chest pain but occasionally has SHOB       HPI: Dustin Olsen is a 48 y.o. male is here today to establish care and hospital follow-up.    Chronic health problems include:has Tobacco abuse; Alcohol abuse; Transaminitis; GERD (gastroesophageal reflux disease); Depression; Bipolar disorder [facility]); STEMI involving left circumflex coronary artery (HCC); and CAD (coronary artery disease) on their problem list.   Hospital follow-up Patient suffered STEMI 03/10/2018. Prior to presenting to the [facility] ER, patient reports feeling poorly over the course of several weeks. He has recently attempted stopped abusing alcohol independently and had experienced withdrawal symptoms the 1 week prior to his arrival at the ED. During the night of 03/10/2018, he experienced significant shortness of breath , diarrhea, and right leg edema. He called 911 and upon arrival evaluation with EKG indicated ST elevation in the inferior leads. He was given ASA, Zofran, and Nitroglycerin and was routed emergently to the ER for emergent heart catheterization. He received PCI therapy  to the following mid circumflex to treat 80 and 99% stenosed vessel which was reduced to 0%. Additional blockages include: Mild nonobstructive disease in the RCA with 10% mild luminal irregularity in the mid segment and 20% smooth distal stenosis. Preserved LV contractility with an ejection fraction of 55% without definitive wall motion abnormalities. LVEDP 17 mmHg. Patient placed on dual antiplatelet therapy for minimum of 12 months. BB and ARB onboard.  Statin therapy initiated. LV function normal. ACS  risks factors include: family history of cardiovascular disease, smoking, and alcohol abuse.  He has remained complaint with medications since discharge.  Concerns today: Continues to experience chest pain. One episode of chest pain over 1 week required 2 doses of nitroglycerin. SOB with exertional activity and at rest. No prior work for COPD. Continues to smoke. Reduced from  2 packs per day to 3/4 pack her day.  He has remained alcohol free from over 1 month.   He denies tremors, nausea, vomiting, weakness, or headaches.   Left ankle No known injury. Pain left ankle. No injury.  Occasional swelling. He has not attempted relief with any otc medication or cool applications.   Current medications: Current Outpatient Medications:  .  aspirin 81 MG chewable tablet, Chew 1 tablet (81 mg total) by mouth daily., Disp: , Rfl:  .  atorvastatin (LIPITOR) 80 MG tablet, Take 1 tablet (80 mg total) by mouth daily at 6 PM., Disp: 90 tablet, Rfl: 1 .  metoprolol tartrate (LOPRESSOR) 25 MG tablet, Take 0.5 tablets (12.5 mg total) by mouth 2 (two) times daily., Disp: 30 tablet, Rfl: 6 .  nitroGLYCERIN (NITROSTAT) 0.4 MG SL tablet, Place 1 tablet (0.4 mg total) under the tongue every 5 (five) minutes x 3 doses as needed for chest pain., Disp: 25 tablet, Rfl: 12 .  omeprazole (PRILOSEC) 20 MG capsule, Take 1 capsule (20 mg total) by mouth daily. (Patient taking differently: Take 20 mg by mouth daily as needed (acid reflux/heartburn). ), Disp: 30 capsule, Rfl: 0 .  ticagrelor (BRILINTA) 90 MG TABS tablet, Take 1 tablet (90 mg total) by mouth 2 (two) times daily., Disp: 180 tablet, Rfl: 3  Pertinent family medical history: family history includes Coronary artery disease in his father; Diabetes in his mother; Heart disease in his maternal grandmother and paternal grandmother; Hyperlipidemia in his father; Hypertension in his father.    Concerns related to today's visit:    Patient denies new headaches,  chest pain, abdominal pain, nausea, new weakness , numbness or tingling, SOB, edema, or worrisome cough. .  Allergies  Allergen Reactions  . Penicillins Rash    Has patient had a PCN reaction causing immediate rash, facial/tongue/throat swelling, SOB or lightheadedness with hypotension: YES Has patient had a PCN reaction causing severe rash involving mucus membranes or skin necrosis: NO Has patient had a PCN reaction that required hospitalization: NO Has patient had a PCN reaction occurring within the last 10 years: NO If all of the above answers are "NO", then may proceed with Cephalosporin use.  . Tramadol Hcl Hives    Social History   Socioeconomic History  . Marital status: Single    Spouse name: Not on file  . Number of children: Not on file  . Years of education: Not on file  . Highest education level: Not on file  Occupational History  . Not on file  Social Needs  . Financial resource strain: Not on file  . Food insecurity:    Worry: Not on file    Inability: Not on file  . Transportation needs:    Medical: Not on file    Non-medical: Not on file  Tobacco Use  . Smoking status: Current Every Day Smoker    Packs/day: 0.00  . Smokeless tobacco: Never Used  Substance and Sexual Activity  . Alcohol use: Yes    Alcohol/week: 6.0 standard drinks    Types: 6 Cans of beer per week    Comment: 6 pack per day  . Drug use: Yes    Types: Marijuana  . Sexual activity: Not on file  Lifestyle  . Physical activity:    Days per week: Not on file    Minutes per session: Not on file  . Stress: Not on file  Relationships  . Social connections:    Talks on phone: Not on file    Gets together: Not on file    Attends religious service: Not on file    Active member of club or organization: Not on file    Attends meetings of clubs or organizations: Not on file    Relationship status: Not on file  . Intimate partner violence:    Fear of current or ex partner: Not on file     Emotionally abused: Not on file    Physically abused: Not on file    Forced sexual activity: Not on file  Other Topics Concern  . Not on file  Social History Narrative   ** Merged History Encounter **        Review of Systems: Constitutional: Negative for fever, chills, diaphoresis, activity change, appetite change and fatigue. HENT: Negative for ear pain, nosebleeds, congestion, facial swelling, rhinorrhea, neck pain, neck stiffness and ear discharge.  Eyes: Negative for pain, discharge, redness, itching and visual disturbance. Respiratory: Positive shortness of breath  Cardiovascular: Positive for chest pain and ankle swelling Negative palpitations. Gastrointestinal: Negative for abdominal distention. Genitourinary: Negative for dysuria, urgency, frequency, hematuria, flank pain, decreased urine volume, difficulty urinating and dyspareunia.  Musculoskeletal: Positive left ankle pain. Negativegait problem. Neurological: Negative for dizziness, tremors, seizures, syncope, facial asymmetry, speech difficulty, weakness, light-headedness, numbness and headaches. Hematological: Negative for  adenopathy. Does not bruise/bleed easily. Psychiatric/Behavioral: Negative for hallucinations, behavioral problems, confusion, dysphoric mood, decreased concentration and agitation.  Objective:   Vitals:   03/20/18 1407  BP: 124/86  Pulse: 94  Resp: 17  Temp: 98.1 F (36.7 C)  SpO2: 96%    Physical Exam: Constitutional: Patient appears well-developed and well-nourished. No distress. HENT: Normocephalic, atraumatic, External right and left ear normal. Oropharynx is clear and moist.  Eyes: Conjunctivae and EOM are normal. PERRLA, no scleral icterus. Neck: Normal ROM. Neck supple. No JVD. No tracheal deviation. No thyromegaly. CVS: RRR, S1/S2 +, no murmurs, no gallops, no carotid bruit.  Pulmonary: Diminished breath sound. No stridor, rhonchi, wheezes, rales. No accessory muscle use.  Abdominal:  Soft. BS +, no distension, tenderness, rebound or guarding.  Musculoskeletal: Normal range of motion. No edema and no tenderness.  Neuro: Alert. Normal muscle tone coordination. Skin: Skin is warm and dry. No rash noted. Not diaphoretic. No erythema. No pallor. Psychiatric: Normal mood and affect. Behavior, judgment, thought content normal. Denies suicidal or homicidal ideations. Lab Results  Component Value Date   WBC 10.2 03/10/2018   HGB 14.5 03/10/2018   HCT 43.6 03/10/2018   MCV 96.9 03/10/2018   PLT 168 03/10/2018   Lab Results  Component Value Date   CREATININE 0.66 03/10/2018   BUN 11 03/10/2018   NA 136 03/10/2018   K 3.7 03/10/2018   CL 108 03/10/2018   CO2 19 (L) 03/10/2018    Lab Results  Component Value Date   HGBA1C 5.4 03/10/2018    Lipid Panel     Component Value Date/Time   CHOL 149 03/10/2018 0609   TRIG 84 03/10/2018 0609   HDL 49 03/10/2018 0609   CHOLHDL 3.0 03/10/2018 0609   VLDL 17 03/10/2018 0609   LDLCALC 83 03/10/2018 0609        Assessment and plan:  1. Anticoagulated, stable on DAPT. Will continue for 12 months. Explained the importance of adherence and maintaining a lifestyle free of alcohol. Checking CBC with diff to evaluate H&H&Plt  2. ST elevation myocardial infarction involving left circumflex coronary artery [facility]) with angina, Patient has not followed up with cardiology since discharge from the hospital.  He is encouraged to follow-up immediately.  I am resending a referral to cardiology today.  We will check a CMP to evaluate electrolyte status, twelve-lead EKG to evaluate for any changes compared to most recent EKG taken in the hospital given patient's persistent angina. EKG NSR, negative of ST elevation or depression.   3. Current smoker, not ready for cessation Encouraged to continue to cutback. Explained the negative impact to health. Cessation education provided.  4. Angina concurrent with and due to arteriosclerosis of  coronary artery (HCC) EKG-NSR, negative of ST changes Continue nitroglycerin as needed for chest pain encouraged compliance with all cardiac medications  5. Acute left ankle pain -recommend acetaminophen   Return for follow 1 month, evaluate medication compliance, smoking cessation, and shortness of breath.  Meds ordered this encounter  Medications  . ticagrelor (BRILINTA) 90 MG TABS tablet    Sig: Take 1 tablet (90 mg total) by mouth 2 (two) times daily.    Dispense:  60 tablet    Refill:  3    Patient will pickup when needed.  Marland Kitchen atorvastatin (LIPITOR) 80 MG tablet    Sig: Take 1 tablet (80 mg total) by mouth daily at 6 PM.    Dispense:  90 tablet    Refill:  1  Patient will pick up when ready  . aspirin 81 MG chewable tablet    Sig: Chew 1 tablet (81 mg total) by mouth daily.    Patient will pick up when ready  . omeprazole (PRILOSEC) 20 MG capsule    Sig: Take 1 capsule (20 mg total) by mouth daily.    Dispense:  30 capsule    Refill:  3    Patient will pickup when ready  . metoprolol tartrate (LOPRESSOR) 25 MG tablet    Sig: Take 0.5 tablets (12.5 mg total) by mouth 2 (two) times daily.    Dispense:  30 tablet    Refill:  6    Patient will pickup when needed  . nitroGLYCERIN (NITROSTAT) 0.4 MG SL tablet    Sig: Place 1 tablet (0.4 mg total) under the tongue every 5 (five) minutes x 3 doses as needed for chest pain.    Dispense:  25 tablet    Refill:  12    Patient will pick up when needed.   Orders Placed This Encounter  Procedures  . Comprehensive metabolic panel  . CBC with Differential  . Ambulatory referral to Cardiology    Referral Priority:   Urgent    Referral Type:   Consultation    Referral Reason:   Specialty Services Required    Requested Specialty:   Cardiology    Number of Visits Requested:   3  . EKG 12-Lead    Financial assistance paperwork given to patient to complete.   The patient was given clear instructions to go to ER or return to  medical center if symptoms don't improve, worsen or new problems develop. The patient verbalized understanding. The patient was told to call to get lab results if they haven't heard anything in the next week.     Godfrey Pick. Tiburcio Pea, MSN, [facility]  [address] [place], [place], [place] [address] [phone]     A total of 45  minutes spent, greater than 50 % of this time was spent counseling and coordination of care.   This note has been created with Education officer, environmental. Any transcriptional errors are unintentional.

## 2018-03-20 NOTE — Patient Instructions (Addendum)
For left ankle pain, I recommend that you alternate ice applications and take tylenol 500 mg every 4-6 hours.  Follow-up with Cardiology today [phone].   Coping with Quitting Smoking Quitting smoking is a physical and mental challenge. You will face cravings, withdrawal symptoms, and temptation. Before quitting, work with your health care provider to make a plan that can help you cope. Preparation can help you quit and keep you from giving in. How can I cope with cravings? Cravings usually last for 5-10 minutes. If you get through it, the craving will pass. Consider taking the following actions to help you cope with cravings:  Keep your mouth busy: ? Chew sugar-free gum. ? Suck on hard candies or a straw. ? Brush your teeth.  Keep your hands and body busy: ? Immediately change to a different activity when you feel a craving. ? Squeeze or play with a ball. ? Do an activity or a hobby, like making bead jewelry, practicing needlepoint, or working with wood. ? Mix up your normal routine. ? Take a short exercise break. Go for a quick walk or run up and down stairs. ? Spend time in public places where smoking is not allowed.  Focus on doing something kind or helpful for someone else.  Call a friend or family member to talk during a craving.  Join a support group.  Call a quit line, such as 1-800-QUIT-NOW.  Talk with your health care provider about medicines that might help you cope with cravings and make quitting easier for you.  How can I deal with withdrawal symptoms? Your body may experience negative effects as it tries to get used to not having nicotine in the system. These effects are called withdrawal symptoms. They may include:  Feeling hungrier than normal.  Trouble concentrating.  Irritability.  Trouble sleeping.  Feeling depressed.  Restlessness and agitation.  Craving a cigarette.  To manage withdrawal symptoms:  Avoid places, people, and activities that  trigger your cravings.  Remember why you want to quit.  Get plenty of sleep.  Avoid coffee and other caffeinated drinks. These may worsen some of your symptoms.  How can I handle social situations? Social situations can be difficult when you are quitting smoking, especially in the first few weeks. To manage this, you can:  Avoid parties, bars, and other social situations where people might be smoking.  Avoid alcohol.  Leave right away if you have the urge to smoke.  Explain to your family and friends that you are quitting smoking. Ask for understanding and support.  Plan activities with friends or family where smoking is not an option.  What are some ways I can cope with stress? Wanting to smoke may cause stress, and stress can make you want to smoke. Find ways to manage your stress. Relaxation techniques can help. For example:  Breathe slowly and deeply, in through your nose and out through your mouth.  Listen to soothing, relaxing music.  Talk with a family member or friend about your stress.  Light a candle.  Soak in a bath or take a shower.  Think about a peaceful place.  What are some ways I can prevent weight gain? Be aware that many people gain weight after they quit smoking. However, not everyone does. To keep from gaining weight, have a plan in place before you quit and stick to the plan after you quit. Your plan should include:  Having healthy snacks. When you have a craving, it may help to: ?  Eat plain popcorn, crunchy carrots, celery, or other cut vegetables. ? Chew sugar-free gum.  Changing how you eat: ? Eat small portion sizes at meals. ? Eat 4-6 small meals throughout the day instead of 1-2 large meals a day. ? Be mindful when you eat. Do not watch television or do other things that might distract you as you eat.  Exercising regularly: ? Make time to exercise each day. If you do not have time for a long workout, do short bouts of exercise for 5-10  minutes several times a day. ? Do some form of strengthening exercise, like weight lifting, and some form of aerobic exercise, like running or swimming.  Drinking plenty of water or other low-calorie or no-calorie drinks. Drink 6-8 glasses of water daily, or as much as instructed by your health care provider.  Summary  Quitting smoking is a physical and mental challenge. You will face cravings, withdrawal symptoms, and temptation to smoke again. Preparation can help you as you go through these challenges.  You can cope with cravings by keeping your mouth busy (such as by chewing gum), keeping your body and hands busy, and making calls to family, friends, or a helpline for people who want to quit smoking.  You can cope with withdrawal symptoms by avoiding places where people smoke, avoiding drinks with caffeine, and getting plenty of rest.  Ask your health care provider about the different ways to prevent weight gain, avoid stress, and handle social situations. This information is not intended to replace advice given to you by your health care provider. Make sure you discuss any questions you have with your health care provider. Document Released: 06/03/2016 Document Revised: 06/03/2016 Document Reviewed: 06/03/2016 Elsevier Interactive Patient Education  2018 [employer]  Angina Pectoris Angina pectoris is a very bad feeling in the chest, neck, or arm. Your doctor may call it angina. There are four types of angina. Angina is caused by a lack of blood in the middle and thickest layer of the heart wall (myocardium). Angina may feel like a crushing or squeezing pain in the chest. It may feel like tightness or heavy pressure in the chest. Some people say it feels like gas, heartburn, or indigestion. Some people have symptoms other than pain. These include:  Shortness of breath.  Cold sweats.  Feeling sick to your stomach (nausea).  Feeling light-headed.  Many women have chest discomfort  and some of the other symptoms. However, women often have different symptoms, such as:  Feeling tired (fatigue).  Feeling nervous for no reason.  Feeling weak for no reason.  Dizziness or fainting.  Women may have angina without any symptoms. Follow these instructions at home:  Take medicines only as told by your doctor.  Take care of other health issues as told by your doctor. These include: ? High blood pressure (hypertension). ? Diabetes.  Follow a heart-healthy diet. Your doctor can help you to choose healthy food options and make changes.  Talk to your doctor to learn more about healthy cooking methods and use them. These include: ? Roasting. ? Grilling. ? Broiling. ? Baking. ? Poaching. ? Steaming. ? Stir-frying.  Follow an exercise program approved by your doctor.  Keep a healthy weight. Lose weight as told by your doctor.  Rest when you are tired.  Learn to manage stress.  Do not use any tobacco, such as cigarettes, chewing tobacco, or electronic cigarettes. If you need help quitting, ask your doctor.  If you drink alcohol, and your  doctor says it is okay, limit yourself to no more than 1 drink per day. One drink equals 12 ounces of beer, 5 ounces of wine, or 1 ounces of hard liquor.  Stop illegal drug use.  Keep all follow-up visits as told by your doctor. This is important. Do not take these medicines unless your doctor says that you can:  Nonsteroidal anti-inflammatory drugs (NSAIDs). These include: ? Ibuprofen. ? Naproxen. ? Celecoxib.  Vitamin supplements that have vitamin A, vitamin E, or both.  Hormone therapy that contains estrogen with or without progestin.  Get help right away if:  You have pain in your chest, neck, arm, jaw, stomach, or back that: ? Lasts more than a few minutes. ? Comes back. ? Does not get better after you take medicine under your tongue (sublingual nitroglycerin).  You have any of these symptoms for no  reason: ? Gas, heartburn, or indigestion. ? Sweating a lot. ? Shortness of breath or trouble breathing. ? Feeling sick to your stomach or throwing up. ? Feeling more tired than usual. ? Feeling nervous or worrying more than usual. ? Feeling weak. ? Diarrhea.  You are suddenly dizzy or light-headed.  You faint or pass out. These symptoms may be an emergency. Do not wait to see if the symptoms will go away. Get medical help right away. Call your local emergency services (911 in the [place] Do not drive yourself to the hospital. This information is not intended to replace advice given to you by your health care provider. Make sure you discuss any questions you have with your health care provider. Document Released: 11/23/2007 Document Revised: 11/12/2015 Document Reviewed: 10/08/2013 Elsevier Interactive Patient Education  2017 [employer].

## 2018-03-21 LAB — CBC WITH DIFFERENTIAL/PLATELET
Basophils Absolute: 0.2 10*3/uL (ref 0.0–0.2)
Basos: 1 %
EOS (ABSOLUTE): 0.2 10*3/uL (ref 0.0–0.4)
Eos: 2 %
Hematocrit: 45.9 % (ref 37.5–51.0)
Hemoglobin: 15.3 g/dL (ref 13.0–17.7)
Immature Grans (Abs): 0.1 10*3/uL (ref 0.0–0.1)
Immature Granulocytes: 1 %
Lymphocytes Absolute: 1.7 10*3/uL (ref 0.7–3.1)
Lymphs: 14 %
MCH: 30.2 pg (ref 26.6–33.0)
MCHC: 33.3 g/dL (ref 31.5–35.7)
MCV: 91 fL (ref 79–97)
Monocytes Absolute: 1.1 10*3/uL — ABNORMAL HIGH (ref 0.1–0.9)
Monocytes: 9 %
Neutrophils Absolute: 8.4 10*3/uL — ABNORMAL HIGH (ref 1.4–7.0)
Neutrophils: 73 %
Platelets: 483 10*3/uL — ABNORMAL HIGH (ref 150–450)
RBC: 5.06 x10E6/uL (ref 4.14–5.80)
RDW: 12.5 % (ref 12.3–15.4)
WBC: 11.6 10*3/uL — ABNORMAL HIGH (ref 3.4–10.8)

## 2018-03-21 LAB — COMPREHENSIVE METABOLIC PANEL
ALT: 21 IU/L (ref 0–44)
AST: 12 IU/L (ref 0–40)
Albumin/Globulin Ratio: 1.8 (ref 1.2–2.2)
Albumin: 4.6 g/dL (ref 3.5–5.5)
Alkaline Phosphatase: 81 IU/L (ref 39–117)
BUN/Creatinine Ratio: 15 (ref 9–20)
BUN: 11 mg/dL (ref 6–24)
Bilirubin Total: 0.4 mg/dL (ref 0.0–1.2)
CO2: 22 mmol/L (ref 20–29)
Calcium: 9.6 mg/dL (ref 8.7–10.2)
Chloride: 101 mmol/L (ref 96–106)
Creatinine, Ser: 0.71 mg/dL — ABNORMAL LOW (ref 0.76–1.27)
GFR calc Af Amer: 130 mL/min/{1.73_m2} (ref >59)
GFR calc non Af Amer: 113 mL/min/{1.73_m2} (ref >59)
Globulin, Total: 2.5 g/dL (ref 1.5–4.5)
Glucose: 100 mg/dL — ABNORMAL HIGH (ref 65–99)
Potassium: 4.2 mmol/L (ref 3.5–5.2)
Sodium: 139 mmol/L (ref 134–144)
Total Protein: 7.1 g/dL (ref 6.0–8.5)

## 2018-03-22 NOTE — Progress Notes (Signed)
HPI: Follow-up coronary artery disease.  Patient of Dr. Landry Dyke.  Added for recurrent chest pain.  Recently admitted with acute myocardial infarction.  Cardiac catheterization revealed an 80% mid circumflex followed by 99% lesion.  Patient had PCI with a drug-eluting stent.  LV function was normal.  Echocardiogram showed normal LV function, moderate left atrial enlargement and mild right ventricular enlargement.  In reviewing records there is a CT scan from January 2019 showed a 2.2 cm lesion in the left kidney suspicious for neoplasm.  Dedicated abdominal MRI was recommended.  Since discharge he had mild dyspnea predominately in the evenings for 2 weeks following discharge.  This has now improved.  He has mild chest discomfort that he attributes to indigestion/reflux.  He has had this intermittently in the past.  He has had no chest pain similar to his infarct pain.  No syncope.  Current Outpatient Medications  Medication Sig Dispense Refill  . aspirin 81 MG chewable tablet Chew 1 tablet (81 mg total) by mouth daily.    Marland Kitchen atorvastatin (LIPITOR) 80 MG tablet Take 1 tablet (80 mg total) by mouth daily at 6 PM. 90 tablet 1  . metoprolol tartrate (LOPRESSOR) 25 MG tablet Take 0.5 tablets (12.5 mg total) by mouth 2 (two) times daily. 30 tablet 6  . nitroGLYCERIN (NITROSTAT) 0.4 MG SL tablet Place 1 tablet (0.4 mg total) under the tongue every 5 (five) minutes x 3 doses as needed for chest pain. 25 tablet 12  . omeprazole (PRILOSEC) 20 MG capsule Take 1 capsule (20 mg total) by mouth daily. 30 capsule 3  . ticagrelor (BRILINTA) 90 MG TABS tablet Take 1 tablet (90 mg total) by mouth 2 (two) times daily. 60 tablet 3   No current facility-administered medications for this visit.      Past Medical History:  Diagnosis Date  . Anxiety   . Bipolar disorder (HCC)   . CAD (coronary artery disease)    a. 03/11/18: acute STEMI s/p DES to LCx   . Depression   . Dyspnea   . GERD (gastroesophageal  reflux disease)     Past Surgical History:  Procedure Laterality Date  . CORONARY/GRAFT ACUTE MI REVASCULARIZATION N/A 03/10/2018   Procedure: Coronary/Graft Acute MI Revascularization;  Surgeon: Lennette Bihari, MD;  [facility] INVAS[facility] CV LAB;  Service: Cardiovascular;  Laterality: N/A;  . FOOT SURGERY    . LEFT HEART CATH AND CORONARY ANGIOGRAPHY N/A 03/10/2018   Procedure: LEFT HEART CATH AND CORONARY ANGIOGRAPHY;  Surgeon: Lennette Bihari, MD;  Location: [facility] INVASIVE CV LAB;  Service: Cardiovascular;  Laterality: N/A;  . NO PAST SURGERIES      Social History   Socioeconomic History  . Marital status: Single    Spouse name: Not on file  . Number of children: Not on file  . Years of education: Not on file  . Highest education level: Not on file  Occupational History  . Not on file  Social Needs  . Financial resource strain: Not on file  . Food insecurity:    Worry: Not on file    Inability: Not on file  . Transportation needs:    Medical: Not on file    Non-medical: Not on file  Tobacco Use  . Smoking status: Current Every Day Smoker    Packs/day: 0.00  . Smokeless tobacco: Never Used  Substance and Sexual Activity  . Alcohol use: Yes    Alcohol/week: 6.0 standard drinks    Types:  6 Cans of beer per week    Comment: 6 pack per day  . Drug use: Yes    Types: Marijuana  . Sexual activity: Not on file  Lifestyle  . Physical activity:    Days per week: Not on file    Minutes per session: Not on file  . Stress: Not on file  Relationships  . Social connections:    Talks on phone: Not on file    Gets together: Not on file    Attends religious service: Not on file    Active member of club or organization: Not on file    Attends meetings of clubs or organizations: Not on file    Relationship status: Not on file  . Intimate partner violence:    Fear of current or ex partner: Not on file    Emotionally abused: Not on file    Physically abused: Not on file    Forced  sexual activity: Not on file  Other Topics Concern  . Not on file  Social History Narrative   ** Merged History Encounter **        Family History  Problem Relation Age of Onset  . Diabetes Mother   . Hyperlipidemia Father   . Hypertension Father   . Coronary artery disease Father        Stents placed  . Heart disease Maternal Grandmother   . Heart disease Paternal Grandmother     ROS: no fevers or chills, productive cough, hemoptysis, dysphasia, odynophagia, melena, hematochezia, dysuria, hematuria, rash, seizure activity, orthopnea, PND, pedal edema, claudication. Remaining systems are negative.  Physical Exam: Well-developed well-nourished in no acute distress.  Skin is warm and dry.  HEENT is normal.  Neck is supple.  Chest is clear to auscultation with normal expansion.  Cardiovascular exam is regular rate and rhythm.  Abdominal exam nontender or distended. No masses palpated. Extremities show no edema. neuro grossly intact  ECG-normal sinus rhythm at a rate of 76, no ST changes.  Personally reviewed  A/P  1 chest pain-patient has had mild indigestion symptoms but no symptoms similar to his infarct pain.  Electrocardiogram shows no ST changes.  No plans for further ischemia evaluation at this time.  2 coronary artery disease-continue aspirin, Brilinta and statin.  3 hyperlipidemia-continue statin.  Check lipids and liver in 4 weeks.  4 alcohol abuse-advised cessation.  5 tobacco abuse-counseled on discontinuing.  6 bipolar disorder  7 renal mass-in reviewing patient's chart he was noted to have a 2.2 cm lesion in the left kidney in January.  Abdominal MRI with and without contrast recommended and  2019we will arrange.  Olga Millers, MD

## 2018-03-23 ENCOUNTER — Ambulatory Visit (INDEPENDENT_AMBULATORY_CARE_PROVIDER_SITE_OTHER): Payer: Self-pay | Admitting: Cardiology

## 2018-03-23 ENCOUNTER — Other Ambulatory Visit: Payer: Self-pay | Admitting: *Deleted

## 2018-03-23 ENCOUNTER — Other Ambulatory Visit: Payer: Self-pay | Admitting: Cardiology

## 2018-03-23 ENCOUNTER — Encounter: Payer: Self-pay | Admitting: Cardiology

## 2018-03-23 VITALS — BP 124/72 | HR 76 | Ht 70.0 in | Wt 210.0 lb

## 2018-03-23 DIAGNOSIS — E7800 Pure hypercholesterolemia, unspecified: Secondary | ICD-10-CM

## 2018-03-23 DIAGNOSIS — N2889 Other specified disorders of kidney and ureter: Secondary | ICD-10-CM

## 2018-03-23 DIAGNOSIS — I2510 Atherosclerotic heart disease of native coronary artery without angina pectoris: Secondary | ICD-10-CM

## 2018-03-23 DIAGNOSIS — R072 Precordial pain: Secondary | ICD-10-CM

## 2018-03-23 NOTE — Patient Instructions (Signed)
Medication Instructions:  NO CHANGE If you need a refill on your cardiac medications before your next appointment, please call your pharmacy.   Lab work: Your physician recommends that you return for lab work in: 4 WEEKS PRIOR TO EATING If you have labs (blood work) drawn today and your tests are completely normal, you will receive your results only by: Marland Kitchen MyChart Message (if you have MyChart) OR . A paper copy in the mail If you have any lab test that is abnormal or we need to change your treatment, we will call you to review the results.  Testing/Procedures: MRI OF THE ABDOMEN W/WO TO EVALUATE KIDNEY LESION  Follow-Up: At [facility], you and your health needs are our priority.  As part of our continuing mission to provide you with exceptional heart care, we have created designated Provider Care Teams.  These Care Teams include your primary Cardiologist (physician) and Advanced Practice Providers (APPs -  Physician Assistants and Nurse Practitioners) who all work together to provide you with the care you need, when you need it. You will need a follow up appointment in 3 months.  Please call our office 2 months in advance to schedule this appointment.  You may see Nicki Guadalajara, MD or one of the following Advanced Practice Providers on your designated Care Team: [place], [place] . Micah Flesher, PA-C

## 2018-04-03 ENCOUNTER — Ambulatory Visit (HOSPITAL_COMMUNITY): Payer: MEDICAID

## 2018-04-09 ENCOUNTER — Other Ambulatory Visit: Payer: Self-pay

## 2018-04-09 ENCOUNTER — Encounter (HOSPITAL_COMMUNITY): Payer: Self-pay

## 2018-04-09 ENCOUNTER — Emergency Department (HOSPITAL_COMMUNITY)
Admission: EM | Admit: 2018-04-09 | Discharge: 2018-04-09 | Disposition: A | Discharge status: Home / Self Care | Payer: Self-pay | Attending: Emergency Medicine | Admitting: Emergency Medicine

## 2018-04-09 ENCOUNTER — Emergency Department (HOSPITAL_COMMUNITY): Payer: Self-pay

## 2018-04-09 DIAGNOSIS — Z7982 Long term (current) use of aspirin: Secondary | ICD-10-CM | POA: Insufficient documentation

## 2018-04-09 DIAGNOSIS — F17210 Nicotine dependence, cigarettes, uncomplicated: Secondary | ICD-10-CM | POA: Insufficient documentation

## 2018-04-09 DIAGNOSIS — Z79899 Other long term (current) drug therapy: Secondary | ICD-10-CM | POA: Insufficient documentation

## 2018-04-09 DIAGNOSIS — R079 Chest pain, unspecified: Secondary | ICD-10-CM

## 2018-04-09 DIAGNOSIS — I2510 Atherosclerotic heart disease of native coronary artery without angina pectoris: Secondary | ICD-10-CM | POA: Insufficient documentation

## 2018-04-09 LAB — BASIC METABOLIC PANEL
Anion gap: 10 (ref 5–15)
BUN: 6 mg/dL (ref 6–20)
CO2: 21 mmol/L — ABNORMAL LOW (ref 22–32)
Calcium: 9.2 mg/dL (ref 8.9–10.3)
Chloride: 106 mmol/L (ref 98–111)
Creatinine, Ser: 0.77 mg/dL (ref 0.61–1.24)
GFR calc Af Amer: >60 mL/min (ref >60)
GFR calc non Af Amer: >60 mL/min (ref >60)
Glucose, Bld: 98 mg/dL (ref 70–99)
Potassium: 4 mmol/L (ref 3.5–5.1)
Sodium: 137 mmol/L (ref 135–145)

## 2018-04-09 LAB — CBC
HCT: 44.9 % (ref 39.0–52.0)
Hemoglobin: 14.8 g/dL (ref 13.0–17.0)
MCH: 31.7 pg (ref 26.0–34.0)
MCHC: 33 g/dL (ref 30.0–36.0)
MCV: 96.1 fL (ref 80.0–100.0)
Platelets: 319 10*3/uL (ref 150–400)
RBC: 4.67 MIL/uL (ref 4.22–5.81)
RDW: 12.8 % (ref 11.5–15.5)
WBC: 8 10*3/uL (ref 4.0–10.5)
nRBC: 0 % (ref 0.0–0.2)

## 2018-04-09 LAB — I-STAT TROPONIN, ED: Troponin i, poc: 0 ng/mL (ref 0.00–0.08)

## 2018-04-09 LAB — TROPONIN I: Troponin I: <0.03 ng/mL (ref <0.03)

## 2018-04-09 MED ORDER — ALBUTEROL SULFATE HFA 108 (90 BASE) MCG/ACT IN AERS: 1.0000 | INHALATION_SPRAY | Freq: Four times a day (QID) | RESPIRATORY_TRACT | 0 refills | Status: DC | PRN

## 2018-04-09 MED ORDER — IPRATROPIUM-ALBUTEROL 0.5-2.5 (3) MG/3ML IN SOLN
3.0000 mL | Freq: Once | RESPIRATORY_TRACT | Status: AC
  Administered 2018-04-09: 3 mL via RESPIRATORY_TRACT
  Filled 2018-04-09: qty 3

## 2018-04-09 NOTE — ED Provider Notes (Signed)
[facility] EMERGENCY DEPARTMENT Provider Note   CSN: [id number] Arrival date & time: 04/09/18  1054     History   Chief Complaint Chief Complaint  Patient presents with  . Chest Pain    HPI Dustin Olsen. is a 47 y.o. male.   Chest Pain   This is a recurrent problem. The current episode started 6 to 12 hours ago. The problem occurs constantly. The problem has been gradually improving. The pain is associated with rest. The pain is present in the substernal region. The pain is moderate. The quality of the pain is described as pressure-like. The pain radiates to the left arm. Associated symptoms include cough (Chronic, unchanged). Pertinent negatives include no abdominal pain, no back pain, no claudication, no diaphoresis, no dizziness, no exertional chest pressure, no fever, no hemoptysis, no leg pain, no lower extremity edema, no malaise/fatigue, no nausea, no near-syncope, no orthopnea, no palpitations, no shortness of breath, no sputum production, no syncope and no vomiting. He has tried nitroglycerin for the symptoms. The treatment provided no relief. Risk factors include male gender and smoking/tobacco exposure.  His past medical history is significant for CAD.  Procedure history is positive for cardiac catheterization.    Past Medical History:  Diagnosis Date  . Anxiety   . Bipolar disorder (HCC)   . CAD (coronary artery disease)    a. 03/11/18: acute STEMI s/p DES to LCx   . Depression   . Dyspnea   . GERD (gastroesophageal reflux disease)     Patient Active Problem List   Diagnosis Date Noted  . CAD (coronary artery disease)   . STEMI involving left circumflex coronary artery (HCC) 03/10/2018  . Depression 05/07/2017  . Bipolar disorder (HCC) 05/07/2017  . Tobacco abuse 05/10/2012  . Alcohol abuse 05/10/2012  . Transaminitis 05/10/2012  . GERD (gastroesophageal reflux disease) 05/10/2012    Past Surgical History:  Procedure Laterality Date    . CORONARY/GRAFT ACUTE MI REVASCULARIZATION N/A 03/10/2018   Procedure: Coronary/Graft Acute MI Revascularization;  Surgeon: Lennette Bihari, MD;  Location: [facility] INVASIVE CV LAB;  Service: Cardiovascular;  Laterality: N/A;  . FOOT SURGERY    . LEFT HEART CATH AND CORONARY ANGIOGRAPHY N/A 03/10/2018   Procedure: LEFT HEART CATH AND CORONARY ANGIOGRAPHY;  Surgeon: Lennette Bihari, MD;  Location: [facility] INVASIVE CV LAB;  Service: Cardiovascular;  Laterality: N/A;  . NO PAST SURGERIES          Home Medications    Prior to Admission medications   Medication Sig Start Date End Date Taking? Authorizing Provider  acetaminophen (TYLENOL) 500 MG tablet Take 1,000 mg by mouth every 6 (six) hours as needed for mild pain.   Yes [provider]  aspirin 81 MG chewable tablet Chew 1 tablet (81 mg total) by mouth daily. 03/20/18  Yes Bing Neighbors, FNP  atorvastatin (LIPITOR) 80 MG tablet Take 1 tablet (80 mg total) by mouth daily at 6 PM. 03/20/18  Yes Bing Neighbors, FNP  metoprolol tartrate (LOPRESSOR) 25 MG tablet Take 0.5 tablets (12.5 mg total) by mouth 2 (two) times daily. 03/20/18  Yes Bing Neighbors, FNP  nitroGLYCERIN (NITROSTAT) 0.4 MG SL tablet Place 1 tablet (0.4 mg total) under the tongue every 5 (five) minutes x 3 doses as needed for chest pain. 03/20/18  Yes Bing Neighbors, FNP  omeprazole (PRILOSEC) 20 MG capsule Take 1 capsule (20 mg total) by mouth daily. 03/20/18  Yes Bing Neighbors, FNP  ticagrelor (BRILINTA) 90 MG TABS tablet Take 1 tablet (90 mg total) by mouth 2 (two) times daily. 03/20/18  Yes Bing Neighbors, FNP  albuterol (PROVENTIL HFA;VENTOLIN HFA) 108 (90 Base) MCG/ACT inhaler Inhale 1-2 puffs into the lungs every 6 (six) hours as needed for wheezing or shortness of breath. 04/09/18   Edilia Ghuman, Winfield Rast, MD    Family History Family History  Problem Relation Age of Onset  . Diabetes Mother   . Hyperlipidemia Father   . Hypertension Father   . Coronary  artery disease Father        Stents placed  . Heart disease Maternal Grandmother   . Heart disease Paternal Grandmother     Social History Social History   Tobacco Use  . Smoking status: Current Every Day Smoker    Packs/day: 1.00    Types: Cigarettes  . Smokeless tobacco: Never Used  Substance Use Topics  . Alcohol use: Yes    Alcohol/week: 6.0 standard drinks    Types: 6 Cans of beer per week    Comment: 6 pack per day  . Drug use: Yes    Types: Marijuana     Allergies   Penicillins and Tramadol hcl   Review of Systems Review of Systems  Constitutional: Negative for diaphoresis, fever and malaise/fatigue.  HENT: Negative for congestion.   Eyes: Negative for discharge.  Respiratory: Positive for cough (Chronic, unchanged). Negative for hemoptysis, sputum production and shortness of breath.   Cardiovascular: Positive for chest pain. Negative for palpitations, orthopnea, claudication, syncope and near-syncope.  Gastrointestinal: Negative for abdominal pain, nausea and vomiting.  Genitourinary: Negative for flank pain.  Musculoskeletal: Negative for back pain.  Skin: Negative for rash.  Allergic/Immunologic: Negative for environmental allergies and food allergies.  Neurological: Negative for dizziness.  Hematological: Does not bruise/bleed easily.  Psychiatric/Behavioral: Negative for agitation, behavioral problems and confusion.     Physical Exam Updated Vital Signs BP (!) 122/109   Pulse 93   Temp 97.6 F (36.4 C) (Oral)   Resp (!) 26   Ht 5\' 10"  (1.778 m)   Wt 95.7 kg   SpO2 95%   BMI 30.27 kg/m   Physical Exam  Constitutional: He appears well-developed and well-nourished.  HENT:  Head: Normocephalic and atraumatic.  Eyes: Conjunctivae are normal.  Neck: Neck supple.  Cardiovascular: Normal rate and regular rhythm.  No murmur heard. Pulmonary/Chest: Effort normal. No respiratory distress. He has wheezes (Scattered, mild. ).  Abdominal: Soft. There  is no tenderness.  Musculoskeletal: He exhibits no edema.       Right lower leg: He exhibits no edema.       Left lower leg: He exhibits no edema.  Neurological: He is alert.  Skin: Skin is warm and dry.  Psychiatric: He has a normal mood and affect.  Nursing note and vitals reviewed.    ED Treatments / Results  Labs (all labs ordered are listed, but only abnormal results are displayed) Labs Reviewed  BASIC METABOLIC PANEL - Abnormal; Notable for the following components:      Result Value   CO2 21 (*)    All other components within normal limits  CBC  TROPONIN I  I-STAT TROPONIN, ED    EKG EKG Interpretation  Date/Time:  Monday April 09 2018 10:59:56 EDT Ventricular Rate:  89 PR Interval:  114 QRS Duration: 94 QT Interval:  368 QTC Calculation: 447 R Axis:   12 Text Interpretation:  Normal sinus rhythm Inferior infarct , age undetermined  No significant change since last tracing Confirmed by Gwyneth Sprout ([id number]) on 04/09/2018 3:51:47 PM   Radiology Dg Chest 2 View  Result Date: 04/09/2018 CLINICAL DATA:  Pt endorses left sided chest pain that began this morning with shob, dizziness. Had MI with stent placement last month. EXAM: CHEST - 2 VIEW COMPARISON:  03/10/2018 FINDINGS: Small coarse perihilar and bibasilar bronchovascular markings as before. No new airspace infiltrate or overt edema. Mild cardiomegaly stable. No effusion.  No pneumothorax. Bilateral old rib fracture deformities. IMPRESSION: No acute cardiopulmonary disease. Electronically Signed   By: Corlis Leak M.D.   On: 04/09/2018 11:52    Procedures Procedures (including critical care time)  Medications Ordered in ED Medications  ipratropium-albuterol (DUONEB) 0.5-2.5 (3) MG/3ML nebulizer solution 3 mL (3 mLs Nebulization Given 04/09/18 1656)     Initial Impression / Assessment and Plan / ED Course  I have reviewed the triage vital signs and the nursing notes.  Pertinent labs & imaging results  that were available during my care of the patient were reviewed by me and considered in my medical decision making (see chart for details).     Patient is a 47 year old male with past medical history as above who presents to the emergency department for evaluation of chest pain.  Chest pain is been ongoing for some time, however did acutely worsen this afternoon.  Patient states this does feel somewhat different than his pain caused by ACS.  Secondary to patient's recent cardiac procedure and his recurrent chest pain, labs and imaging studies were obtained.  Patient's labs are overall reassuring.  Patient's chest x-ray shows no acute findings.  Patient's EKG was reviewed and does not show any significant changes from previous or evidence of acute ischemia.  Due to patient's smoking history and the wheezing heard on lung exam, patient was given a one-time DuoNeb with significant improvement to his symptoms.  Given patient's recent history of ACS status post stent, cardiology was consulted regarding this patient.  After speaking with the provider on call he believes that no further work-up is indicated at this time.  On reevaluation, patient states his symptoms have significantly improved and he has no continued chest pain.  Due to patient's reassuring work-up and improvement of symptoms he is appropriate for discharge at this time.  Patient follow-up with his primary care provider in the next few days.  Patient no acute distress at time of discharge.  At this time, patient's presentation is inconsistent with an acute cardiac cause of his chest pain given his reassuring work-up.  Thrombosis of his recently placed stent was strongly considered given his presentation, however given negative troponin and no EKG changes this is unlikely at this time. Patient with no pleuritic component to his chest pain, no tachycardia, or hypoxia making PE less likely at this time. No evidence of pneumonia on CXR. No recent  trauma or evidence of pneumo/hemothorax on CXR.  Given patient's extensive smoking history and his significant improvement following DuoNeb, this may be a mild exacerbation of patient's underlying chronic lung disease.  The care of this patient was discussed with my attending physician Dr. Anitra Lauth, who voices agreement with work-up and ED disposition.  Final Clinical Impressions(s) / ED Diagnoses   Final diagnoses:  Chest pain, unspecified type    ED Discharge Orders         Ordered    albuterol (PROVENTIL HFA;VENTOLIN HFA) 108 (90 Base) MCG/ACT inhaler  Every 6 hours PRN     04/09/18 1842  Tempest Frankland, Winfield Rast, MD 04/10/18 1406    Gwyneth Sprout, MD 04/10/18 1454

## 2018-04-09 NOTE — ED Notes (Signed)
Patient denies pain and is resting comfortably.  

## 2018-04-09 NOTE — ED Triage Notes (Signed)
Pt endorses left sided chest pain that began this morning with shob, dizziness. Had MI with stent placement last month. VSS

## 2018-04-18 ENCOUNTER — Ambulatory Visit (HOSPITAL_COMMUNITY): Admission: RE | Admit: 2018-04-18 | Payer: Self-pay | Source: Ambulatory Visit

## 2018-05-03 ENCOUNTER — Ambulatory Visit: Payer: Self-pay | Admitting: Family Medicine

## 2018-05-08 MED FILL — ATORVASTATIN 80 MG TABLET: 80 | 30 days supply | Qty: 30 | Fill #1

## 2018-05-08 MED FILL — OMEPRAZOLE 20 MG CAP: 20 | 30 days supply | Qty: 30 | Fill #1

## 2018-05-08 MED FILL — METOPROLOL TARTRATE 25 MG T: 25 | 30 days supply | Qty: 30 | Fill #1

## 2018-05-08 MED FILL — NITROGLYCERIN 0.4 MG TAB SL: 0.4 | 20 days supply | Qty: 25 | Fill #1

## 2018-05-08 MED FILL — BRILINTA 90 MG TABLET: 90 | 30 days supply | Qty: 60 | Fill #1

## 2018-05-16 ENCOUNTER — Emergency Department (HOSPITAL_COMMUNITY)
Admission: EM | Admit: 2018-05-16 | Discharge: 2018-05-17 | Disposition: A | Discharge status: Home / Self Care | Payer: Self-pay | Attending: Emergency Medicine | Admitting: Emergency Medicine

## 2018-05-16 ENCOUNTER — Encounter (HOSPITAL_COMMUNITY): Payer: Self-pay

## 2018-05-16 DIAGNOSIS — F17210 Nicotine dependence, cigarettes, uncomplicated: Secondary | ICD-10-CM | POA: Insufficient documentation

## 2018-05-16 DIAGNOSIS — Z79899 Other long term (current) drug therapy: Secondary | ICD-10-CM | POA: Insufficient documentation

## 2018-05-16 DIAGNOSIS — I2510 Atherosclerotic heart disease of native coronary artery without angina pectoris: Secondary | ICD-10-CM | POA: Insufficient documentation

## 2018-05-16 DIAGNOSIS — F419 Anxiety disorder, unspecified: Secondary | ICD-10-CM | POA: Insufficient documentation

## 2018-05-16 DIAGNOSIS — F319 Bipolar disorder, unspecified: Secondary | ICD-10-CM | POA: Insufficient documentation

## 2018-05-16 DIAGNOSIS — F10121 Alcohol abuse with intoxication delirium: Secondary | ICD-10-CM | POA: Insufficient documentation

## 2018-05-16 DIAGNOSIS — Z7982 Long term (current) use of aspirin: Secondary | ICD-10-CM | POA: Insufficient documentation

## 2018-05-16 NOTE — ED Triage Notes (Signed)
Patient BIB [facility] for altered mental status. Per EMS family called out because patient could not be aroused as normal and was shouting complaining of a headache and tremors. Family states he's been drinking x 4 days and "he normally doesn't act like this". Patient c/o headache x 1 hour. Patient states he's been drinking a lot lately. Patient was combative with EMS who gave 5mg  Midazolam @2305 .   BP 116/70 HR 92 RR 18   CBG 98

## 2018-05-17 ENCOUNTER — Emergency Department (HOSPITAL_COMMUNITY): Payer: Self-pay

## 2018-05-17 LAB — TROPONIN I: Troponin I: <0.03 ng/mL (ref <0.03)

## 2018-05-17 LAB — URINALYSIS, ROUTINE W REFLEX MICROSCOPIC
Bacteria, UA: NONE SEEN
Bilirubin Urine: NEGATIVE
Glucose, UA: NEGATIVE mg/dL
Ketones, ur: NEGATIVE mg/dL
Leukocytes, UA: NEGATIVE
Nitrite: NEGATIVE
Protein, ur: NEGATIVE mg/dL
Specific Gravity, Urine: 1.003 — ABNORMAL LOW (ref 1.005–1.030)
pH: 6 (ref 5.0–8.0)

## 2018-05-17 LAB — BASIC METABOLIC PANEL
Anion gap: 15 (ref 5–15)
BUN: <5 mg/dL — ABNORMAL LOW (ref 6–20)
CO2: 19 mmol/L — ABNORMAL LOW (ref 22–32)
Calcium: 8.7 mg/dL — ABNORMAL LOW (ref 8.9–10.3)
Chloride: 109 mmol/L (ref 98–111)
Creatinine, Ser: 0.73 mg/dL (ref 0.61–1.24)
GFR calc Af Amer: >60 mL/min (ref >60)
GFR calc non Af Amer: >60 mL/min (ref >60)
Glucose, Bld: 94 mg/dL (ref 70–99)
Potassium: 3.7 mmol/L (ref 3.5–5.1)
Sodium: 143 mmol/L (ref 135–145)

## 2018-05-17 LAB — CBC WITH DIFFERENTIAL/PLATELET
Abs Immature Granulocytes: 0.03 10*3/uL (ref 0.00–0.07)
Basophils Absolute: 0.1 10*3/uL (ref 0.0–0.1)
Basophils Relative: 2 %
Eosinophils Absolute: 0.1 10*3/uL (ref 0.0–0.5)
Eosinophils Relative: 2 %
HCT: 46.2 % (ref 39.0–52.0)
Hemoglobin: 15.4 g/dL (ref 13.0–17.0)
Immature Granulocytes: 1 %
Lymphocytes Relative: 39 %
Lymphs Abs: 2.2 10*3/uL (ref 0.7–4.0)
MCH: 31.7 pg (ref 26.0–34.0)
MCHC: 33.3 g/dL (ref 30.0–36.0)
MCV: 95.1 fL (ref 80.0–100.0)
Monocytes Absolute: 0.6 10*3/uL (ref 0.1–1.0)
Monocytes Relative: 10 %
Neutro Abs: 2.7 10*3/uL (ref 1.7–7.7)
Neutrophils Relative %: 46 %
Platelets: 438 10*3/uL — ABNORMAL HIGH (ref 150–400)
RBC: 4.86 MIL/uL (ref 4.22–5.81)
RDW: 14.2 % (ref 11.5–15.5)
WBC: 5.8 10*3/uL (ref 4.0–10.5)
nRBC: 0 % (ref 0.0–0.2)

## 2018-05-17 LAB — HEPATIC FUNCTION PANEL
ALT: 98 U/L — ABNORMAL HIGH (ref 0–44)
AST: 72 U/L — ABNORMAL HIGH (ref 15–41)
Albumin: 4.4 g/dL (ref 3.5–5.0)
Alkaline Phosphatase: 70 U/L (ref 38–126)
Bilirubin, Direct: 0.2 mg/dL (ref 0.0–0.2)
Indirect Bilirubin: 0.4 mg/dL (ref 0.3–0.9)
Total Bilirubin: 0.6 mg/dL (ref 0.3–1.2)
Total Protein: 7.4 g/dL (ref 6.5–8.1)

## 2018-05-17 LAB — RAPID URINE DRUG SCREEN, HOSP PERFORMED
Amphetamines: NOT DETECTED
Barbiturates: NOT DETECTED
Benzodiazepines: POSITIVE — AB
Cocaine: NOT DETECTED
Opiates: NOT DETECTED
Tetrahydrocannabinol: NOT DETECTED

## 2018-05-17 LAB — ETHANOL: Alcohol, Ethyl (B): 363 mg/dL (ref <10)

## 2018-05-17 LAB — MAGNESIUM: Magnesium: 2.4 mg/dL (ref 1.7–2.4)

## 2018-05-17 MED ORDER — VITAMIN B-1 100 MG PO TABS
100.0000 mg | ORAL_TABLET | Freq: Once | ORAL | Status: AC
  Administered 2018-05-17: 100 mg via ORAL
  Filled 2018-05-17: qty 1

## 2018-05-17 MED ORDER — ACETAMINOPHEN 325 MG PO TABS
650.0000 mg | ORAL_TABLET | Freq: Once | ORAL | Status: AC
  Administered 2018-05-17: 650 mg via ORAL
  Filled 2018-05-17: qty 2

## 2018-05-17 NOTE — ED Notes (Signed)
Patient's family called out stating the patient is "shaking again like he was at home and is not breathing". Patient's VSS and within normal limits. No tremors noted at this time. MD made aware.

## 2018-05-17 NOTE — ED Provider Notes (Signed)
[facility] EMERGENCY DEPARTMENT Provider Note   CSN: [id number] Arrival date & time: 05/16/18  2328     History   Chief Complaint Chief Complaint  Patient presents with  . Altered Mental Status    HPI Dustin Mall. is a 47 y.o. male.  HPI  47 year old male with history of coronary artery disease, bipolar disorder, anxiety comes in with chief complaint of altered mental status.  Patient also admits to heavy alcohol use.  According to EMS, patient's family called 911 because patient was intoxicated and acting differently.  EMS had to give patient Versed because he was combative.  Patient is now calm and is cooperative with the history.  He reports that he has been drinking heavily for the past few days.  He thinks that his brother does not like him and therefore called EMS.  He was making EMS to not bring him to the hospital which is why they thought he was combative.  Patient is also complaining of headache.  Headache is been present for at least 4 days.  Patient has history of similar type of headaches because he reports multiple falls in the past.  Not taken any medication for this headache.  He denies any associated numbness, tingling, dizziness, vision changes, neck pain.  Past Medical History:  Diagnosis Date  . Anxiety   . Bipolar disorder (HCC)   . CAD (coronary artery disease)    a. 03/11/18: acute STEMI s/p DES to LCx   . Depression   . Dyspnea   . GERD (gastroesophageal reflux disease)     Patient Active Problem List   Diagnosis Date Noted  . CAD (coronary artery disease)   . STEMI involving left circumflex coronary artery (HCC) 03/10/2018  . Depression 05/07/2017  . Bipolar disorder (HCC) 05/07/2017  . Tobacco abuse 05/10/2012  . Alcohol abuse 05/10/2012  . Transaminitis 05/10/2012  . GERD (gastroesophageal reflux disease) 05/10/2012    Past Surgical History:  Procedure Laterality Date  . CORONARY/GRAFT ACUTE MI REVASCULARIZATION  N/A 03/10/2018   Procedure: Coronary/Graft Acute MI Revascularization;  Surgeon: Lennette Bihari, MD;  Location: [facility] INVASIVE CV LAB;  Service: Cardiovascular;  Laterality: N/A;  . FOOT SURGERY    . LEFT HEART CATH AND CORONARY ANGIOGRAPHY N/A 03/10/2018   Procedure: LEFT HEART CATH AND CORONARY ANGIOGRAPHY;  Surgeon: Lennette Bihari, MD;  Location: [facility] INVASIVE CV LAB;  Service: Cardiovascular;  Laterality: N/A;  . NO PAST SURGERIES          Home Medications    Prior to Admission medications   Medication Sig Start Date End Date Taking? Authorizing Provider  acetaminophen (TYLENOL) 500 MG tablet Take 1,000 mg by mouth every 6 (six) hours as needed for mild pain.    [provider]  albuterol (PROVENTIL HFA;VENTOLIN HFA) 108 (90 Base) MCG/ACT inhaler Inhale 1-2 puffs into the lungs every 6 (six) hours as needed for wheezing or shortness of breath. 04/09/18   Rozier, Winfield Rast, MD  aspirin 81 MG chewable tablet Chew 1 tablet (81 mg total) by mouth daily. 03/20/18   Bing Neighbors, FNP  atorvastatin (LIPITOR) 80 MG tablet Take 1 tablet (80 mg total) by mouth daily at 6 PM. 03/20/18   Bing Neighbors, FNP  metoprolol tartrate (LOPRESSOR) 25 MG tablet Take 0.5 tablets (12.5 mg total) by mouth 2 (two) times daily. 03/20/18   Bing Neighbors, FNP  nitroGLYCERIN (NITROSTAT) 0.4 MG SL tablet Place 1 tablet (0.4 mg total)  under the tongue every 5 (five) minutes x 3 doses as needed for chest pain. 03/20/18   Bing Neighbors, FNP  omeprazole (PRILOSEC) 20 MG capsule Take 1 capsule (20 mg total) by mouth daily. 03/20/18   Bing Neighbors, FNP  ticagrelor (BRILINTA) 90 MG TABS tablet Take 1 tablet (90 mg total) by mouth 2 (two) times daily. 03/20/18   Bing Neighbors, FNP    Family History Family History  Problem Relation Age of Onset  . Diabetes Mother   . Hyperlipidemia Father   . Hypertension Father   . Coronary artery disease Father        Stents placed  . Heart disease  Maternal Grandmother   . Heart disease Paternal Grandmother     Social History Social History   Tobacco Use  . Smoking status: Current Every Day Smoker    Packs/day: 1.00    Types: Cigarettes  . Smokeless tobacco: Never Used  Substance Use Topics  . Alcohol use: Yes    Alcohol/week: 6.0 standard drinks    Types: 6 Cans of beer per week    Comment: 6 pack per day  . Drug use: Yes    Types: Marijuana     Allergies   Penicillins and Tramadol hcl   Review of Systems Review of Systems  Respiratory: Negative for shortness of breath.   Cardiovascular: Negative for chest pain.  Gastrointestinal: Negative for abdominal pain, nausea and vomiting.  Neurological: Positive for headaches. Negative for dizziness, weakness, light-headedness and numbness.  All other systems reviewed and are negative.    Physical Exam Updated Vital Signs BP 119/72   Pulse 89   Temp 98.2 F (36.8 C) (Oral)   Resp 15   Ht 5\' 11"  (1.803 m)   Wt 95.3 kg   SpO2 97%   BMI 29.29 kg/m   Physical Exam  Constitutional: He is oriented to person, place, and time. He appears well-developed.  HENT:  Head: Atraumatic.  Eyes: Pupils are equal, round, and reactive to light. EOM are normal.  Neck: Neck supple.  Cardiovascular: Normal rate.  Pulmonary/Chest: Effort normal.  Neurological: He is alert and oriented to person, place, and time.  Cerebellar exam is normal (finger to nose) Sensory exam normal for bilateral upper and lower extremities - and patient is able to discriminate between sharp and dull. Motor exam is 4+/5   Skin: Skin is warm.  Nursing note and vitals reviewed.    ED Treatments / Results  Labs (all labs ordered are listed, but only abnormal results are displayed) Labs Reviewed  CBC WITH DIFFERENTIAL/PLATELET - Abnormal; Notable for the following components:      Result Value   Platelets 438 (*)    All other components within normal limits  BASIC METABOLIC PANEL - Abnormal;  Notable for the following components:   CO2 19 (*)    BUN <5 (*)    Calcium 8.7 (*)    All other components within normal limits  ETHANOL - Abnormal; Notable for the following components:   Alcohol, Ethyl (B) 363 (*)    All other components within normal limits  RAPID URINE DRUG SCREEN, HOSP PERFORMED - Abnormal; Notable for the following components:   Benzodiazepines POSITIVE (*)    All other components within normal limits  URINALYSIS, ROUTINE W REFLEX MICROSCOPIC - Abnormal; Notable for the following components:   Color, Urine STRAW (*)    Specific Gravity, Urine 1.003 (*)    Hgb urine dipstick MODERATE (*)  All other components within normal limits  HEPATIC FUNCTION PANEL - Abnormal; Notable for the following components:   AST 72 (*)    ALT 98 (*)    All other components within normal limits  MAGNESIUM  TROPONIN I    EKG EKG Interpretation  Date/Time:  Thursday May 17 2018 00:41:23 EST Ventricular Rate:  91 PR Interval:    QRS Duration: 106 QT Interval:  393 QTC Calculation: 484 R Axis:   13 Text Interpretation:  Sinus rhythm Abnormal R-wave progression, early transition Borderline prolonged QT interval No acute changes Confirmed by Derwood Kaplan ([id number]) on 05/17/2018 12:47:34 AM   Radiology Dg Chest 2 View  Result Date: 05/17/2018 CLINICAL DATA:  DIB. Altered mental status. EXAM: CHEST - 2 VIEW COMPARISON:  04/09/2018 FINDINGS: Cardiomegaly with normal mediastinal contours the lungs are clear. Pulmonary vasculature is normal. No consolidation, pleural effusion, or pneumothorax. Remote bilateral rib fractures. Remote distal left clavicle fracture. IMPRESSION: Cardiomegaly without acute abnormality. Electronically Signed   By: Narda Rutherford M.D.   On: 05/17/2018 01:40   Ct Head Wo Contrast  Result Date: 05/17/2018 CLINICAL DATA:  New nontraumatic seizure. EXAM: CT HEAD WITHOUT CONTRAST TECHNIQUE: Contiguous axial images were obtained from the base of the  skull through the vertex without intravenous contrast. COMPARISON:  09/10/2017 FINDINGS: Brain: No evidence of acute infarction, hemorrhage, hydrocephalus, extra-axial collection, or mass lesion/mass effect. Stable mild diffuse cerebral and cerebellar atrophy. Vascular:  No hyperdense vessel or other acute findings. Skull: No evidence of fracture or other significant bone abnormality. Sinuses/Orbits:  No acute findings. Other: None. IMPRESSION: No acute intracranial abnormality. Mild diffuse cerebral and cerebral atrophy. Electronically Signed   By: Myles Rosenthal M.D.   On: 05/17/2018 01:51    Procedures Procedures (including critical care time)  Medications Ordered in ED Medications  acetaminophen (TYLENOL) tablet 650 mg (650 mg Oral Given 05/17/18 0037)  thiamine (VITAMIN B-1) tablet 100 mg (100 mg Oral Given 05/17/18 0115)     Initial Impression / Assessment and Plan / ED Course  I have reviewed the triage vital signs and the nursing notes.  Pertinent labs & imaging results that were available during my care of the patient were reviewed by me and considered in my medical decision making (see chart for details).  Clinical Course as of May 17 218  Thu May 17, 2018  0217 Blood alcohol level is significantly high. Results were discussed with the patient and the family.  Patient wants to go home now.  LFTs are within normal limits.  I discussed with the patient and the family that with his alcohol level being 360, he does not have the best ability to make decisions -and that the family has to agree to him going home and taking him home.  Family has now agreed to take the patient home with them.  Strict ER return precautions have been discussed with the patient.  Alcohol, Ethyl (B)(!!): 363 [AN]    Clinical Course User Index [AN] Derwood Kaplan, MD    47 year old male brought into the ER with chief complaint of altered mental status and headaches.  He has history of alcoholism along with  some mental illness.  Patient is alert and cooperative for [place].  He does appear to be intoxicated.  With his headache there is no focal neurologic deficits.  Patient has history of similar headaches in the past.  Basic labs have been ordered.  We will give him some IV fluids and reassess for sobriety.  Final Clinical Impressions(s) / ED Diagnoses   Final diagnoses:  Alcohol intoxication delirium [facility](HCC)    ED Discharge Orders    None       Derwood KaplanNanavati, Illyana Schorsch, MD 05/17/18 [phone]

## 2018-05-17 NOTE — ED Notes (Signed)
Patient started yelling at staff stating "I want something to eat and drink and I'm going to get something if I want it". Patient asked to please get back in the bed and given water to drink at this time with meds.

## 2018-05-17 NOTE — ED Notes (Signed)
Patient transported to X-ray 

## 2018-05-17 NOTE — ED Notes (Signed)
Patient refused repeat vital check at this time.

## 2018-05-17 NOTE — ED Notes (Signed)
Patient started to yell and shout that he is leaving. MD at bedside. Family agrees to take responsibility for patient and take him home at this time.

## 2018-06-05 MED FILL — NITROGLYCERIN 0.4 MG TAB SL: 0.4 | 20 days supply | Qty: 25 | Fill #2

## 2018-06-05 MED FILL — METOPROLOL TARTRATE 25 MG T: 25 | 30 days supply | Qty: 30 | Fill #2

## 2018-06-05 MED FILL — OMEPRAZOLE 20 MG CAP: 20 | 30 days supply | Qty: 30 | Fill #2

## 2018-06-05 MED FILL — !BRILINTA 90 MG TABLET: 30 days supply | Qty: 60 | Fill #2

## 2018-06-05 MED FILL — ATORVASTATIN 80 MG TABLET: 80 | 30 days supply | Qty: 30 | Fill #2

## 2018-07-02 ENCOUNTER — Ambulatory Visit: Payer: Self-pay | Admitting: Cardiology

## 2018-07-03 MED FILL — ATORVASTATIN 80 MG TABLET: 80 | 30 days supply | Qty: 30 | Fill #3

## 2018-07-03 MED FILL — METOPROLOL TARTRATE 25 MG T: 25 | 30 days supply | Qty: 30 | Fill #3

## 2018-07-03 MED FILL — BRILINTA 90 MG TABLET: 90 | 30 days supply | Qty: 60 | Fill #3

## 2018-07-03 MED FILL — OMEPRAZOLE 20 MG CAP: 20 | 30 days supply | Qty: 30 | Fill #3

## 2018-07-03 MED FILL — NITROGLYCERIN 0.4 MG TAB SL: 0.4 | 20 days supply | Qty: 25 | Fill #3

## 2018-07-06 ENCOUNTER — Encounter: Payer: Self-pay | Admitting: Cardiology

## 2018-08-01 ENCOUNTER — Other Ambulatory Visit: Payer: Self-pay | Admitting: Family Medicine

## 2018-08-01 ENCOUNTER — Ambulatory Visit: Payer: Self-pay | Attending: Family Medicine

## 2018-08-01 MED FILL — !BRILINTA 90 MG TABLET: 7 days supply | Qty: 14 | Fill #0

## 2018-08-01 MED FILL — METOPROLOL TARTRATE 25 MG T: 25 | 30 days supply | Qty: 30 | Fill #4

## 2018-08-01 MED FILL — ATORVASTATIN 80 MG TABLET: 80 | 30 days supply | Qty: 30 | Fill #4

## 2018-08-01 MED FILL — OMEPRAZOLE 20 MG CAP: 20 | 30 days supply | Qty: 30 | Fill #0

## 2018-08-01 NOTE — Telephone Encounter (Signed)
Patient will need an appt for additional medication. Call office to schedule. Given 1 week of medication

## 2018-08-01 NOTE — Telephone Encounter (Signed)
Rx refill request- sent for provider review 

## 2018-08-07 ENCOUNTER — Inpatient Hospital Stay: Payer: Self-pay | Admitting: Family Medicine

## 2018-08-07 ENCOUNTER — Telehealth: Payer: Self-pay | Admitting: Family Medicine

## 2018-08-07 NOTE — Telephone Encounter (Signed)
Patient has not been seen at this office. Per Ms. Tiburcio Pea' note on the refill encounter on 08/01/18 patient needs an appointment prior to having refills. Is there a reason that he can't come this afternoon?

## 2018-08-07 NOTE — Telephone Encounter (Signed)
Per Ms Tiburcio Pea patient can come at 11:10 on 08/09/2018 or 2:30 tomorrow. She is not doing additional refills. He no-showed an appointment with her as well as Cardiology.

## 2018-08-07 NOTE — Telephone Encounter (Signed)
Patient's mother called requesting a refill of BRILINTA 90 MG TABS tablet [[id number]]  Patient would only like enough until appointment on 08/29/2018. Patient wanted sooner appointment but PCP is booked up until then, please follow up.

## 2018-08-07 NOTE — Telephone Encounter (Signed)
Patient has a disability hearing that he cannot miss.

## 2018-08-09 ENCOUNTER — Encounter: Payer: Self-pay | Admitting: Family Medicine

## 2018-08-09 ENCOUNTER — Ambulatory Visit (INDEPENDENT_AMBULATORY_CARE_PROVIDER_SITE_OTHER): Payer: Self-pay | Admitting: Family Medicine

## 2018-08-09 VITALS — BP 131/86 | HR 89 | Resp 18 | Ht 70.0 in | Wt 225.0 lb

## 2018-08-09 DIAGNOSIS — R0602 Shortness of breath: Secondary | ICD-10-CM

## 2018-08-09 DIAGNOSIS — F17210 Nicotine dependence, cigarettes, uncomplicated: Secondary | ICD-10-CM

## 2018-08-09 DIAGNOSIS — F1010 Alcohol abuse, uncomplicated: Secondary | ICD-10-CM

## 2018-08-09 DIAGNOSIS — R0789 Other chest pain: Secondary | ICD-10-CM

## 2018-08-09 DIAGNOSIS — I25118 Atherosclerotic heart disease of native coronary artery with other forms of angina pectoris: Secondary | ICD-10-CM

## 2018-08-09 DIAGNOSIS — Z720 Tobacco use: Secondary | ICD-10-CM

## 2018-08-09 DIAGNOSIS — Z7901 Long term (current) use of anticoagulants: Secondary | ICD-10-CM

## 2018-08-09 MED ORDER — ATORVASTATIN CALCIUM 80 MG PO TABS: 80.0000 mg | ORAL_TABLET | Freq: Every day | ORAL | 1 refills | Status: DC

## 2018-08-09 MED ORDER — ALBUTEROL SULFATE HFA 108 (90 BASE) MCG/ACT IN AERS: 1.0000 | INHALATION_SPRAY | Freq: Four times a day (QID) | RESPIRATORY_TRACT | 0 refills | Status: DC | PRN

## 2018-08-09 MED ORDER — ASPIRIN 81 MG PO CHEW: 81.0000 mg | CHEWABLE_TABLET | Freq: Every day | ORAL | Status: AC

## 2018-08-09 MED ORDER — METOPROLOL TARTRATE 25 MG PO TABS: 12.5000 mg | ORAL_TABLET | Freq: Two times a day (BID) | ORAL | 6 refills | Status: DC

## 2018-08-09 MED ORDER — OMEPRAZOLE 20 MG PO CPDR: 20.0000 mg | DELAYED_RELEASE_CAPSULE | Freq: Every day | ORAL | 3 refills | Status: DC

## 2018-08-09 MED ORDER — TICAGRELOR 90 MG PO TABS: ORAL_TABLET | ORAL | 3 refills | Status: DC

## 2018-08-09 MED ORDER — NITROGLYCERIN 0.4 MG SL SUBL: 0.4000 mg | SUBLINGUAL_TABLET | SUBLINGUAL | 12 refills | Status: DC | PRN

## 2018-08-09 MED FILL — OMEPRAZOLE 20 MG CAP: 20 | 30 days supply | Qty: 30 | Fill #0

## 2018-08-09 MED FILL — !PROVENTIL HFA 90 MCG INH: 108 (90 BAS | 25 days supply | Qty: 1 | Fill #0

## 2018-08-09 MED FILL — $BRILINTA 90 MG TABLET: 90 | 60 days supply | Qty: 120 | Fill #0

## 2018-08-09 MED FILL — NITROGLYCERIN 0.4 MG TAB SL: 0.4 | 15 days supply | Qty: 25 | Fill #0

## 2018-08-09 NOTE — Progress Notes (Signed)
[facility] Patient Office Visit / Subjective:  Patient ID: Dustin Olsen., male    DOB: [birth year]  Age: 48 y.o. MRN: [id number]  CC:  Chief Complaint  Patient presents with  . Medication Refill   HPI Dustin Olsen. presents for medication refill. He was worked into the clinic today as he failed to return to follow-up visits with provider and cardiology. He is currently s/p STEMI 03/10/2018. He had established with Dr. Olga Millers cardiologist and was last seen 03/23/2018. He was also referred to urology for evaluation of a kidney lesion and he cancelled the appointment.  He reports he doesn't have health insurance. He has been provided with a [facility] Financial Assistance packet at prior encounter and he has not completed. He complains of shortness of breath and intermittent chest pain. He has not required use of nitroglycerin. He presented to the ER since his last office visit with a compliant of chest pain and second with alcohol intoxication.  He currently is free of active chest pain.  Trying to cut back on alcohol and continues to activity smoke.  Past Medical History:  Diagnosis Date  . Anxiety   . Bipolar disorder (HCC)   . CAD (coronary artery disease)    a. 03/11/18: acute STEMI s/p DES to LCx   . Depression   . Dyspnea   . GERD (gastroesophageal reflux disease)     Past Surgical History:  Procedure Laterality Date  . CORONARY/GRAFT ACUTE MI REVASCULARIZATION N/A 03/10/2018   Procedure: Coronary/Graft Acute MI Revascularization;  Surgeon: Lennette Bihari, MD;  Location: [facility] INVASIVE CV LAB;  Service: Cardiovascular;  Laterality: N/A;  . FOOT SURGERY    . LEFT HEART CATH AND CORONARY ANGIOGRAPHY N/A 03/10/2018   Procedure: LEFT HEART CATH AND CORONARY ANGIOGRAPHY;  Surgeon: Lennette Bihari, MD;  Location: [facility] INVASIVE CV LAB;  Service: Cardiovascular;  Laterality: N/A;  . NO PAST SURGERIES      Family History  Problem Relation Age of Onset  . Diabetes Mother   .  Hyperlipidemia Father   . Hypertension Father   . Coronary artery disease Father        Stents placed  . Heart disease Maternal Grandmother   . Heart disease Paternal Grandmother     Social History   Socioeconomic History  . Marital status: Single    Spouse name: Not on file  . Number of children: Not on file  . Years of education: Not on file  . Highest education level: Not on file  Occupational History  . Not on file  Social Needs  . Financial resource strain: Not on file  . Food insecurity:    Worry: Not on file    Inability: Not on file  . Transportation needs:    Medical: Not on file    Non-medical: Not on file  Tobacco Use  . Smoking status: Current Every Day Smoker    Packs/day: 1.00    Types: Cigarettes  . Smokeless tobacco: Never Used  Substance and Sexual Activity  . Alcohol use: Yes    Alcohol/week: 6.0 standard drinks    Types: 6 Cans of beer per week    Comment: 6 pack per day  . Drug use: Yes    Types: Marijuana  . Sexual activity: Not on file  Lifestyle  . Physical activity:    Days per week: Not on file    Minutes per session: Not on file  . Stress: Not on file  Relationships  . Social connections:    Talks on phone: Not on file    Gets together: Not on file    Attends religious service: Not on file    Active member of club or organization: Not on file    Attends meetings of clubs or organizations: Not on file    Relationship status: Not on file  . Intimate partner violence:    Fear of current or ex partner: Not on file    Emotionally abused: Not on file    Physically abused: Not on file    Forced sexual activity: Not on file  Other Topics Concern  . Not on file  Social History Narrative   ** Merged History Encounter **        Outpatient Medications Prior to Visit  Medication Sig Dispense Refill  . acetaminophen (TYLENOL) 500 MG tablet Take 1,000 mg by mouth every 6 (six) hours as needed for mild pain.    Marland Kitchen albuterol (PROVENTIL  HFA;VENTOLIN HFA) 108 (90 Base) MCG/ACT inhaler Inhale 1-2 puffs into the lungs every 6 (six) hours as needed for wheezing or shortness of breath. 1 Inhaler 0  . aspirin 81 MG chewable tablet Chew 1 tablet (81 mg total) by mouth daily.    Marland Kitchen atorvastatin (LIPITOR) 80 MG tablet Take 1 tablet (80 mg total) by mouth daily at 6 PM. 90 tablet 1  . BRILINTA 90 MG TABS tablet TAKE 1 TABLET (90 MG TOTAL) BY MOUTH 2 (TWO) TIMES DAILY. 14 tablet 0  . metoprolol tartrate (LOPRESSOR) 25 MG tablet Take 0.5 tablets (12.5 mg total) by mouth 2 (two) times daily. 30 tablet 6  . nitroGLYCERIN (NITROSTAT) 0.4 MG SL tablet Place 1 tablet (0.4 mg total) under the tongue every 5 (five) minutes x 3 doses as needed for chest pain. 25 tablet 12  . omeprazole (PRILOSEC) 20 MG capsule TAKE 1 CAPSULE (20 MG TOTAL) BY MOUTH DAILY. 30 capsule 3   No facility-administered medications prior to visit.      ROS Review of Systems Pertinent negatives listed in HPI   Objective:    Physical Exam  There were no vitals taken for this visit. Wt Readings from Last 3 Encounters:  05/16/18 210 lb (95.3 kg)  04/09/18 210 lb 15 oz (95.7 kg)  03/23/18 210 lb (95.3 kg)     Health Maintenance Due  Topic Date Due  . INFLUENZA VACCINE  01/18/2018    There are no preventive care reminders to display for this patient.  Lab Results  Component Value Date   TSH 0.862 03/10/2018   Lab Results  Component Value Date   WBC 5.8 05/16/2018   HGB 15.4 05/16/2018   HCT 46.2 05/16/2018   MCV 95.1 05/16/2018   PLT 438 (H) 05/16/2018   Lab Results  Component Value Date   NA 143 05/16/2018   K 3.7 05/16/2018   CO2 19 (L) 05/16/2018   GLUCOSE 94 05/16/2018   BUN <5 (L) 05/16/2018   CREATININE 0.73 05/16/2018   BILITOT 0.6 05/16/2018   ALKPHOS 70 05/16/2018   AST 72 (H) 05/16/2018   ALT 98 (H) 05/16/2018   PROT 7.4 05/16/2018   ALBUMIN 4.4 05/16/2018   CALCIUM 8.7 (L) 05/16/2018   ANIONGAP 15 05/16/2018   Lab Results   Component Value Date   CHOL 149 03/10/2018   Lab Results  Component Value Date   HDL 49 03/10/2018   Lab Results  Component Value Date   LDLCALC 83 03/10/2018  Lab Results  Component Value Date   TRIG 84 03/10/2018   Lab Results  Component Value Date   CHOLHDL 3.0 03/10/2018   Lab Results  Component Value Date   HGBA1C 5.4 03/10/2018      Assessment & Plan:  1. Coronary artery disease with stable angina pectoris, unspecified vessel or lesion type, unspecified whether native or transplanted heart [facility](HCC), with angina on-going.  -Patient advised to follow-up immediately with cardiology. He was given another financial assistance packet today.  Checking:  - Comprehensive metabolic panel Refilled brillianta, metoprolol, and nitroglycerin (PRN for chest pain)    2. Tobacco abuse -Encouraged cessation  3. Alcohol abuse -Encouraged cession, has cut back some.   4. Anticoagulated -Continue Brillinta Checking the following: - Comprehensive metabolic panel - PT AND PTT - CBC  5. Shortness of breath -Encouraged to discontinue smoking as this is likely worsening lung function and cardiovascular disease  -trial Albuterol inhaler  -Encouraged follow-up with cardiologist -if no improvement, will obtain chest x-ray at next visit    Meds ordered this encounter  Medications  . omeprazole (PRILOSEC) 20 MG capsule    Sig: Take 1 capsule (20 mg total) by mouth daily.    Dispense:  30 capsule    Refill:  3  . nitroGLYCERIN (NITROSTAT) 0.4 MG SL tablet    Sig: Place 1 tablet (0.4 mg total) under the tongue every 5 (five) minutes x 3 doses as needed for chest pain.    Dispense:  25 tablet    Refill:  12    Patient will pick up when needed.  . metoprolol tartrate (LOPRESSOR) 25 MG tablet    Sig: Take 0.5 tablets (12.5 mg total) by mouth 2 (two) times daily.    Dispense:  30 tablet    Refill:  6    Patient will pickup when needed  . ticagrelor (BRILINTA) 90 MG TABS tablet     Sig: TAKE 1 TABLET (90 MG TOTAL) BY MOUTH 2 (TWO) TIMES DAILY.    Dispense:  30 tablet    Refill:  3    Patient will need an appt for additional medication. Call office to schedule  . aspirin 81 MG chewable tablet    Sig: Chew 1 tablet (81 mg total) by mouth daily.    Patient will pick up when ready  . atorvastatin (LIPITOR) 80 MG tablet    Sig: Take 1 tablet (80 mg total) by mouth daily at 6 PM.    Dispense:  90 tablet    Refill:  1    Patient will pick up when ready  . albuterol (PROVENTIL HFA;VENTOLIN HFA) 108 (90 Base) MCG/ACT inhaler    Sig: Inhale 1-2 puffs into the lungs every 6 (six) hours as needed for wheezing or shortness of breath.    Dispense:  1 Inhaler    Refill:  0    Follow-up: Return in about 4 weeks (around 09/06/2018) for Shortness and breath, lipid panel, and possible chest x-ray. follow -up  on kidney lesion .    Joaquin CourtsKimberly Katheleen Stella, FNP

## 2018-08-10 LAB — CBC
Hematocrit: 41.6 % (ref 37.5–51.0)
Hemoglobin: 14.8 g/dL (ref 13.0–17.7)
MCH: 32.9 pg (ref 26.6–33.0)
MCHC: 35.6 g/dL (ref 31.5–35.7)
MCV: 92 fL (ref 79–97)
Platelets: 354 10*3/uL (ref 150–450)
RBC: 4.5 x10E6/uL (ref 4.14–5.80)
RDW: 12.7 % (ref 11.6–15.4)
WBC: 6.5 10*3/uL (ref 3.4–10.8)

## 2018-08-10 LAB — COMPREHENSIVE METABOLIC PANEL
ALT: 32 IU/L (ref 0–44)
AST: 29 IU/L (ref 0–40)
Albumin/Globulin Ratio: 1.8 (ref 1.2–2.2)
Albumin: 4.6 g/dL (ref 4.0–5.0)
Alkaline Phosphatase: 87 IU/L (ref 39–117)
BUN/Creatinine Ratio: 14 (ref 9–20)
BUN: 8 mg/dL (ref 6–24)
Bilirubin Total: 0.3 mg/dL (ref 0.0–1.2)
CO2: 21 mmol/L (ref 20–29)
Calcium: 9.4 mg/dL (ref 8.7–10.2)
Chloride: 102 mmol/L (ref 96–106)
Creatinine, Ser: 0.59 mg/dL — ABNORMAL LOW (ref 0.76–1.27)
GFR calc Af Amer: 140 mL/min/{1.73_m2} (ref >59)
GFR calc non Af Amer: 121 mL/min/{1.73_m2} (ref >59)
Globulin, Total: 2.5 g/dL (ref 1.5–4.5)
Glucose: 91 mg/dL (ref 65–99)
Potassium: 4.6 mmol/L (ref 3.5–5.2)
Sodium: 139 mmol/L (ref 134–144)
Total Protein: 7.1 g/dL (ref 6.0–8.5)

## 2018-08-10 LAB — PT AND PTT
INR: 1 (ref 0.8–1.2)
Prothrombin Time: 10.5 s (ref 9.1–12.0)
aPTT: 24 s (ref 24–33)

## 2018-08-20 NOTE — Progress Notes (Signed)
Patient notified of results & recommendations. Expressed understanding. Gave patient the number to Dr. Ludwig Clarks office so that he can schedule his follow up with Cardiology.

## 2018-08-29 ENCOUNTER — Inpatient Hospital Stay: Payer: Self-pay | Admitting: Family Medicine

## 2018-09-07 ENCOUNTER — Encounter: Payer: Self-pay | Admitting: Family Medicine

## 2018-09-07 ENCOUNTER — Ambulatory Visit (INDEPENDENT_AMBULATORY_CARE_PROVIDER_SITE_OTHER): Payer: Self-pay | Admitting: Family Medicine

## 2018-09-07 ENCOUNTER — Other Ambulatory Visit: Payer: Self-pay

## 2018-09-07 VITALS — BP 144/95 | HR 89 | Temp 97.9°F | Resp 22 | Wt 219.0 lb

## 2018-09-07 DIAGNOSIS — I25118 Atherosclerotic heart disease of native coronary artery with other forms of angina pectoris: Secondary | ICD-10-CM

## 2018-09-07 DIAGNOSIS — Z7901 Long term (current) use of anticoagulants: Secondary | ICD-10-CM

## 2018-09-07 DIAGNOSIS — E785 Hyperlipidemia, unspecified: Secondary | ICD-10-CM

## 2018-09-07 DIAGNOSIS — Z720 Tobacco use: Secondary | ICD-10-CM

## 2018-09-07 MED ORDER — ATORVASTATIN CALCIUM 80 MG PO TABS: 80.0000 mg | ORAL_TABLET | Freq: Every day | ORAL | 3 refills | Status: DC

## 2018-09-07 MED ORDER — ALBUTEROL SULFATE HFA 108 (90 BASE) MCG/ACT IN AERS: 1.0000 | INHALATION_SPRAY | Freq: Four times a day (QID) | RESPIRATORY_TRACT | 0 refills | Status: DC | PRN

## 2018-09-07 MED ORDER — METOPROLOL TARTRATE 25 MG PO TABS: 12.5000 mg | ORAL_TABLET | Freq: Two times a day (BID) | ORAL | 6 refills | Status: DC

## 2018-09-07 MED FILL — !PROVENTIL HFA 90 MCG INH: 108 (90 BAS | 20 days supply | Qty: 18 | Fill #0

## 2018-09-07 NOTE — Patient Instructions (Signed)
Schedule follow-up with Cardiology, Dr. Jens Som at   Cardiologist  [address], [place]  [phone]  Open  Closes 5 PM   No medication changes today. Continue efforts to reduce and eventually quit smoking to improved breathing and cardiovascular disease.    Shortness of Breath, Adult Shortness of breath means you have trouble breathing. Shortness of breath could be a sign of a medical problem. Follow these instructions at home:   Watch for any changes in your symptoms.  Do not use any products that contain nicotine or tobacco, such as cigarettes, e-cigarettes, and chewing tobacco.  Do not smoke. Smoking can cause shortness of breath. If you need help to quit smoking, ask your doctor.  Avoid things that can make it harder to breathe, such as: ? Mold. ? Dust. ? Air pollution. ? Chemical smells. ? Things that can cause allergy symptoms (allergens), if you have allergies.  Keep your living space clean. Use products that help remove mold and dust.  Rest as needed. Slowly return to your normal activities.  Take over-the-counter and prescription medicines only as told by your doctor. This includes oxygen therapy and inhaled medicines.  Keep all follow-up visits as told by your doctor. This is important. Contact a doctor if:  Your condition does not get better as soon as expected.  You have a hard time doing your normal activities, even after you rest.  You have new symptoms. Get help right away if:  Your shortness of breath gets worse.  You have trouble breathing when you are resting.  You feel light-headed or you pass out (faint).  You have a cough that is not helped by medicines.  You cough up blood.  You have pain with breathing.  You have pain in your chest, arms, shoulders, or belly (abdomen).  You have a fever.  You cannot walk up stairs.  You cannot exercise the way you normally do. These symptoms may represent a serious problem  that is an emergency. Do not wait to see if the symptoms will go away. Get medical help right away. Call your local emergency services (911 in the [place] Do not drive yourself to the hospital. Summary  Shortness of breath is when you have trouble breathing enough air. It can be a sign of a medical problem.  Avoid things that make it hard for you to breathe, such as smoking, pollution, mold, and dust.  Watch for any changes in your symptoms. Contact your doctor if you do not get better or you get worse. This information is not intended to replace advice given to you by your health care provider. Make sure you discuss any questions you have with your health care provider. Document Released: 11/23/2007 Document Revised: 11/06/2017 Document Reviewed: 11/06/2017 Elsevier Interactive Patient Education  2019 [employer].   Steps to Quit Smoking  Smoking tobacco can be bad for your health. It can also affect almost every organ in your body. Smoking puts you and people around you at risk for many serious long-lasting (chronic) diseases. Quitting smoking is hard, but it is one of the best things that you can do for your health. It is never too late to quit. What are the benefits of quitting smoking? When you quit smoking, you lower your risk for getting serious diseases and conditions. They can include:  Lung cancer or lung disease.  Heart disease.  Stroke.  Heart attack.  Not being able to have children (infertility).  Weak bones (osteoporosis)  and broken bones (fractures). If you have coughing, wheezing, and shortness of breath, those symptoms may get better when you quit. You may also get sick less often. If you are pregnant, quitting smoking can help to lower your chances of having a baby of low birth weight. What can I do to help me quit smoking? Talk with your doctor about what can help you quit smoking. Some things you can do (strategies) include:  Quitting smoking totally, instead  of slowly cutting back how much you smoke over a period of time.  Going to in-person counseling. You are more likely to quit if you go to many counseling sessions.  Using resources and support systems, such as: ? Agricultural engineer with a Veterinary surgeon. ? Phone quitlines. ? Automotive engineer. ? Support groups or group counseling. ? Text messaging programs. ? Mobile phone apps or applications.  Taking medicines. Some of these medicines may have nicotine in them. If you are pregnant or breastfeeding, do not take any medicines to quit smoking unless your doctor says it is okay. Talk with your doctor about counseling or other things that can help you. Talk with your doctor about using more than one strategy at the same time, such as taking medicines while you are also going to in-person counseling. This can help make quitting easier. What things can I do to make it easier to quit? Quitting smoking might feel very hard at first, but there is a lot that you can do to make it easier. Take these steps:  Talk to your family and friends. Ask them to support and encourage you.  Call phone quitlines, reach out to support groups, or work with a Veterinary surgeon.  Ask people who smoke to not smoke around you.  Avoid places that make you want (trigger) to smoke, such as: ? Bars. ? Parties. ? Smoke-break areas at work.  Spend time with people who do not smoke.  Lower the stress in your life. Stress can make you want to smoke. Try these things to help your stress: ? Getting regular exercise. ? Deep-breathing exercises. ? Yoga. ? Meditating. ? Doing a body scan. To do this, close your eyes, focus on one area of your body at a time from head to toe, and notice which parts of your body are tense. Try to relax the muscles in those areas.  Download or buy apps on your mobile phone or tablet that can help you stick to your quit plan. There are many free apps, such as QuitGuide from the [employer] developer for  Disease Control and Prevention). You can find more support from smokefree.gov and other websites. This information is not intended to replace advice given to you by your health care provider. Make sure you discuss any questions you have with your health care provider. Document Released: 04/02/2009 Document Revised: 02/02/2016 Document Reviewed: 10/21/2014 Elsevier Interactive Patient Education  2019 [employer].

## 2018-09-07 NOTE — Progress Notes (Signed)
Patient ID: Dustin Laurence., male    DOB: Jun 07, 1971, 48 y.o.   MRN: [id number]  PCP: Bing Neighbors, FNP  Chief Complaint  Patient presents with  . Follow-up    Subjective:  HPI  Dustin Sirko. is a 48 y.o. male presents for evaluation   has Tobacco abuse; Alcohol abuse; Transaminitis; GERD (gastroesophageal reflux disease); Depression; Bipolar disorder [facility]); STEMI involving left circumflex coronary artery (HCC); and CAD (coronary artery disease) on their problem list.   Patient complains of continued shortness of breath.  He has not stopped smoking since our last visit.  He reports routine use of his albuterol inhaler.  Patient has not followed up with cardiology as recommended during his prior office visit.  He has been approved for orange card since his last visit and now has some form of financial assistance.  Reports compliance with medication however requests refills. He is using albuterol almost daily for shortness of breath. He continues smoke heavily. Not ready to quit. He endorses intermittent chest pain. He has not taken nitroglycerin for CP.  Social History   Socioeconomic History  . Marital status: Single    Spouse name: Not on file  . Number of children: Not on file  . Years of education: Not on file  . Highest education level: Not on file  Occupational History  . Not on file  Social Needs  . Financial resource strain: Not on file  . Food insecurity:    Worry: Not on file    Inability: Not on file  . Transportation needs:    Medical: Not on file    Non-medical: Not on file  Tobacco Use  . Smoking status: Current Every Day Smoker    Packs/day: 1.00    Types: Cigarettes  . Smokeless tobacco: Never Used  Substance and Sexual Activity  . Alcohol use: Yes    Alcohol/week: 6.0 standard drinks    Types: 6 Cans of beer per week    Comment: 6 pack per day  . Drug use: Yes    Types: Marijuana  . Sexual activity: Not on file  Lifestyle  . Physical activity:     Days per week: Not on file    Minutes per session: Not on file  . Stress: Not on file  Relationships  . Social connections:    Talks on phone: Not on file    Gets together: Not on file    Attends religious service: Not on file    Active member of club or organization: Not on file    Attends meetings of clubs or organizations: Not on file    Relationship status: Not on file  . Intimate partner violence:    Fear of current or ex partner: Not on file    Emotionally abused: Not on file    Physically abused: Not on file    Forced sexual activity: Not on file  Other Topics Concern  . Not on file  Social History Narrative   ** Merged History Encounter **        Family History  Problem Relation Age of Onset  . Diabetes Mother   . Hyperlipidemia Father   . Hypertension Father   . Coronary artery disease Father        Stents placed  . Heart disease Maternal Grandmother   . Heart disease Paternal Grandmother    Review of Systems  Pertinent negatives listed in HPI  Prior to Admission medications   Medication Sig  Start Date End Date Taking? Authorizing Provider  albuterol (PROVENTIL HFA;VENTOLIN HFA) 108 (90 Base) MCG/ACT inhaler Inhale 1-2 puffs into the lungs every 6 (six) hours as needed for wheezing or shortness of breath. 09/07/18  Yes Bing Neighbors, FNP  aspirin 81 MG chewable tablet Chew 1 tablet (81 mg total) by mouth daily. 08/09/18  Yes Bing Neighbors, FNP  atorvastatin (LIPITOR) 80 MG tablet Take 1 tablet (80 mg total) by mouth daily at 6 PM. 09/07/18  Yes Bing Neighbors, FNP  metoprolol tartrate (LOPRESSOR) 25 MG tablet Take 0.5 tablets (12.5 mg total) by mouth 2 (two) times daily. 09/07/18  Yes Bing Neighbors, FNP  nitroGLYCERIN (NITROSTAT) 0.4 MG SL tablet Place 1 tablet (0.4 mg total) under the tongue every 5 (five) minutes x 3 doses as needed for chest pain. 08/09/18  Yes Bing Neighbors, FNP  omeprazole (PRILOSEC) 20 MG capsule Take 1 capsule (20 mg  total) by mouth daily. 08/09/18  Yes Bing Neighbors, FNP  ticagrelor (BRILINTA) 90 MG TABS tablet TAKE 1 TABLET (90 MG TOTAL) BY MOUTH 2 (TWO) TIMES DAILY. 08/09/18  Yes Bing Neighbors, FNP    Past Medical, Surgical Family and Social History reviewed and updated.    Objective:   Today's Vitals   09/07/18 0929  BP: (!) 144/95  Pulse: 89  Resp: (!) 22  Temp: 97.9 F (36.6 C)  TempSrc: Oral  SpO2: 96%  Weight: 219 lb (99.3 kg)  PainSc: 0-No pain    BP Readings from Last 3 Encounters:  09/07/18 (!) 144/95  08/09/18 131/86  05/17/18 119/72    Filed Weights   09/07/18 0929  Weight: 219 lb (99.3 kg)       Physical Exam General appearance: alert, well developed, well nourished, cooperative and in no distress Head: Normocephalic, without obvious abnormality, atraumatic Respiratory: Respirations even and unlabored, normal respiratory rate Heart: rate and rhythm normal. No gallop or murmurs noted on exam  Abdomen: BS +, no distention, no rebound tenderness, or no mass Extremities: No gross deformities Skin: Skin color, texture, turgor normal. No rashes seen Psych: Appropriate mood and affect. Neurologic: Alert, oriented to person, place, and time, thought content appropriate.   Assessment & Plan:  1. Hyperlipidemia, unspecified hyperlipidemia type - Lipid panel  2. Coronary artery disease with stable angina pectoris, unspecified vessel or lesion type, unspecified whether native or transplanted heart (HCC) -intermittent Angina.  S/p STEMI 9/19, no recent follow-up with cardiology. Advised to follow-up with cardiology  3. Anticoagulated Continue Brillinta   4. Tobacco abuse Encouraged cessation.   5. Shortness of breath  -Follow-up with cardiology  -Continue Albuterol  -Stop smoking   Joaquin Courts, FNP Primary Care at [facility] [address] [place] [address] [phone]: [phone]

## 2018-09-07 NOTE — Progress Notes (Deleted)
F/u SOB- Out of MDI

## 2018-09-08 LAB — LIPID PANEL
Chol/HDL Ratio: 2.6 ratio (ref 0.0–5.0)
Cholesterol, Total: 124 mg/dL (ref 100–199)
HDL: 47 mg/dL (ref >39)
LDL Calculated: 64 mg/dL (ref 0–99)
Triglycerides: 63 mg/dL (ref 0–149)
VLDL Cholesterol Cal: 13 mg/dL (ref 5–40)

## 2018-09-10 NOTE — Progress Notes (Signed)
Patient notified of results & recommendations. Expressed understanding.

## 2018-09-11 MED FILL — ATORVASTATIN 80 MG TABLET: 80 | 30 days supply | Qty: 30 | Fill #0

## 2018-09-11 MED FILL — ?METOPROLOL TARTRATE 25 MG: 25 | 90 days supply | Qty: 90 | Fill #0

## 2018-10-03 ENCOUNTER — Other Ambulatory Visit: Payer: Self-pay | Admitting: Primary Care

## 2018-10-03 ENCOUNTER — Other Ambulatory Visit: Payer: Self-pay | Admitting: Family Medicine

## 2018-10-03 MED FILL — ATORVASTATIN 80 MG TABLET: 80 | 30 days supply | Qty: 30 | Fill #1

## 2018-10-03 MED FILL — ?OMEPRAZOLE 20 MG CAPSULE D: 20 | 30 days supply | Qty: 30 | Fill #1

## 2018-10-04 ENCOUNTER — Other Ambulatory Visit: Payer: Self-pay | Admitting: Family Medicine

## 2018-10-05 MED FILL — !PROVENTIL HFA 90 MCG INH: 108 (90 BAS | 20 days supply | Qty: 18 | Fill #0

## 2018-10-05 MED FILL — $BRILINTA 90 MG TABLET: 90 | 6 days supply | Qty: 120 | Fill #0

## 2018-11-16 ENCOUNTER — Other Ambulatory Visit: Payer: Self-pay | Admitting: Family Medicine

## 2018-11-16 ENCOUNTER — Telehealth: Payer: Self-pay | Admitting: Family Medicine

## 2018-11-16 MED FILL — !PROVENTIL HFA 90 MCG INH: 108 (90 BAS | 25 days supply | Qty: 1 | Fill #0

## 2018-11-16 MED FILL — NITROGLYCERIN 0.4 MG TAB SL: 0.4 | 15 days supply | Qty: 25 | Fill #1

## 2018-11-16 MED FILL — ?ATORVASTATIN 40MG TABLET: 40 | 30 days supply | Qty: 60 | Fill #0

## 2018-11-16 MED FILL — ?OMEPRAZOLE 20 MG CAPSULE D: 20 | 30 days supply | Qty: 30 | Fill #2

## 2018-11-16 NOTE — Telephone Encounter (Signed)
Caller Name: Tykell Lineback  Reason for Call: Medication refill for symbicort inhaler   If this is a medication request: confirm pharmacy  [facility]  Verify call back number:  [phone]  Action taken by recipient of request:

## 2018-11-16 NOTE — Telephone Encounter (Signed)
Called mom to clarify since patient is not on Symbicort. Patient needs refills on albuterol inhaler. Refill sent to [facility].

## 2018-12-11 ENCOUNTER — Encounter (HOSPITAL_COMMUNITY): Payer: Self-pay

## 2018-12-11 ENCOUNTER — Other Ambulatory Visit: Payer: Self-pay

## 2018-12-11 ENCOUNTER — Emergency Department (HOSPITAL_COMMUNITY)
Admission: EM | Admit: 2018-12-11 | Discharge: 2018-12-12 | Disposition: A | Discharge status: Home / Self Care | Payer: Self-pay | Attending: Emergency Medicine | Admitting: Emergency Medicine

## 2018-12-11 ENCOUNTER — Emergency Department (HOSPITAL_COMMUNITY): Payer: Self-pay

## 2018-12-11 DIAGNOSIS — F17210 Nicotine dependence, cigarettes, uncomplicated: Secondary | ICD-10-CM | POA: Insufficient documentation

## 2018-12-11 DIAGNOSIS — Y939 Activity, unspecified: Secondary | ICD-10-CM | POA: Insufficient documentation

## 2018-12-11 DIAGNOSIS — I252 Old myocardial infarction: Secondary | ICD-10-CM | POA: Insufficient documentation

## 2018-12-11 DIAGNOSIS — Z7902 Long term (current) use of antithrombotics/antiplatelets: Secondary | ICD-10-CM | POA: Insufficient documentation

## 2018-12-11 DIAGNOSIS — Z7982 Long term (current) use of aspirin: Secondary | ICD-10-CM | POA: Insufficient documentation

## 2018-12-11 DIAGNOSIS — Y929 Unspecified place or not applicable: Secondary | ICD-10-CM | POA: Insufficient documentation

## 2018-12-11 DIAGNOSIS — I259 Chronic ischemic heart disease, unspecified: Secondary | ICD-10-CM | POA: Insufficient documentation

## 2018-12-11 DIAGNOSIS — Z79899 Other long term (current) drug therapy: Secondary | ICD-10-CM | POA: Insufficient documentation

## 2018-12-11 DIAGNOSIS — S81811A Laceration without foreign body, right lower leg, initial encounter: Secondary | ICD-10-CM | POA: Insufficient documentation

## 2018-12-11 DIAGNOSIS — Y999 Unspecified external cause status: Secondary | ICD-10-CM | POA: Insufficient documentation

## 2018-12-11 DIAGNOSIS — X58XXXA Exposure to other specified factors, initial encounter: Secondary | ICD-10-CM | POA: Insufficient documentation

## 2018-12-11 NOTE — ED Triage Notes (Signed)
Pt arrives POV for eval of RLE laceration. Pt reports he was working on a house this afternoon and cut his leg on "a surface of the house" at about 1500. Pt states he didn't notice it until  Someone pointed it out. Bandage in place in triage, hemostatic at this time. Takes Brilanta. Tetanus UTD, rec'd in 2019

## 2018-12-11 NOTE — ED Notes (Signed)
Called patient to take to treatment room, no response 

## 2018-12-12 ENCOUNTER — Other Ambulatory Visit: Payer: Self-pay | Admitting: Family Medicine

## 2018-12-12 MED ORDER — DOXYCYCLINE HYCLATE 100 MG PO CAPS: 100.0000 mg | ORAL_CAPSULE | Freq: Two times a day (BID) | ORAL | 0 refills | Status: DC

## 2018-12-12 MED ORDER — LIDOCAINE-EPINEPHRINE (PF) 2 %-1:200000 IJ SOLN
INTRAMUSCULAR | Status: AC
  Administered 2018-12-12
  Filled 2018-12-12: qty 20

## 2018-12-12 MED FILL — ?OMEPRAZOLE 20 MG CAPSULE D: 20 | 30 days supply | Qty: 30 | Fill #3

## 2018-12-12 MED FILL — ?DOXYCYCLINE HYCL 100MG: 100 | 10 days supply | Qty: 20 | Fill #0

## 2018-12-12 MED FILL — $BRILINTA 90 MG TABLET: 90 | 30 days supply | Qty: 60 | Fill #0

## 2018-12-12 MED FILL — !PROVENTIL HFA 90 MCG INH: 108 (90 BAS | 25 days supply | Qty: 1 | Fill #0

## 2018-12-12 MED FILL — ?METOPROLOL 25 MG TABLET: 25 | 90 days supply | Qty: 90 | Fill #1

## 2018-12-12 MED FILL — ?ATORVASTATIN 40MG TABLET: 40 | 30 days supply | Qty: 60 | Fill #1

## 2018-12-12 NOTE — Telephone Encounter (Signed)
Please advise 

## 2018-12-12 NOTE — ED Provider Notes (Signed)
[facility] EM[facility]GENCY [facility] Provider Note   CSN: [id number] Arrival date & time: 12/11/18  2247     History   Chief Complaint Chief Complaint  Patient presents with  . Extremity Laceration    HPI Dustin DraftFloyd T Franken Jr. is a 48 y.o. male.     Patient presents to the emergency department with a chief complaint of laceration of his lateral right lower extremity.  He states that he is uncertain how it happened.  He states that occurred this afternoon sometime around 3:00.  He has had difficulty controlling the bleeding.  He is anticoagulated on Brilinta.  His last tetanus shot was in 2019.  He denies any significant pain.  The history is provided by the patient. No language interpreter was used.    Past Medical History:  Diagnosis Date  . Anxiety   . Bipolar disorder (HCC)   . CAD (coronary artery disease)    a. 03/11/18: acute STEMI s/p DES to LCx   . Depression   . Dyspnea   . GERD (gastroesophageal reflux disease)     Patient Active Problem List   Diagnosis Date Noted  . CAD (coronary artery disease)   . STEMI involving left circumflex coronary artery (HCC) 03/10/2018  . Depression 05/07/2017  . Bipolar disorder (HCC) 05/07/2017  . Tobacco abuse 05/10/2012  . Alcohol abuse 05/10/2012  . Transaminitis 05/10/2012  . GERD (gastroesophageal reflux disease) 05/10/2012    Past Surgical History:  Procedure Laterality Date  . CORONARY/GRAFT ACUTE MI REVASCULARIZATION N/A 03/10/2018   Procedure: Coronary/Graft Acute MI Revascularization;  Surgeon: Lennette BihariKelly, Thomas A, MD;  [facility]: [facility] INVASIVE CV LAB;  Service: Cardiovascular;  Laterality: N/A;  . FOOT SURGERY    . LEFT HEART CATH AND CORONARY ANGIOGRAPHY N/A 03/10/2018   Procedure: LEFT HEART CATH AND CORONARY ANGIOGRAPHY;  Surgeon: Lennette BihariKelly, Thomas A, MD;  Location: [facility] INVASIVE CV LAB;  Service: Cardiovascular;  Laterality: N/A;  . NO PAST SURGERIES          Home Medications    Prior to Admission  medications   Medication Sig Start Date End Date Taking? Authorizing Provider  aspirin 81 MG chewable tablet Chew 1 tablet (81 mg total) by mouth daily. 08/09/18   Bing NeighborsHarris, Kimberly S, FNP  atorvastatin (LIPITOR) 80 MG tablet Take 1 tablet (80 mg total) by mouth daily at 6 PM. 09/07/18   Bing NeighborsHarris, Kimberly S, FNP  BRILINTA 90 MG TABS tablet TAKE 1 TABLET (90 MG TOTAL) BY MOUTH 2 (TWO) TIMES DAILY. 10/05/18   Bing NeighborsHarris, Kimberly S, FNP  doxycycline (VIBRAMYCIN) 100 MG capsule Take 1 capsule (100 mg total) by mouth 2 (two) times daily. 12/12/18   Roxy HorsemanBrowning, Martika Egler, PA-C  metoprolol tartrate (LOPRESSOR) 25 MG tablet Take 0.5 tablets (12.5 mg total) by mouth 2 (two) times daily. 09/07/18   Bing NeighborsHarris, Kimberly S, FNP  nitroGLYCERIN (NITROSTAT) 0.4 MG SL tablet Place 1 tablet (0.4 mg total) under the tongue every 5 (five) minutes x 3 doses as needed for chest pain. 08/09/18   Bing NeighborsHarris, Kimberly S, FNP  omeprazole (PRILOSEC) 20 MG capsule Take 1 capsule (20 mg total) by mouth daily. 08/09/18   Bing NeighborsHarris, Kimberly S, FNP  PROVENTIL HFA 108 [phone](90 Base) MCG/ACT inhaler INHALE 1-2 PUFFS INTO THE LUNGS EVERY 6 (SIX) HOURS AS NEEDED FOR WHEEZING OR SHORTNESS OF BREATH. 11/16/18   Bing NeighborsHarris, Kimberly S, FNP    Family History Family History  Problem Relation Age of Onset  . Diabetes Mother   . Hyperlipidemia  Father   . Hypertension Father   . Coronary artery disease Father        Stents placed  . Heart disease Maternal Grandmother   . Heart disease Paternal Grandmother     Social History Social History   Tobacco Use  . Smoking status: Current Every Day Smoker    Packs/day: 1.00    Types: Cigarettes  . Smokeless tobacco: Never Used  Substance Use Topics  . Alcohol use: Yes    Alcohol/week: 6.0 standard drinks    Types: 6 Cans of beer per week    Comment: 6 pack per day  . Drug use: Yes    Types: Marijuana     Allergies   Penicillins and Tramadol hcl   Review of Systems Review of Systems  All other systems  reviewed and are negative.    Physical Exam Updated Vital Signs BP (!) 172/90 (BP Location: Left Arm)   Pulse (!) 113   Temp 98.3 F (36.8 C) (Oral)   Resp 16   Ht 5\' 10"  (1.778 m)   Wt 99.8 kg   SpO2 96%   BMI 31.57 kg/m   Physical Exam Vitals signs and nursing note reviewed.  Constitutional:      Appearance: He is well-developed.  HENT:     Head: Normocephalic and atraumatic.  Eyes:     Conjunctiva/sclera: Conjunctivae normal.  Neck:     Musculoskeletal: Neck supple.  Cardiovascular:     Rate and Rhythm: Regular rhythm.     Heart sounds: No murmur.  Pulmonary:     Effort: Pulmonary effort is normal. No respiratory distress.  Abdominal:     Palpations: Abdomen is soft.  Skin:    General: Skin is warm and dry.     Comments: 1 cm laceration to anterior right shin No foreign body  Neurological:     Mental Status: He is alert.  Psychiatric:        Mood and Affect: Mood normal.        Behavior: Behavior normal.        Thought Content: Thought content normal.        Judgment: Judgment normal.      ED Treatments / Results  Labs (all labs ordered are listed, but only abnormal results are displayed) Labs Reviewed - No data to display  EKG    Radiology Dg Tibia/fibula Right  Result Date: 12/11/2018 CLINICAL DATA:  Laceration EXAM: RIGHT TIBIA AND FIBULA - 2 VIEW COMPARISON:  None. FINDINGS: There is a large soft tissue defect involving the anterior right lower extremity at the level of the distal tibia/fibula. There is no underlying osseous defect or radiopaque foreign body. IMPRESSION: Soft tissue laceration of the lower extremity without evidence of an acute displaced fracture or radiopaque foreign body. Electronically Signed   By: Constance Holster M.D.   On: 12/11/2018 23:36    Procedures Procedures (including critical care time) LACERATION REPAIR Performed by: Montine Circle Authorized by: Montine Circle Consent: Verbal consent obtained. Risks and  benefits: risks, benefits and alternatives were discussed Consent given by: patient Patient identity confirmed: provided demographic data Prepped and Draped in normal sterile fashion Wound explored  Laceration Location: Right anterior shin  Laceration Length: 1 cm  No Foreign Bodies seen or palpated  Anesthesia: local infiltration  Local anesthetic: lidocaine 1 % with epinephrine  Anesthetic total: 2 ml  Irrigation method: syringe Amount of cleaning: standard  Skin closure: 3-0 Prolene  Number of sutures: 1  Technique: Figure  of 8  Patient tolerance: Patient tolerated the procedure well with no immediate complications.  Medications Ordered in ED Medications  lidocaine-EPINEPHrine (XYLOCAINE W/EPI) 2 %-1:200000 (PF) injection (  Given by Other 12/12/18 0010)     Initial Impression / Assessment and Plan / ED Course  I have reviewed the triage vital signs and the nursing notes.  Pertinent labs & imaging results that were available during my care of the patient were reviewed by me and considered in my medical decision making (see chart for details).        Patient with small bleeding laceration right lower extremity.  Laceration occurred sometime this afternoon around 3:00.  He has had difficulty controlling the bleeding due to being anticoagulated on Brilinta.  The wound is small, but continues to ooze.  I placed a single figure-of-eight suture achieving hemostasis.  Given the amount of time that the wound has been open, I will place patient on antibiotics.  Discussed that this is high risk given the length of time, but primary closure indicated to achieve hemostasis.  Tdap is current.  Final Clinical Impressions(s) / ED Diagnoses   Final diagnoses:  Laceration of right lower extremity, initial encounter    ED Discharge Orders         Ordered    doxycycline (VIBRAMYCIN) 100 MG capsule  2 times daily     12/12/18 0016           Roxy HorsemanBrowning, Deiondre Harrower, PA-C 12/12/18  0019    Horton, Mayer Maskerourtney F, MD 12/12/18 [phone]

## 2019-01-04 ENCOUNTER — Telehealth: Payer: Self-pay

## 2019-01-04 NOTE — Telephone Encounter (Signed)
Called patient to do their pre-visit COVID screening.  Call went to voicemail. Unable to do prescreening.  

## 2019-01-07 ENCOUNTER — Other Ambulatory Visit: Payer: Self-pay

## 2019-01-07 ENCOUNTER — Encounter: Payer: Self-pay | Admitting: Family Medicine

## 2019-01-07 ENCOUNTER — Ambulatory Visit (INDEPENDENT_AMBULATORY_CARE_PROVIDER_SITE_OTHER): Payer: Self-pay | Admitting: Family Medicine

## 2019-01-07 VITALS — BP 143/89 | HR 86 | Temp 97.5°F | Resp 17 | Ht 69.0 in | Wt 220.4 lb

## 2019-01-07 DIAGNOSIS — F17200 Nicotine dependence, unspecified, uncomplicated: Secondary | ICD-10-CM

## 2019-01-07 DIAGNOSIS — I25118 Atherosclerotic heart disease of native coronary artery with other forms of angina pectoris: Secondary | ICD-10-CM

## 2019-01-07 DIAGNOSIS — Z7901 Long term (current) use of anticoagulants: Secondary | ICD-10-CM

## 2019-01-07 DIAGNOSIS — Z09 Encounter for follow-up examination after completed treatment for conditions other than malignant neoplasm: Secondary | ICD-10-CM

## 2019-01-07 DIAGNOSIS — R0602 Shortness of breath: Secondary | ICD-10-CM

## 2019-01-07 DIAGNOSIS — F17210 Nicotine dependence, cigarettes, uncomplicated: Secondary | ICD-10-CM

## 2019-01-07 DIAGNOSIS — K219 Gastro-esophageal reflux disease without esophagitis: Secondary | ICD-10-CM

## 2019-01-07 MED ORDER — PREDNISONE 20 MG PO TABS: ORAL_TABLET | ORAL | 0 refills | Status: DC

## 2019-01-07 MED ORDER — OMEPRAZOLE 20 MG PO CPDR: 20.0000 mg | DELAYED_RELEASE_CAPSULE | Freq: Two times a day (BID) | ORAL | 3 refills | Status: DC

## 2019-01-07 MED ORDER — ALBUTEROL SULFATE HFA 108 (90 BASE) MCG/ACT IN AERS: 2.0000 | INHALATION_SPRAY | Freq: Four times a day (QID) | RESPIRATORY_TRACT | 5 refills | Status: DC | PRN

## 2019-01-07 MED ORDER — DOXYCYCLINE HYCLATE 100 MG PO TABS: 100.0000 mg | ORAL_TABLET | Freq: Two times a day (BID) | ORAL | 0 refills | Status: DC

## 2019-01-07 MED ORDER — TICAGRELOR 90 MG PO TABS: 90.0000 mg | ORAL_TABLET | Freq: Two times a day (BID) | ORAL | 5 refills | Status: DC

## 2019-01-07 MED FILL — ?OMEPRAZOLE 20 MG CAPSULE D: 20 | 30 days supply | Qty: 60 | Fill #0

## 2019-01-07 MED FILL — ?DOXYCYCLINE HYCLATE 100MG: 100 | 10 days supply | Qty: 20 | Fill #0

## 2019-01-07 MED FILL — ALBUTEROL SULFATE HFA 108 (: 108 (90 BAS | 25 days supply | Qty: 18 | Fill #0

## 2019-01-07 MED FILL — $BRILINTA 90 MG TABLET: 90 | 30 days supply | Qty: 60 | Fill #0

## 2019-01-07 MED FILL — ?PREDNISONE 10 MG TABLET: 10 | 5 days supply | Qty: 20 | Fill #0

## 2019-01-07 MED FILL — ?ATORVASTATIN 40MG TABLET: 40 | 30 days supply | Qty: 60 | Fill #2

## 2019-01-07 NOTE — Progress Notes (Signed)
Established Patient Office Visit  Subjective:  Patient ID: Dustin Olsen., male    DOB: 1970-07-17  Age: 48 y.o. MRN: [id number]  CC:  Chief Complaint  Patient presents with  . Hyperlipidemia    HPI [name]. presents for follow-up of chronic medical issues including coronary artery disease, hyperlipidemia, GERD, and long-term use of anticoagulant medication.  Patient also continues to smoke approximately 1 pack/day of cigarettes and continues to have issues with shortness of breath.  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  He states that he attempted to stop the bleeding himself including trying to use electrical tape around the area but the bleeding would not stop.  He thinks that it may have been 6 to 8 hours between the time when he cut himself and when he finally went to the emergency department for further evaluation.      He reports that he has not followed up with his cardiologist in some time.  He does have issues with recurrent chest pain.  He states that this weekend he felt as if he was having both left and right-sided chest pain as well as pain in his left and right upper back and right side of the neck.  He states that this did not feel typical of his pain with prior MI but he has had some episodes of left-sided chest pressure.  He has not taken nitroglycerin for chest pain since about April.  He reports that he is compliant with his daily aspirin, Lipitor and Brilinta.  He does not believe that the Lipitor is causing any increase in muscle or joint pain.  Before his recent cut on his shin, he had not had any issues with unusual bruising or bleeding.  He does continue to feel short of breath especially with activity and has had some increase in fatigue.  He continues to smoke 1 pack/day of cigarettes and would  like to have a refill of albuterol at today's visit.  He does not believe that he is able to stop smoking at this time.  Patient denies issues with daytime fatigue other than the fatigue he feels is related to work, morning headaches and he does not believe that he snores other than when he is congested.  He does report an increase in nausea and reflux type symptoms.  He is currently on omeprazole 20 mg daily.  Past Medical History:  Diagnosis Date  . Anxiety   . Bipolar disorder (HCC)   . CAD (coronary artery disease)    a. 03/11/18: acute STEMI s/p DES to LCx   . Depression   . Dyspnea   . GERD (gastroesophageal reflux disease)     Past Surgical History:  Procedure Laterality Date  . CORONARY/GRAFT ACUTE MI REVASCULARIZATION N/A 03/10/2018   Procedure: Coronary/Graft Acute MI Revascularization;  Surgeon: Lennette Bihari, MD;  Location: [facility] INVASIVE CV LAB;  Service: Cardiovascular;  Laterality: N/A;  . FOOT SURGERY    . LEFT HEART CATH AND CORONARY ANGIOGRAPHY N/A 03/10/2018   Procedure: LEFT HEART CATH AND CORONARY ANGIOGRAPHY;  Surgeon: Lennette Bihari, MD;  Location: [facility] INVASIVE CV LAB;  Service: Cardiovascular;  Laterality: N/A;  . NO PAST SURGERIES      Family History  Problem Relation Age of Onset  . Diabetes Mother   .  Hyperlipidemia Father   . Hypertension Father   . Coronary artery disease Father        Stents placed  . Heart disease Maternal Grandmother   . Heart disease Paternal Grandmother     Social History   Tobacco Use  . Smoking status: Current Every Day Smoker    Packs/day: 1.00    Types: Cigarettes  . Smokeless tobacco: Never Used  Substance Use Topics  . Alcohol use: Yes    Alcohol/week: 6.0 standard drinks    Types: 6 Cans of beer per week    Comment: 6 pack per day  . Drug use: Yes    Types: Marijuana    Outpatient Medications Prior to Visit  Medication Sig Dispense Refill  . aspirin 81 MG chewable tablet Chew 1 tablet (81 mg total) by mouth  daily.    Marland Kitchen atorvastatin (LIPITOR) 80 MG tablet Take 1 tablet (80 mg total) by mouth daily at 6 PM. 90 tablet 3  . BRILINTA 90 MG TABS tablet TAKE 1 TABLET BY MOUTH 2 TIMES DAILY. 30 tablet 3  . metoprolol tartrate (LOPRESSOR) 25 MG tablet Take 0.5 tablets (12.5 mg total) by mouth 2 (two) times daily. 30 tablet 6  . omeprazole (PRILOSEC) 20 MG capsule Take 1 capsule (20 mg total) by mouth daily. 30 capsule 3  . PROVENTIL HFA 108 (90 Base) MCG/ACT inhaler INHALE 1-2 PUFFS INTO THE LUNGS EVERY 6 (SIX) HOURS AS NEEDED FOR WHEEZING OR SHORTNESS OF BREATH. 1 g 0  . nitroGLYCERIN (NITROSTAT) 0.4 MG SL tablet Place 1 tablet (0.4 mg total) under the tongue every 5 (five) minutes x 3 doses as needed for chest pain. (Patient not taking: Reported on 01/07/2019) 25 tablet 12  . doxycycline (VIBRAMYCIN) 100 MG capsule Take 1 capsule (100 mg total) by mouth 2 (two) times daily. 20 capsule 0   No facility-administered medications prior to visit.     Allergies  Allergen Reactions  . Penicillins Rash    Has patient had a PCN reaction causing immediate rash, facial/tongue/throat swelling, SOB or lightheadedness with hypotension: YES Has patient had a PCN reaction causing severe rash involving mucus membranes or skin necrosis: NO Has patient had a PCN reaction that required hospitalization: NO Has patient had a PCN reaction occurring within the last 10 years: NO If all of the above answers are "NO", then may proceed with Cephalosporin use.  . Tramadol Hcl Hives    ROS Review of Systems    Objective:    Physical Exam  Constitutional: He is oriented to person, place, and time. He appears well-developed and well-nourished.  Obese male in NAD  Neck: Normal range of motion. Neck supple. No JVD present.  Cardiovascular: Normal rate and regular rhythm.  No carotid bruit  Pulmonary/Chest: Effort normal. No respiratory distress. He has no wheezes.  Fine coarse breath sounds and decreased lung sounds in  posterior lung fields  Abdominal: Soft. There is no abdominal tenderness (no epigastric tenderness). There is no rebound and no guarding.  Truncal obesity  Musculoskeletal:        General: No tenderness or edema.  Lymphadenopathy:    He has no cervical adenopathy.  Neurological: He is alert and oriented to person, place, and time.  Skin:  Patient with a scabbed area on the right mid-shin at area of prior laceration  Psychiatric: He has a normal mood and affect. His behavior is normal.  Nursing note and vitals reviewed.   BP (!) 143/89   Pulse  86   Temp (!) 97.5 F (36.4 C) (Temporal)   Resp 17   Ht 5\' 9"  (1.753 m)   Wt 220 lb 6.4 oz (100 kg)   SpO2 97%   BMI 32.55 kg/m  Wt Readings from Last 3 Encounters:  01/07/19 220 lb 6.4 oz (100 kg)  12/11/18 220 lb (99.8 kg)  09/07/18 219 lb (99.3 kg)         Lab Results  Component Value Date   TSH 0.862 03/10/2018   Lab Results  Component Value Date   WBC 6.5 08/09/2018   HGB 14.8 08/09/2018   HCT 41.6 08/09/2018   MCV 92 08/09/2018   PLT 354 08/09/2018   Lab Results  Component Value Date   NA 139 08/09/2018   K 4.6 08/09/2018   CO2 21 08/09/2018   GLUCOSE 91 08/09/2018   BUN 8 08/09/2018   CREATININE 0.59 (L) 08/09/2018   BILITOT 0.3 08/09/2018   ALKPHOS 87 08/09/2018   AST 29 08/09/2018   ALT 32 08/09/2018   PROT 7.1 08/09/2018   ALBUMIN 4.6 08/09/2018   CALCIUM 9.4 08/09/2018   ANIONGAP 15 05/16/2018   Lab Results  Component Value Date   CHOL 124 09/07/2018   Lab Results  Component Value Date   HDL 47 09/07/2018   Lab Results  Component Value Date   LDLCALC 64 09/07/2018   Lab Results  Component Value Date   TRIG 63 09/07/2018   Lab Results  Component Value Date   CHOLHDL 2.6 09/07/2018   Lab Results  Component Value Date   HGBA1C 5.4 03/10/2018      Assessment & Plan:  1. Shortness of breath Patient with continued 1 pack/day use of cigarettes and reports some increased shortness of  breath over the weekend.  Patient will be placed on prednisone taper and doxycycline for suspected COPD exacerbation.  Patient also given refill of albuterol.  Patient is being referred to pulmonology for further evaluation and treatment.  Discussed the need for complete smoking cessation which patient feels that he would be unable to do at this time.  Discussed with patient that there are resources such as reduced cost nicotine patches available.  Patient also made aware that sometimes increased fatigue and increased shortness of breath can be signs of worsening coronary artery disease and he is also encouraged to follow-up with his cardiologist.  New referral made for cardiology follow-up.  Discussed sleep study as patient with complaint of shortness of breath, fatigue as well as occasional awakening with a sensation of gasping.  Patient denies snoring and is not interested in sleep study at this time. - doxycycline (VIBRA-TABS) 100 MG tablet; Take 1 tablet (100 mg total) by mouth 2 (two) times daily.  Dispense: 20 tablet; Refill: 0 - predniSONE (DELTASONE) 20 MG tablet; 2 pills once per day x 5 days; take after eating  Dispense: 10 tablet; Refill: 0 - Ambulatory referral to Cardiology - Ambulatory referral to Pulmonology - albuterol (VENTOLIN HFA) 108 (90 Base) MCG/ACT inhaler; Inhale 2 puffs into the lungs every 6 (six) hours as needed for wheezing or shortness of breath.  Dispense: 18 g; Refill: 5  2. Coronary artery disease with stable angina pectoris, unspecified vessel or lesion type, unspecified whether native or transplanted heart [facility](HCC) Patient with known coronary artery disease and he reports atypical chest pain this weekend in which he had pain on both the right and left sides of the front and back of his chest.  Patient does  have nitroglycerin at home but has not taken this medication since April.  He also has nitroglycerin that he takes to work and patient was asked to make sure that the  nitroglycerin does not look like it is crumbling as this could be a sign that it is no longer effective and needs to be exchanged for a new bottle of nitroglycerin.  Patient is encouraged to go to the emergency department or urgent care if he again has episodes of chest pain.  Patient reports that he will follow-up with cardiology this time.  He is to continue the use of his Brilinta and daily aspirin as well as atorvastatin for secondary prevention.  Handout on coronary artery disease as part of his AVS. - Ambulatory referral to Cardiology  3. Gastroesophageal reflux disease, esophagitis presence not specified Patient with history of acid reflux and he reports that he has had increased symptoms.  He is also on long-term anticoagulant medication which can also contribute.  Patient will have increase in dose of omeprazole to 20 mg twice daily.  Handout given on GERD diet as part of after visit summary. - omeprazole (PRILOSEC) 20 MG capsule; Take 1 capsule (20 mg total) by mouth 2 (two) times daily.  Dispense: 60 capsule; Refill: 3  4.  Encounter for examination following treatment at hospital; long-term use of anticoagulant Patient is status post ED visit on 12/11/2018 due to a right lower extremity laceration which is now healed. Patient will be contacted to have a CBC done as last in chart was done in February and patient with no anemia but no CBC done at ED visit and patient reports that he had bleeding for several hours which he attempted to stop without success prior to finally following up at the emergency department later that day  5. Tobacco use disorder Patient is being referred to pulmonology due to his ongoing issues with shortness of breath and continued 1 pack/day tobacco use.  Patient requested and was given refill of his albuterol. - Ambulatory referral to Pulmonology - albuterol (VENTOLIN HFA) 108 (90 Base) MCG/ACT inhaler; Inhale 2 puffs into the lungs every 6 (six) hours as needed for  wheezing or shortness of breath.  Dispense: 18 g; Refill: 5   Allergies as of 01/07/2019      Reactions   Penicillins Rash   Has patient had a PCN reaction causing immediate rash, facial/tongue/throat swelling, SOB or lightheadedness with hypotension: YES Has patient had a PCN reaction causing severe rash involving mucus membranes or skin necrosis: NO Has patient had a PCN reaction that required hospitalization: NO Has patient had a PCN reaction occurring within the last 10 years: NO If all of the above answers are "NO", then may proceed with Cephalosporin use.   Tramadol Hcl Hives      Medication List       Accurate as of January 07, 2019  9:52 AM. If you have any questions, ask your nurse or doctor.        STOP taking these medications   doxycycline 100 MG capsule Commonly known as: VIBRAMYCIN Replaced by: doxycycline 100 MG tablet Stopped by: Cain Saupeammie Jamarrius Salay, MD     TAKE these medications   aspirin 81 MG chewable tablet Chew 1 tablet (81 mg total) by mouth daily.   atorvastatin 80 MG tablet Commonly known as: LIPITOR Take 1 tablet (80 mg total) by mouth daily at 6 PM.   doxycycline 100 MG tablet Commonly known as: VIBRA-TABS Take 1 tablet (100 mg  total) by mouth 2 (two) times daily. Replaces: doxycycline 100 MG capsule Started by: Cain Saupeammie Haylen Shelnutt, MD   metoprolol tartrate 25 MG tablet Commonly known as: LOPRESSOR Take 0.5 tablets (12.5 mg total) by mouth 2 (two) times daily.   nitroGLYCERIN 0.4 MG SL tablet Commonly known as: NITROSTAT Place 1 tablet (0.4 mg total) under the tongue every 5 (five) minutes x 3 doses as needed for chest pain.   omeprazole 20 MG capsule Commonly known as: PRILOSEC Take 1 capsule (20 mg total) by mouth 2 (two) times daily. What changed: when to take this Changed by: Cain Saupeammie Cecil Bixby, MD   predniSONE 20 MG tablet Commonly known as: DELTASONE 2 pills once per day x 5 days; take after eating Started by: Cain Saupeammie Benjerman Molinelli, MD   Proventil HFA 108 (90  Base) MCG/ACT inhaler Generic drug: albuterol INHALE 1-2 PUFFS INTO THE LUNGS EVERY 6 (SIX) HOURS AS NEEDED FOR WHEEZING OR SHORTNESS OF BREATH. What changed: Another medication with the same name was added. Make sure you understand how and when to take each. Changed by: Cain Saupeammie Dolores Ewing, MD   albuterol 108 (90 Base) MCG/ACT inhaler Commonly known as: VENTOLIN HFA Inhale 2 puffs into the lungs every 6 (six) hours as needed for wheezing or shortness of breath. What changed: You were already taking a medication with the same name, and this prescription was added. Make sure you understand how and when to take each. Changed by: Cain Saupeammie Marciano Mundt, MD   ticagrelor 90 MG Tabs tablet Commonly known as: Brilinta Take 1 tablet (90 mg total) by mouth 2 (two) times daily. What changed: how much to take Changed by: Cain Saupeammie Keiffer Piper, MD      An After Visit Summary was printed and given to the patient.  Follow-up: Return in about 3 months (around 04/09/2019), or if symptoms worsen or fail to improve, for CAD/GERD/SOB.   Cain Saupeammie Michael Ventresca, MD

## 2019-01-07 NOTE — Patient Instructions (Signed)
Food Choices for Gastroesophageal Reflux Disease, Adult When you have gastroesophageal reflux disease (GERD), the foods you eat and your eating habits are very important. Choosing the right foods can help ease the discomfort of GERD. Consider working with a diet and nutrition specialist (dietitian) to help you make healthy food choices. What general guidelines should I follow?  Eating plan  Choose healthy foods low in fat, such as fruits, vegetables, whole grains, low-fat dairy products, and lean meat, fish, and poultry.  Eat frequent, small meals instead of three large meals each day. Eat your meals slowly, in a relaxed setting. Avoid bending over or lying down until 2-3 hours after eating.  Limit high-fat foods such as fatty meats or fried foods.  Limit your intake of oils, butter, and shortening to less than 8 teaspoons each day.  Avoid the following: ? Foods that cause symptoms. These may be different for different people. Keep a food diary to keep track of foods that cause symptoms. ? Alcohol. ? Drinking large amounts of liquid with meals. ? Eating meals during the 2-3 hours before bed.  Runde foods using methods other than frying. This may include baking, grilling, or broiling. Lifestyle  Maintain a healthy weight. Ask your health care provider what weight is healthy for you. If you need to lose weight, work with your health care provider to do so safely.  Exercise for at least 30 minutes on 5 or more days each week, or as told by your health care provider.  Avoid wearing clothes that fit tightly around your waist and chest.  Do not use any products that contain nicotine or tobacco, such as cigarettes and e-cigarettes. If you need help quitting, ask your health care provider.  Sleep with the head of your bed raised. Use a wedge under the mattress or blocks under the bed frame to raise the head of the bed. What foods are not recommended? The items listed may not be a complete  list. Talk with your dietitian about what dietary choices are best for you. Grains Pastries or quick breads with added fat. French toast. Vegetables Deep fried vegetables. French fries. Any vegetables prepared with added fat. Any vegetables that cause symptoms. For some people this may include tomatoes and tomato products, chili peppers, onions and garlic, and horseradish. Fruits Any fruits prepared with added fat. Any fruits that cause symptoms. For some people this may include citrus fruits, such as oranges, grapefruit, pineapple, and lemons. Meats and other protein foods High-fat meats, such as fatty beef or pork, hot dogs, ribs, ham, sausage, salami and bacon. Fried meat or protein, including fried fish and fried chicken. Nuts and nut butters. Dairy Whole milk and chocolate milk. Sour cream. Cream. Ice cream. Cream cheese. Milk shakes. Beverages Coffee and tea, with or without caffeine. Carbonated beverages. Sodas. Energy drinks. Fruit juice made with acidic fruits (such as orange or grapefruit). Tomato juice. Alcoholic drinks. Fats and oils Butter. Margarine. Shortening. Ghee. Sweets and desserts Chocolate and cocoa. Donuts. Seasoning and other foods Pepper. Peppermint and spearmint. Any condiments, herbs, or seasonings that cause symptoms. For some people, this may include curry, hot sauce, or vinegar-based salad dressings. Summary  When you have gastroesophageal reflux disease (GERD), food and lifestyle choices are very important to help ease the discomfort of GERD.  Eat frequent, small meals instead of three large meals each day. Eat your meals slowly, in a relaxed setting. Avoid bending over or lying down until 2-3 hours after eating.  Limit high-fat   foods such as fatty meat or fried foods. This information is not intended to replace advice given to you by your health care provider. Make sure you discuss any questions you have with your health care provider. Document Released:  06/06/2005 Document Revised: 09/27/2018 Document Reviewed: 06/07/2016 Elsevier Patient Education  2020 [employer] Inc[employer]  Coronary Artery Disease, Male Coronary artery disease (CAD) is a condition in which the arteries that lead to the heart (coronary arteries) become narrow or blocked. The narrowing or blockage can lead to decreased blood flow to the heart. Prolonged reduced blood flow can cause a heart attack (myocardial infarction or MI). This condition may also be called coronary heart disease. Because CAD is the leading cause of death in men, it is important to understand what causes this condition and how it is treated. What are the causes? CAD is most often caused by atherosclerosis. This is the buildup of fat and cholesterol (plaque) on the inside of the arteries. Over time, the plaque may narrow or block the artery, reducing blood flow to the heart. Plaque can also become weak and break off within a coronary artery and cause a sudden blockage. Other less common causes of CAD include:  A blood clot or a piece of a blood clot or other substance that blocks the flow of blood in a coronary artery (embolism).  A tearing of the artery (spontaneous coronary artery dissection).  An enlargement of an artery (aneurysm).  Inflammation (vasculitis) in the artery wall. What increases the risk? The following factors may make you more likely to develop this condition:  Age. Men over age 245 are at a greater risk of CAD.  Family history of CAD.  Gender. Men often develop CAD earlier in life than women.  High blood pressure (hypertension).  Diabetes.  High cholesterol levels.  Tobacco use.  Excessive alcohol use.  Lack of exercise.  A diet high in saturated and trans fats, such as fried food and processed meat. Other possible risk factors include:  High stress levels.  Depression.  Obesity.  Sleep apnea. What are the signs or symptoms? Many people do not have any symptoms during  the early stages of CAD. As the condition progresses, symptoms may include:  Chest pain (angina). The pain can: ? Feel like crushing or squeezing, or like a tightness, pressure, fullness, or heaviness in the chest. ? Last more than a few minutes or can stop and recur. The pain tends to get worse with exercise or stress and to fade with rest.  Pain in the arms, neck, jaw, ear, or back.  Unexplained heartburn or indigestion.  Shortness of breath.  Nausea or vomiting.  Sudden light-headedness.  Sudden cold sweats.  Fluttering or fast heartbeat (palpitations). How is this diagnosed? This condition is diagnosed based on:  Your family and medical history.  A physical exam.  Tests, including: ? A test to check the electrical signals in your heart (electrocardiogram). ? Exercise stress test. This looks for signs of blockage when the heart is stressed with exercise, such as running on a treadmill. ? Pharmacologic stress test. This test looks for signs of blockage when the heart is being stressed with a medicine. ? Blood tests. ? Coronary angiogram. This is a procedure to look at the coronary arteries to see if there is any blockage. During this test, a dye is injected into your arteries so they appear on an X-ray. ? Coronary artery CT scan. This CT scan helps detect calcium deposits in your coronary  arteries. Calcium deposits are an indicator of CAD. ? A test that uses sound waves to take a picture of your heart (echocardiogram). ? Chest X-ray. How is this treated? This condition may be treated by:  Healthy lifestyle changes to reduce risk factors.  Medicines such as: ? Antiplatelet medicines and blood-thinning medicines, such as aspirin. These help to prevent blood clots. ? Nitroglycerin. ? Blood pressure medicines. ? Cholesterol-lowering medicine.  Coronary angioplasty and stenting. During this procedure, a thin, flexible tube is inserted through a blood vessel and into a  blocked artery. A balloon or similar device on the end of the tube is inflated to open up the artery. In some cases, a small, mesh tube (stent) is inserted into the artery to keep it open.  Coronary artery bypass surgery. During this surgery, veins or arteries from other parts of the body are used to create a bypass around the blockage and allow blood to reach your heart. Follow these instructions at home: Medicines  Take over-the-counter and prescription medicines only as told by your health care provider.  Do not take the following medicines unless your health care provider approves: ? NSAIDs, such as ibuprofen, naproxen, or celecoxib. ? Vitamin supplements that contain vitamin A, vitamin E, or both. Lifestyle  Follow an exercise program approved by your health care provider. Aim for 150 minutes of moderate exercise or 75 minutes of vigorous exercise each week.  Maintain a healthy weight or lose weight as approved by your health care provider.  Learn to manage stress or try to limit your stress. Ask your health care provider for suggestions if you need help.  Get screened for depression and seek treatment, if needed.  Do not use any products that contain nicotine or tobacco, such as cigarettes, e-cigarettes, and chewing tobacco. If you need help quitting, ask your health care provider.  Do not use illegal drugs. Eating and drinking   Follow a heart-healthy diet. A dietitian can help educate you about healthy food options and changes. In general, eat plenty of fruits and vegetables, lean meats, and whole grains.  Avoid foods high in: ? Sugar. ? Salt (sodium). ? Saturated fat, such as processed or fatty meat. ? Trans fat, such as fried foods.  Use healthy cooking methods such as roasting, grilling, broiling, baking, poaching, steaming, or stir-frying.  Do not drink alcohol if your health care provider tells you not to drink.  If you drink alcohol: ? Limit how much you have to  0-2 drinks per day. ? Be aware of how much alcohol is in your drink. In the [place] one drink equals one 12 oz bottle of beer (355 mL), one 5 oz glass of wine (148 mL), or one 1 oz glass of hard liquor (44 mL). General instructions  Manage any other health conditions, such as hypertension and diabetes. These conditions affect your heart.  Your health care provider may ask you to monitor your blood pressure. Ideally, your blood pressure should be below 130/80.  Keep all follow-up visits as told by your health care provider. This is important. Get help right away if:  You have pain in your chest, neck, ear, arm, jaw, stomach, or back that: ? Lasts more than a few minutes. ? Is recurring. ? Is not relieved by taking medicine under your tongue (sublingual nitroglycerin).  You have profuse sweating without cause.  You have unexplained: ? Heartburn or indigestion. ? Shortness of breath or difficulty breathing. ? Fluttering or fast heartbeat (palpitations). ? Nausea  or vomiting. ? Fatigue. ? Feelings of nervousness or anxiety. ? Weakness. ? Diarrhea.  You have sudden light-headedness or dizziness.  You faint.  You feel like hurting yourself or think about taking your own life. These symptoms may represent a serious problem that is an emergency. Do not wait to see if the symptoms will go away. Get medical help right away. Call your local emergency services (911 in the [place] Do not drive yourself to the hospital. Summary  Coronary artery disease (CAD) is a condition in which the arteries that lead to the heart (coronary arteries) become narrow or blocked. The narrowing or blockage can lead to a heart attack.  Many people do not have any symptoms during the early stages of CAD.  CAD can be treated with lifestyle changes, medicines, surgery, or a combination of these treatments. This information is not intended to replace advice given to you by your health care provider. Make sure you  discuss any questions you have with your health care provider. Document Released: 01/01/2014 Document Revised: 02/23/2018 Document Reviewed: 02/13/2018 Elsevier Patient Education  2020 [employer]

## 2019-01-09 ENCOUNTER — Telehealth: Payer: Self-pay | Admitting: Cardiology

## 2019-01-09 ENCOUNTER — Encounter: Payer: Self-pay | Admitting: General Practice

## 2019-01-09 ENCOUNTER — Ambulatory Visit (INDEPENDENT_AMBULATORY_CARE_PROVIDER_SITE_OTHER): Payer: No Typology Code available for payment source | Admitting: General Practice

## 2019-01-09 ENCOUNTER — Other Ambulatory Visit: Payer: Self-pay

## 2019-01-09 ENCOUNTER — Encounter: Payer: Self-pay | Admitting: Cardiology

## 2019-01-09 VITALS — BP 124/82 | HR 108 | Temp 99.3°F | Ht 70.0 in | Wt 217.6 lb

## 2019-01-09 DIAGNOSIS — K219 Gastro-esophageal reflux disease without esophagitis: Secondary | ICD-10-CM

## 2019-01-09 DIAGNOSIS — I10 Essential (primary) hypertension: Secondary | ICD-10-CM

## 2019-01-09 DIAGNOSIS — R079 Chest pain, unspecified: Secondary | ICD-10-CM

## 2019-01-09 DIAGNOSIS — E7800 Pure hypercholesterolemia, unspecified: Secondary | ICD-10-CM

## 2019-01-09 DIAGNOSIS — I25118 Atherosclerotic heart disease of native coronary artery with other forms of angina pectoris: Secondary | ICD-10-CM

## 2019-01-09 NOTE — Progress Notes (Signed)
Cardiology Clinic Note   Patient Name: Dustin Olsen. Date of Encounter: 01/09/2019  Primary Care Provider:  Scot Jun, FNP Primary Cardiologist:  Kirk Ruths, MD  Patient Profile    Dustin Olsen. Dustin Olsen. 48 year old male presents today for evaluation of his chest pain and  follow-up of his coronary artery disease.  Past Medical History    Past Medical History:  Diagnosis Date   Anxiety    Bipolar disorder ([place])    CAD (coronary artery disease)    a. 03/11/18: acute STEMI s/p DES to LCx    Depression    Dyspnea    GERD (gastroesophageal reflux disease)    Past Surgical History:  Procedure Laterality Date   CORONARY/GRAFT ACUTE MI REVASCULARIZATION N/A 03/10/2018   Procedure: Coronary/Graft Acute MI Revascularization;  Surgeon: Troy Sine, MD;  Location: [facility];  Service: Cardiovascular;  Laterality: N/A;   FOOT SURGERY     LEFT HEART CATH AND CORONARY ANGIOGRAPHY N/A 03/10/2018   Procedure: LEFT HEART CATH AND CORONARY ANGIOGRAPHY;  Surgeon: Troy Sine, MD;  Location: [facility];  Service: Cardiovascular;  Laterality: N/A;   NO PAST SURGERIES      Allergies  Allergies  Allergen Reactions   Penicillins Rash    Has patient had a PCN reaction causing immediate rash, facial/tongue/throat swelling, SOB or lightheadedness with hypotension: YES Has patient had a PCN reaction causing severe rash involving mucus membranes or skin necrosis: NO Has patient had a PCN reaction that required hospitalization: NO Has patient had a PCN reaction occurring within the last 10 years: NO If all of the above answers are "NO", then may proceed with Cephalosporin use.   Tramadol Hcl Hives    History of Present Illness    Dustin Olsen was last seen by Dustin Olsen on 03/23/2018.  During that time he was having intermittent periods of mild chest discomfort that he attributed to indigestion/GERD.  He stated that he had reflux symptoms   intermittently in the past.  He denied chest pain that was similar to his MI pain and also denied syncope.  Dustin Olsen underwent cardiac catheterization 03/10/2018 which showed  80% mid circumflex and 99% mid circumflex lesions.  A DES was successfully placed treating both of these lesions.  An echocardiogram on 03/10/2018 showed preserved ejection fraction (55 to 60%), LVH, a moderately dilated left atrium, and a right ventricle with mild dilation.  His PMH also includes GERD, tobacco abuse, alcohol abuse, transaminitis, and bipolar disorder.  He presents the clinic today and states he has had substernal chest pain and scapular pain x3 days.  He states that his chest pain started Friday and went through Sunday afternoon.  He describes the pain as a persistent aching and rated at 7 out of 10.  He did not take any of his nitroglycerin but reported that the pain went away with rest.  He stated he did some yard work consisting of trimming bushes and burning, this activity did not make the chest pain increase or decrease.  He continues to take his Prilosec and states he has not noticed an increase in his reflux symptoms.  He went on to state that he did not take his blood pressure or pulse while he was experiencing chest pain.  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  He continues to smoke 1 pack/day and states that he does not do any physical activity outside of work.  He denies shortness of breath, lower extremity edema, syncope, presyncope, weakness, fatigue, hematuria, hemoptysis, orthopnea, and PND.  Home Medications    Prior to Admission medications   Medication Sig Start Date End Date Taking? Authorizing Provider  albuterol (VENTOLIN HFA) 108 (90 Base) MCG/ACT inhaler Inhale 2 puffs into the lungs every 6 (six) hours as needed for wheezing or  shortness of breath. 01/07/19   Fulp, Cammie, MD  aspirin 81 MG chewable tablet Chew 1 tablet (81 mg total) by mouth daily. 08/09/18   Bing NeighborsHarris, Kimberly S, FNP  atorvastatin (LIPITOR) 80 MG tablet Take 1 tablet (80 mg total) by mouth daily at 6 PM. 09/07/18   Bing NeighborsHarris, Kimberly S, FNP  doxycycline (VIBRA-TABS) 100 MG tablet Take 1 tablet (100 mg total) by mouth 2 (two) times daily. 01/07/19   Fulp, Cammie, MD  metoprolol tartrate (LOPRESSOR) 25 MG tablet Take 0.5 tablets (12.5 mg total) by mouth 2 (two) times daily. 09/07/18   Bing NeighborsHarris, Kimberly S, FNP  nitroGLYCERIN (NITROSTAT) 0.4 MG SL tablet Place 1 tablet (0.4 mg total) under the tongue every 5 (five) minutes x 3 doses as needed for chest pain. Patient not taking: Reported on 01/07/2019 08/09/18   Bing NeighborsHarris, Kimberly S, FNP  omeprazole (PRILOSEC) 20 MG capsule Take 1 capsule (20 mg total) by mouth 2 (two) times daily. 01/07/19   Fulp, Cammie, MD  predniSONE (DELTASONE) 20 MG tablet 2 pills once per day x 5 days; take after eating 01/07/19   Fulp, Cammie, MD  PROVENTIL HFA 108 (90 Base) MCG/ACT inhaler INHALE 1-2 PUFFS INTO THE LUNGS EVERY 6 (SIX) HOURS AS NEEDED FOR WHEEZING OR SHORTNESS OF BREATH. 12/12/18   Bing NeighborsHarris, Kimberly S, FNP  ticagrelor (BRILINTA) 90 MG TABS tablet Take 1 tablet (90 mg total) by mouth 2 (two) times daily. 01/07/19   Fulp, Hewitt Shortsammie, MD    Family History    Family History  Problem Relation Age of Onset   Diabetes Mother    Hyperlipidemia Father    Hypertension Father    Coronary artery disease Father        Stents placed   Heart disease Maternal Grandmother    Heart disease Paternal Grandmother    He indicated that the status of his mother is unknown. He indicated that the status of his father is unknown. He indicated that the status of his maternal grandmother is unknown. He indicated that the status of his paternal grandmother is unknown.  Social History    Social History   Socioeconomic History   Marital status:  Single    Spouse name: Not on file   Number of children: Not on file   Years of education: Not on file   Highest education level: Not on file  Occupational History   Not on file  Social Needs   Financial resource strain: Not on file   Food insecurity    Worry: Not on file    Inability: Not on file   Transportation needs    Medical: Not on file    Non-medical: Not on file  Tobacco Use   Smoking status: Current Every Day Smoker    Packs/day: 1.00    Types: Cigarettes   Smokeless tobacco: Never Used  Substance and Sexual Activity   Alcohol use: Yes    Alcohol/week: 6.0 standard drinks  Types: 6 Cans of beer per week    Comment: 6 pack per day   Drug use: Yes    Types: Marijuana   Sexual activity: Not on file  Lifestyle   Physical activity    Days per week: Not on file    Minutes per session: Not on file   Stress: Not on file  Relationships   Social connections    Talks on phone: Not on file    Gets together: Not on file    Attends religious service: Not on file    Active member of club or organization: Not on file    Attends meetings of clubs or organizations: Not on file    Relationship status: Not on file   Intimate partner violence    Fear of current or ex partner: Not on file    Emotionally abused: Not on file    Physically abused: Not on file    Forced sexual activity: Not on file  Other Topics Concern   Not on file  Social History Narrative   ** Merged History Encounter **         Review of Systems    General:  No chills, fever, night sweats or weight changes.  Cardiovascular:  No chest pain, dyspnea on exertion, edema, orthopnea, palpitations, paroxysmal nocturnal dyspnea. Dermatological: No rash, lesions/masses Respiratory: No cough, dyspnea Urologic: No hematuria, dysuria Abdominal:   No nausea, vomiting, diarrhea, bright red blood per rectum, melena, or hematemesis Neurologic:  No visual changes, wkns, changes in mental  status. All other systems reviewed and are otherwise negative except as noted above.  Physical Exam    VS:  BP 124/82 (BP Location: Left Arm, Patient Position: Sitting, Cuff Size: Normal)    Pulse (!) 108    Temp 99.3 F (37.4 C)    Ht 5\' 10"  (1.778 m)    Wt 217 lb 9.6 oz (98.7 kg)    SpO2 (!) 5%    BMI 31.22 kg/m  , BMI Body mass index is 31.22 kg/m. GEN: Well nourished, well developed, in no acute distress. HEENT: normal. Neck: Supple, no JVD, carotid bruits, or masses. Cardiac: RRR, no murmurs, rubs, or gallops. No clubbing, cyanosis, edema.  Radials/DP/PT 2+ and equal bilaterally.  Respiratory:  Respirations regular and unlabored, + bilateral inspiratory wheeze throughout all lung fields. GI: Soft, nontender, nondistended, BS + x 4. MS: no deformity or atrophy. Skin: warm and dry, no rash. Neuro:  Strength and sensation are intact. Psych: Normal affect.  Accessory Clinical Findings    ECG personally reviewed by me today-sinus tachycardia 108 bpm no ST deviation- No acute changes  EKG 05/17/2018: Normal sinus rhythm abnormal R wave progression, borderline QT prolongation, no acute changes 91 bpm  EKG 04/10/2018: Normal sinus rhythm 89 bpm  EKG 03/20/2018: Normal sinus rhythm 90 bpm  Echocardiogram 03/10/2018: Left ventricle: The cavity size was normal. Wall thickness was increased in a pattern of moderate LVH. Systolic function was normal. The estimated ejection fraction was in the range of 55%  to 60%. Wall motion was normal; there were no regional wall motion abnormalities. - Left atrium: The atrium was moderately dilated. - Right ventricle: The cavity size was mildly dilated. Wall thickness was normal.  Cardiac catheterization: 03/10/2018  Prox RCA to Mid RCA lesion is 10% stenosed.  Dist RCA lesion is 20% stenosed.  Prox Cx to Mid Cx lesion is 80% stenosed.  Mid Cx lesion is 99% stenosed.  A stent was successfully placed.  Post intervention, there is a 0%  residual stenosis.   Post intervention, there is a 0% residual stenosis.  The left ventricular systolic function is normal.  LV end diastolic pressure is normal. Assessment & Plan   1.  Coronary artery disease- chest pain x3 days, Friday through Sunday, no chest pain at current time. cardiac catheterization 03/10/2018 which showed  80% mid circumflex and 99% mid circumflex lesions.  A DES was successfully placed treating both of these lesions.  An echocardiogram on 03/10/2018 showed preserved ejection fraction (55 to 60%), LVH, a moderately dilated left atrium, and a right ventricle with mild dilation. Continue 81 mg aspirin daily Continue ticagrelor 90 mg tablet twice daily Heart healthy low-sodium diet Increase physical activity as tolerated Order Lexiscan Myoview/nuclear stress test  2.  Pure hypercholesterolemia-03/10/2018: VLDL 17 09/07/2018: Cholesterol, Total 124; HDL 47; LDL Calculated 64; Triglycerides 63 Continue atorvastatin 80 milligrams tablet daily Heart healthy low-sodium diet Increase physical activity as tolerated Monitored by primary care provider  3.  Essential hypertension- well-controlled(124/82) Continue metoprolol tartrate 12.5 mg tablet twice daily Heart healthy low-sodium diet Increase physical activity as tolerated Maintain blood pressure log and bring to next appointment.  4.  GERD- intermittent symptoms Continue omeprazole 20 mg tablet twice daily Encouraged weight loss Avoid spicy foods and foods that precipitate reflux symptoms Recommend slight head of bed elevation Follow-up with PCP if symptoms persist and for further GERD management.  Disposition: Follow-up with Dr. Jens Somrenshaw after nuclear stress test.  Dustin AstersJesse M Chandani Rogowski, NP 01/09/2019, 3:20 PM

## 2019-01-09 NOTE — Patient Instructions (Signed)
Medication Instructions:  Your physician recommends that you continue on your current medications as directed. Please refer to the Current Medication list given to you today. If you need a refill on your cardiac medications before your next appointment, please call your pharmacy.   Lab work: NONE If you have labs (blood work) drawn today and your tests are completely normal, you will receive your results only by: Marland Kitchen MyChart Message (if you have MyChart) OR . A paper copy in the mail If you have any lab test that is abnormal or we need to change your treatment, we will call you to review the results.  Testing/Procedures: Your physician has requested that you have a lexiscan myoview. For further information please visit [email or web address]. Please follow instruction sheet, as given.  Follow-Up: At [facility], you and your health needs are our priority.  As part of our continuing mission to provide you with exceptional heart care, we have created designated Provider Care Teams.  These Care Teams include your primary Cardiologist (physician) and Advanced Practice Providers (APPs -  Physician Assistants and Nurse Practitioners) who all work together to provide you with the care you need, when you need it. You will need a follow up appointment AFTER LEXISCAN. You may see Kirk Ruths, MD or one of the following Advanced Practice Providers on your designated Care Team:   Kerin Ransom, PA-C Roby Lofts, [place] . Sande Rives, PA-C  Any Other Special Instructions Will Be Listed Below (If Applicable).

## 2019-01-09 NOTE — Telephone Encounter (Signed)
Dustin Olsen, reminding pt of his appt for 01-09-19 with Kerin Ransom. I asked pt to call back to be pre-screened for COVID-19.

## 2019-01-16 ENCOUNTER — Telehealth (HOSPITAL_COMMUNITY): Payer: Self-pay | Admitting: *Deleted

## 2019-01-16 NOTE — Telephone Encounter (Signed)
Close encounter 

## 2019-01-18 ENCOUNTER — Ambulatory Visit (HOSPITAL_COMMUNITY)
Admission: RE | Admit: 2019-01-18 | Discharge: 2019-01-18 | Disposition: A | Discharge status: Home / Self Care | Payer: No Typology Code available for payment source | Source: Ambulatory Visit | Attending: Cardiology | Admitting: Cardiology

## 2019-01-18 ENCOUNTER — Other Ambulatory Visit: Payer: Self-pay

## 2019-01-18 DIAGNOSIS — R079 Chest pain, unspecified: Secondary | ICD-10-CM

## 2019-01-18 DIAGNOSIS — I25118 Atherosclerotic heart disease of native coronary artery with other forms of angina pectoris: Secondary | ICD-10-CM

## 2019-01-18 LAB — MYOCARDIAL PERFUSION IMAGING
LV dias vol: 129 mL (ref 62–150)
LV sys vol: 64 mL
Peak HR: 110 {beats}/min
Rest HR: 79 {beats}/min
SDS: 1
SRS: 0
SSS: 1
TID: 1.14

## 2019-01-18 MED ORDER — AMINOPHYLLINE 25 MG/ML IV SOLN
75.0000 mg | Freq: Once | INTRAVENOUS | Status: AC
  Administered 2019-01-18: 75 mg via INTRAVENOUS

## 2019-01-18 MED ORDER — TECHNETIUM TC 99M TETROFOSMIN IV KIT
31.2000 | PACK | Freq: Once | INTRAVENOUS | Status: AC | PRN
  Administered 2019-01-18: 31.2 via INTRAVENOUS
  Filled 2019-01-18: qty 32

## 2019-01-18 MED ORDER — TECHNETIUM TC 99M TETROFOSMIN IV KIT
10.4000 | PACK | Freq: Once | INTRAVENOUS | Status: AC | PRN
  Administered 2019-01-18: 10.4 via INTRAVENOUS
  Filled 2019-01-18: qty 11

## 2019-01-18 MED ORDER — REGADENOSON 0.4 MG/5ML IV SOLN
0.4000 mg | Freq: Once | INTRAVENOUS | Status: AC
  Administered 2019-01-18: 0.4 mg via INTRAVENOUS

## 2019-01-21 ENCOUNTER — Telehealth (INDEPENDENT_AMBULATORY_CARE_PROVIDER_SITE_OTHER): Payer: No Typology Code available for payment source | Admitting: General Practice

## 2019-01-21 DIAGNOSIS — I25118 Atherosclerotic heart disease of native coronary artery with other forms of angina pectoris: Secondary | ICD-10-CM

## 2019-01-21 NOTE — Telephone Encounter (Signed)
Patient has been notified directly and voiced understanding.   

## 2019-01-21 NOTE — Telephone Encounter (Signed)
Left patient a message with normal stress test results. Advised patient to contact the office with any questions or concerns.

## 2019-01-21 NOTE — Telephone Encounter (Signed)
Pt called at home. Voice message was left. Mr. Mabey myocardial perfusion study was low risk.   Normal perfusion No ischemia or scar  Nuclear stress EF: 50%.  This is a low risk study.  No further testing needed at this time.  Follow-up with PCP if indigestion/GI discomfort continues.  Jossie Ng. Nivaan Dicenzo, NP-C 01/21/2019, 7:35 AM

## 2019-02-07 ENCOUNTER — Ambulatory Visit: Payer: Self-pay | Admitting: Critical Care Medicine

## 2019-02-18 NOTE — Progress Notes (Deleted)
HPI: Follow-up coronary artery disease.  Cardiac catheterization 9/19 revealed an 80% mid circumflex followed by 99% lesion.  Patient had PCI with a drug-eluting stent.  LV function was normal.  Echocardiogram showed normal LV function, moderate left atrial enlargement and mild right ventricular enlargement.  In reviewing records there is a CT scan from January 2019 showed a 2.2 cm lesion in the left kidney suspicious for neoplasm.  Dedicated abdominal MRI was recommended. Nuclear study July 2020 showed ejection fraction 50% and no ischemia or infarction.  Since   Current Outpatient Medications  Medication Sig Dispense Refill  . albuterol (VENTOLIN HFA) 108 (90 Base) MCG/ACT inhaler Inhale 2 puffs into the lungs every 6 (six) hours as needed for wheezing or shortness of breath. 18 g 5  . aspirin 81 MG chewable tablet Chew 1 tablet (81 mg total) by mouth daily.    Marland Kitchen. atorvastatin (LIPITOR) 80 MG tablet Take 1 tablet (80 mg total) by mouth daily at 6 PM. 90 tablet 3  . doxycycline (VIBRA-TABS) 100 MG tablet Take 1 tablet (100 mg total) by mouth 2 (two) times daily. 20 tablet 0  . metoprolol tartrate (LOPRESSOR) 25 MG tablet Take 0.5 tablets (12.5 mg total) by mouth 2 (two) times daily. 30 tablet 6  . nitroGLYCERIN (NITROSTAT) 0.4 MG SL tablet Place 1 tablet (0.4 mg total) under the tongue every 5 (five) minutes x 3 doses as needed for chest pain. 25 tablet 12  . omeprazole (PRILOSEC) 20 MG capsule Take 1 capsule (20 mg total) by mouth 2 (two) times daily. 60 capsule 3  . predniSONE (DELTASONE) 20 MG tablet 2 pills once per day x 5 days; take after eating 10 tablet 0  . PROVENTIL HFA 108 (90 Base) MCG/ACT inhaler INHALE 1-2 PUFFS INTO THE LUNGS EVERY 6 (SIX) HOURS AS NEEDED FOR WHEEZING OR SHORTNESS OF BREATH. 1 g 0  . ticagrelor (BRILINTA) 90 MG TABS tablet Take 1 tablet (90 mg total) by mouth 2 (two) times daily. 60 tablet 5   No current facility-administered medications for this visit.       Past Medical History:  Diagnosis Date  . Anxiety   . Bipolar disorder (HCC)   . CAD (coronary artery disease)    a. 03/11/18: acute STEMI s/p DES to LCx   . Depression   . Dyspnea   . GERD (gastroesophageal reflux disease)     Past Surgical History:  Procedure Laterality Date  . CORONARY/GRAFT ACUTE MI REVASCULARIZATION N/A 03/10/2018   Procedure: Coronary/Graft Acute MI Revascularization;  Surgeon: Lennette BihariKelly, Thomas A, MD;  [facility];  Service: Cardiovascular;  Laterality: N/A;  . FOOT SURGERY    . LEFT HEART CATH AND CORONARY ANGIOGRAPHY N/A 03/10/2018   Procedure: LEFT HEART CATH AND CORONARY ANGIOGRAPHY;  Surgeon: Lennette BihariKelly, Thomas A, MD;  Location: [facility] INVASIVE CV LAB;  Service: Cardiovascular;  Laterality: N/A;  . NO PAST SURGERIES      Social History   Socioeconomic History  . Marital status: Single    Spouse name: Not on file  . Number of children: Not on file  . Years of education: Not on file  . Highest education level: Not on file  Occupational History  . Not on file  Social Needs  . Financial resource strain: Not on file  . Food insecurity    Worry: Not on file    Inability: Not on file  . Transportation needs    Medical: Not on file  Non-medical: Not on file  Tobacco Use  . Smoking status: Current Every Day Smoker    Packs/day: 1.00    Types: Cigarettes  . Smokeless tobacco: Never Used  Substance and Sexual Activity  . Alcohol use: Yes    Alcohol/week: 6.0 standard drinks    Types: 6 Cans of beer per week    Comment: 6 pack per day  . Drug use: Yes    Types: Marijuana  . Sexual activity: Not on file  Lifestyle  . Physical activity    Days per week: Not on file    Minutes per session: Not on file  . Stress: Not on file  Relationships  . Social Herbalist on phone: Not on file    Gets together: Not on file    Attends religious service: Not on file    Active member of club or organization: Not on file    Attends meetings  of clubs or organizations: Not on file    Relationship status: Not on file  . Intimate partner violence    Fear of current or ex partner: Not on file    Emotionally abused: Not on file    Physically abused: Not on file    Forced sexual activity: Not on file  Other Topics Concern  . Not on file  Social History Narrative   ** Merged History Encounter **        Family History  Problem Relation Age of Onset  . Diabetes Mother   . Hyperlipidemia Father   . Hypertension Father   . Coronary artery disease Father        Stents placed  . Heart disease Maternal Grandmother   . Heart disease Paternal Grandmother     ROS: no fevers or chills, productive cough, hemoptysis, dysphasia, odynophagia, melena, hematochezia, dysuria, hematuria, rash, seizure activity, orthopnea, PND, pedal edema, claudication. Remaining systems are negative.  Physical Exam: Well-developed well-nourished in no acute distress.  Skin is warm and dry.  HEENT is normal.  Neck is supple.  Chest is clear to auscultation with normal expansion.  Cardiovascular exam is regular rate and rhythm.  Abdominal exam nontender or distended. No masses palpated. Extremities show no edema. neuro grossly intact  ECG- personally reviewed  A/P  1 coronary artery disease-continue aspirin, statin and Brilinta.  2 hyperlipidemia-continue statin.  3 history of renal mass-as outlined previously we arranged abdominal MRI but this was not performed.  We will rearrange.  4 tobacco abuse-patient counseled on discontinuing.  5 bipolar disorder  6 alcohol abuse-patient counseled on discontinuing.  Kirk Ruths, MD

## 2019-02-19 ENCOUNTER — Ambulatory Visit: Payer: Self-pay | Admitting: Cardiology

## 2019-02-20 ENCOUNTER — Telehealth: Payer: Self-pay

## 2019-02-20 NOTE — Telephone Encounter (Signed)
Called patient to do their pre-visit COVID screening.  Call went to voicemail. Unable to do prescreening.  

## 2019-02-21 ENCOUNTER — Ambulatory Visit: Payer: Self-pay

## 2019-02-21 MED FILL — $BRILINTA 90 MG TABLET: 90 | 30 days supply | Qty: 60 | Fill #1

## 2019-02-21 MED FILL — ?ATORVASTATIN 40MG TABLET: 40 | 30 days supply | Qty: 60 | Fill #3

## 2019-02-21 NOTE — Progress Notes (Deleted)
Subjective:    Patient ID: Dustin DraftFloyd T Centner Jr., male    DOB: 04/05/1971, 48 y.o.   MRN: [id number]  Dustin DraftFloyd T Zimmermann Jr. presents for follow-up of chronic medical issues including coronary artery disease, hyperlipidemia, GERD, and long-term use of anticoagulant medication.  Patient also continues to smoke approximately 1 pack/day of cigarettes and continues to have issues with shortness of breath.  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  He states that he attempted to stop the bleeding himself including trying to use electrical tape around the area but the bleeding would not stop.  He thinks that it may have been 6 to 8 hours between the time when he cut himself and when he finally went to the emergency department for further evaluation.      He reports that he has not followed up with his cardiologist in some time.  He does have issues with recurrent chest pain.  He states that this weekend he felt as if he was having both left and right-sided chest pain as well as pain in his left and right upper back and right side of the neck.  He states that this did not feel typical of his pain with prior MI but he has had some episodes of left-sided chest pressure.  He has not taken nitroglycerin for chest pain since about April.  He reports that he is compliant with his daily aspirin, Lipitor and Brilinta.  He does not believe that the Lipitor is causing any increase in muscle or joint pain.  Before his recent cut on his shin, he had not had any issues with unusual bruising or bleeding.  He does continue to feel short of breath especially with activity and has had some increase in fatigue.  He continues to smoke 1 pack/day of cigarettes and would like to have a refill of albuterol at today's visit.  He does not believe that he is able to stop smoking at this  time.  Patient denies issues with daytime fatigue other than the fatigue he feels is related to work, morning headaches and he does not believe that he snores other than when he is congested.  He does report an increase in nausea and reflux type symptoms.  He is currently on omeprazole 20 mg daily.  Shortness of breath Patient with continued 1 pack/day use of cigarettes and reports some increased shortness of breath over the weekend.  Patient will be placed on prednisone taper and doxycycline for suspected COPD exacerbation.  Patient also given refill of albuterol.  Patient is being referred to pulmonology for further evaluation and treatment.  Discussed the need for complete smoking cessation which patient feels that he would be unable to do at this time.  Discussed with patient that there are resources such as reduced cost nicotine patches available.  Patient also made aware that sometimes increased fatigue and increased shortness of breath can be signs of worsening coronary artery disease and he is also encouraged to follow-up with his cardiologist.  New referral made for cardiology follow-up.  Discussed sleep study as patient with complaint of shortness of breath, fatigue as well as occasional awakening with a sensation of gasping.  Patient denies snoring and is not interested in sleep study at this time. - doxycycline (VIBRA-TABS) 100 MG tablet; Take 1 tablet (100 mg  total) by mouth 2 (two) times daily.  Dispense: 20 tablet; Refill: 0 - predniSONE (DELTASONE) 20 MG tablet; 2 pills once per day x 5 days; take after eating  Dispense: 10 tablet; Refill: 0 - Ambulatory referral to Cardiology - Ambulatory referral to Pulmonology - albuterol (VENTOLIN HFA) 108 (90 Base) MCG/ACT inhaler; Inhale 2 puffs into the lungs every 6 (six) hours as needed for wheezing or shortness of breath.  Dispense: 18 g; Refill: 5  2. Coronary artery disease with stable angina pectoris, unspecified vessel or lesion type,  unspecified whether native or transplanted heart [facility]) Patient with known coronary artery disease and he reports atypical chest pain this weekend in which he had pain on both the right and left sides of the front and back of his chest.  Patient does have nitroglycerin at home but has not taken this medication since April.  He also has nitroglycerin that he takes to work and patient was asked to make sure that the nitroglycerin does not look like it is crumbling as this could be a sign that it is no longer effective and needs to be exchanged for a new bottle of nitroglycerin.  Patient is encouraged to go to the emergency department or urgent care if he again has episodes of chest pain.  Patient reports that he will follow-up with cardiology this time.  He is to continue the use of his Brilinta and daily aspirin as well as atorvastatin for secondary prevention.  Handout on coronary artery disease as part of his AVS. - Ambulatory referral to Cardiology  3. Gastroesophageal reflux disease, esophagitis presence not specified Patient with history of acid reflux and he reports that he has had increased symptoms.  He is also on long-term anticoagulant medication which can also contribute.  Patient will have increase in dose of omeprazole to 20 mg twice daily.  Handout given on GERD diet as part of after visit summary. - omeprazole (PRILOSEC) 20 MG capsule; Take 1 capsule (20 mg total) by mouth 2 (two) times daily.  Dispense: 60 capsule; Refill: 3  4.  Encounter for examination following treatment at hospital; long-term use of anticoagulant Patient is status post ED visit on 12/11/2018 due to a right lower extremity laceration which is now healed. Patient will be contacted to have a CBC done as last in chart was done in February and patient with no anemia but no CBC done at ED visit and patient reports that he had bleeding for several hours which he attempted to stop without success prior to finally following up at  the emergency department later that day  5. Tobacco use disorder Patient is being referred to pulmonology due to his ongoing issues with shortness of breath and continued 1 pack/day tobacco use.  Patient requested and was given refill of his albuterol. - Ambulatory referral to Pulmonology - albuterol (VENTOLIN HFA) 108 (90 Base) MCG/ACT inhaler; Inhale 2 puffs into the lungs every 6 (six) hours as needed for wheezing or shortness of breath.  Dispense: 18 g; Refill: 5   Pulm eval on ref from PCP     Review of Systems     Objective:   Physical Exam        Assessment & Plan:

## 2019-03-13 ENCOUNTER — Telehealth: Payer: Self-pay

## 2019-03-13 NOTE — Telephone Encounter (Signed)
Called patient to do their pre-visit COVID screening.  Call went to voicemail. Unable to do prescreening.  

## 2019-03-14 ENCOUNTER — Ambulatory Visit: Payer: Self-pay

## 2019-03-14 NOTE — Progress Notes (Deleted)
Subjective:    Patient ID: Dustin Olsen., male    DOB: Dec 08, 1970, 48 y.o.   MRN: [id number]  HPI Dustin Olsen. presents for follow-up of chronic medical issues including coronary artery disease, hyperlipidemia, GERD, and long-term use of anticoagulant medication.  Patient also continues to smoke approximately 1 pack/day of cigarettes and continues to have issues with shortness of breath.  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  He states that he attempted to stop the bleeding himself including trying to use electrical tape around the area but the bleeding would not stop.  He thinks that it may have been 6 to 8 hours between the time when he cut himself and when he finally went to the emergency department for further evaluation.      He reports that he has not followed up with his cardiologist in some time.  He does have issues with recurrent chest pain.  He states that this weekend he felt as if he was having both left and right-sided chest pain as well as pain in his left and right upper back and right side of the neck.  He states that this did not feel typical of his pain with prior MI but he has had some episodes of left-sided chest pressure.  He has not taken nitroglycerin for chest pain since about April.  He reports that he is compliant with his daily aspirin, Lipitor and Brilinta.  He does not believe that the Lipitor is causing any increase in muscle or joint pain.  Before his recent cut on his shin, he had not had any issues with unusual bruising or bleeding.  He does continue to feel short of breath especially with activity and has had some increase in fatigue.  He continues to smoke 1 pack/day of cigarettes and would like to have a refill of albuterol at today's visit.  He does not believe that he is able to stop smoking at  this time.  Patient denies issues with daytime fatigue other than the fatigue he feels is related to work, morning headaches and he does not believe that he snores other than when he is congested.  He does report an increase in nausea and reflux type symptoms.  He is currently on omeprazole 20 mg daily. Shortness of breath Patient with continued 1 pack/day use of cigarettes and reports some increased shortness of breath over the weekend.  Patient will be placed on prednisone taper and doxycycline for suspected COPD exacerbation.  Patient also given refill of albuterol.  Patient is being referred to pulmonology for further evaluation and treatment.  Discussed the need for complete smoking cessation which patient feels that he would be unable to do at this time.  Discussed with patient that there are resources such as reduced cost nicotine patches available.  Patient also made aware that sometimes increased fatigue and increased shortness of breath can be signs of worsening coronary artery disease and he is also encouraged to follow-up with his cardiologist.  New referral made for cardiology follow-up.  Discussed sleep study as patient with complaint of shortness of breath, fatigue as well as occasional awakening with a sensation of gasping.  Patient denies snoring and is not interested in sleep study at this time. - doxycycline (VIBRA-TABS) 100 MG tablet; Take 1 tablet (100 mg  total) by mouth 2 (two) times daily.  Dispense: 20 tablet; Refill: 0 - predniSONE (DELTASONE) 20 MG tablet; 2 pills once per day x 5 days; take after eating  Dispense: 10 tablet; Refill: 0 - Ambulatory referral to Cardiology - Ambulatory referral to Pulmonology - albuterol (VENTOLIN HFA) 108 (90 Base) MCG/ACT inhaler; Inhale 2 puffs into the lungs every 6 (six) hours as needed for wheezing or shortness of breath.  Dispense: 18 g; Refill: 5  2. Coronary artery disease with stable angina pectoris, unspecified vessel or lesion type,  unspecified whether native or transplanted heart [facility](HCC) Patient with known coronary artery disease and he reports atypical chest pain this weekend in which he had pain on both the right and left sides of the front and back of his chest.  Patient does have nitroglycerin at home but has not taken this medication since April.  He also has nitroglycerin that he takes to work and patient was asked to make sure that the nitroglycerin does not look like it is crumbling as this could be a sign that it is no longer effective and needs to be exchanged for a new bottle of nitroglycerin.  Patient is encouraged to go to the emergency department or urgent care if he again has episodes of chest pain.  Patient reports that he will follow-up with cardiology this time.  He is to continue the use of his Brilinta and daily aspirin as well as atorvastatin for secondary prevention.  Handout on coronary artery disease as part of his AVS. - Ambulatory referral to Cardiology  3. Gastroesophageal reflux disease, esophagitis presence not specified Patient with history of acid reflux and he reports that he has had increased symptoms.  He is also on long-term anticoagulant medication which can also contribute.  Patient will have increase in dose of omeprazole to 20 mg twice daily.  Handout given on GERD diet as part of after visit summary. - omeprazole (PRILOSEC) 20 MG capsule; Take 1 capsule (20 mg total) by mouth 2 (two) times daily.  Dispense: 60 capsule; Refill: 3  4.  Encounter for examination following treatment at hospital; long-term use of anticoagulant Patient is status post ED visit on 12/11/2018 due to a right lower extremity laceration which is now healed. Patient will be contacted to have a CBC done as last in chart was done in February and patient with no anemia but no CBC done at ED visit and patient reports that he had bleeding for several hours which he attempted to stop without success prior to finally following up at  the emergency department later that day  5. Tobacco use disorder Patient is being referred to pulmonology due to his ongoing issues with shortness of breath and continued 1 pack/day tobacco use.  Patient requested and was given refill of his albuterol. - Ambulatory referral to Pulmonology - albuterol (VENTOLIN HFA) 108 (90 Base) MCG/ACT inhaler; Inhale 2 puffs into the lungs every 6 (six) hours as needed for wheezing or shortness of breath.  Dispense: 18 g; Refill: 5     Review of Systems Constitutional:   No  weight loss, night sweats,  Fevers, chills, fatigue, lassitude. HEENT:   No headaches,  Difficulty swallowing,  Tooth/dental problems,  Sore throat,                No sneezing, itching, ear ache, nasal congestion, post nasal drip,   CV:  No chest pain,  Orthopnea, PND, swelling in lower extremities, anasarca, dizziness, palpitations  GI  No heartburn, indigestion, abdominal pain, nausea, vomiting, diarrhea, change in bowel habits, loss of appetite  Resp: No shortness of breath with exertion or at rest.  No excess mucus, no productive cough,  No non-productive cough,  No coughing up of blood.  No change in color of mucus.  No wheezing.  No chest wall deformity  Skin: no rash or lesions.  GU: no dysuria, change in color of urine, no urgency or frequency.  No flank pain.  MS:  No joint pain or swelling.  No decreased range of motion.  No back pain.  Psych:  No change in mood or affect. No depression or anxiety.  No memory loss.     Objective:   Physical Exam There were no vitals filed for this visit.  Gen: Pleasant, well-nourished, in no distress,  normal affect  ENT: No lesions,  mouth clear,  oropharynx clear, no postnasal drip  Neck: No JVD, no TMG, no carotid bruits  Lungs: No use of accessory muscles, no dullness to percussion, clear without rales or rhonchi  Cardiovascular: RRR, heart sounds normal, no murmur or gallops, no peripheral edema  Abdomen: soft and NT,  no HSM,  BS normal  Musculoskeletal: No deformities, no cyanosis or clubbing  Neuro: alert, non focal  Skin: Warm, no lesions or rashes  No results found.        Assessment & Plan:  I personally reviewed all images and lab data in the [facility] system as well as any outside material available during this office visit and agree with the  radiology impressions.   No problem-specific Assessment & Plan notes found for this encounter.   There are no diagnoses linked to this encounter.  I had an extended discussion with the patient and or family lasting *** minutes of a 25 minute visit including:  ***

## 2019-04-10 ENCOUNTER — Other Ambulatory Visit: Payer: Self-pay

## 2019-04-10 ENCOUNTER — Other Ambulatory Visit: Payer: Self-pay | Admitting: Nurse Practitioner

## 2019-04-10 ENCOUNTER — Ambulatory Visit (INDEPENDENT_AMBULATORY_CARE_PROVIDER_SITE_OTHER): Payer: Self-pay | Admitting: Nurse Practitioner

## 2019-04-10 VITALS — BP 132/87 | HR 78 | Temp 97.5°F | Resp 18 | Wt 228.0 lb

## 2019-04-10 DIAGNOSIS — I10 Essential (primary) hypertension: Secondary | ICD-10-CM

## 2019-04-10 DIAGNOSIS — Z23 Encounter for immunization: Secondary | ICD-10-CM

## 2019-04-10 DIAGNOSIS — I25118 Atherosclerotic heart disease of native coronary artery with other forms of angina pectoris: Secondary | ICD-10-CM

## 2019-04-10 DIAGNOSIS — R7401 Elevation of levels of liver transaminase levels: Secondary | ICD-10-CM

## 2019-04-10 DIAGNOSIS — Z130 Encounter for screening for diseases of the blood and blood-forming organs and certain disorders involving the immune mechanism: Secondary | ICD-10-CM

## 2019-04-10 MED ORDER — METOPROLOL TARTRATE 25 MG PO TABS: 12.5000 mg | ORAL_TABLET | Freq: Two times a day (BID) | ORAL | 0 refills | Status: DC

## 2019-04-10 MED FILL — METOPROLOL TARTRATE 25 MG T: 25 | 30 days supply | Qty: 30 | Fill #0

## 2019-04-10 NOTE — Progress Notes (Signed)
Assessment & Plan:  Dustin Olsen was seen today for medical management of chronic issues.  Diagnoses and all orders for this visit:  Essential hypertension -     metoprolol tartrate (LOPRESSOR) 25 MG tablet; Take 0.5 tablets (12.5 mg total) by mouth 2 (two) times daily.  Transaminitis -     CMP14+EGFR  Coronary artery disease with stable angina pectoris, unspecified vessel or lesion type, unspecified whether native or transplanted heart (HCC) -     Lipid panel  Needs flu shot -     Flu Vaccine QUAD 6+ mos PF IM (Fluarix Quad PF)  Screening for deficiency anemia -     CBC    Patient has been counseled on age-appropriate routine health concerns for screening and prevention. These are reviewed and up-to-date. Referrals have been placed accordingly. Immunizations are up-to-date or declined.    Subjective:   Chief Complaint  Patient presents with  . Medical Management of Chronic Issues   HPI Dustin Olsen. 48 y.o. male presents to office today for follow up.  has a past medical history of Anxiety, Bipolar disorder ([place]), CAD (coronary artery disease), Depression, Dyspnea, and GERD (gastroesophageal reflux disease).    Essential Hypertension Blood pressure is well controlled today and he endorses medication compliance taking Lopressor 12.5 mg twice daily.  He does not monitor his blood pressure at home.  He is not consistent with DASH diet or exercise and he continues to smoke cigarettes.  Does not desire to quit at this time.  Denies chest pain, shortness of breath, palpitations, lightheadedness, dizziness, headaches or BLE edema.  Recent nuclear stress test was normal per cardiology. BP Readings from Last 3 Encounters:  04/10/19 132/87  01/09/19 124/82  01/07/19 (!) 143/89   Hyperlipidemia Patient presents for follow up to hyperlipidemia.  He is medication compliant taking atorvastatin 80 mg daily. He is not consistently diet compliant and denies  statin intolerance including  myalgias.  LDL at goal of less than 100. Lab Results  Component Value Date   CHOL 124 09/07/2018   Lab Results  Component Value Date   HDL 47 09/07/2018   Lab Results  Component Value Date   LDLCALC 64 09/07/2018   Lab Results  Component Value Date   TRIG 63 09/07/2018   Lab Results  Component Value Date   CHOLHDL 2.6 09/07/2018     GERD Symptoms are well controlled with taking omeprazole 20 mg twice daily. Review of Systems  Constitutional: Negative for fever, malaise/fatigue and weight loss.  HENT: Negative.  Negative for nosebleeds.   Eyes: Negative.  Negative for blurred vision, double vision and photophobia.  Respiratory: Negative.  Negative for cough and shortness of breath.   Cardiovascular: Negative.  Negative for chest pain, palpitations and leg swelling.  Gastrointestinal: Negative.  Negative for heartburn, nausea and vomiting.  Musculoskeletal: Negative.  Negative for myalgias.  Neurological: Negative.  Negative for dizziness, focal weakness, seizures and headaches.  Psychiatric/Behavioral: Negative.  Negative for suicidal ideas.    Past Medical History:  Diagnosis Date  . Anxiety   . Bipolar disorder ([place])   . CAD (coronary artery disease)    a. 03/11/18: acute STEMI s/p DES to LCx   . Depression   . Dyspnea   . GERD (gastroesophageal reflux disease)     Past Surgical History:  Procedure Laterality Date  . CORONARY/GRAFT ACUTE MI REVASCULARIZATION N/A 03/10/2018   Procedure: Coronary/Graft Acute MI Revascularization;  Surgeon: Troy Sine, MD;  Location: [facility];  Service: Cardiovascular;  Laterality: N/A;  . FOOT SURGERY    . LEFT HEART CATH AND CORONARY ANGIOGRAPHY N/A 03/10/2018   Procedure: LEFT HEART CATH AND CORONARY ANGIOGRAPHY;  Surgeon: Troy Sine, MD;  Location: [facility];  Service: Cardiovascular;  Laterality: N/A;  . NO PAST SURGERIES      Family History  Problem Relation Age of Onset  . Diabetes Mother   .  Hyperlipidemia Father   . Hypertension Father   . Coronary artery disease Father        Stents placed  . Heart disease Maternal Grandmother   . Heart disease Paternal Grandmother     Social History Reviewed with no changes to be made today.   Outpatient Medications Prior to Visit  Medication Sig Dispense Refill  . albuterol (VENTOLIN HFA) 108 (90 Base) MCG/ACT inhaler Inhale 2 puffs into the lungs every 6 (six) hours as needed for wheezing or shortness of breath. 18 g 5  . aspirin 81 MG chewable tablet Chew 1 tablet (81 mg total) by mouth daily.    Marland Kitchen atorvastatin (LIPITOR) 80 MG tablet Take 1 tablet (80 mg total) by mouth daily at 6 PM. 90 tablet 3  . nitroGLYCERIN (NITROSTAT) 0.4 MG SL tablet Place 1 tablet (0.4 mg total) under the tongue every 5 (five) minutes x 3 doses as needed for chest pain. 25 tablet 12  . omeprazole (PRILOSEC) 20 MG capsule Take 1 capsule (20 mg total) by mouth 2 (two) times daily. 60 capsule 3  . ticagrelor (BRILINTA) 90 MG TABS tablet Take 1 tablet (90 mg total) by mouth 2 (two) times daily. 60 tablet 5  . metoprolol tartrate (LOPRESSOR) 25 MG tablet Take 0.5 tablets (12.5 mg total) by mouth 2 (two) times daily. 30 tablet 6   No facility-administered medications prior to visit.     Allergies  Allergen Reactions  . Penicillins Rash    Has patient had a PCN reaction causing immediate rash, facial/tongue/throat swelling, SOB or lightheadedness with hypotension: YES Has patient had a PCN reaction causing severe rash involving mucus membranes or skin necrosis: NO Has patient had a PCN reaction that required hospitalization: NO Has patient had a PCN reaction occurring within the last 10 years: NO If all of the above answers are "NO", then may proceed with Cephalosporin use.  . Tramadol Hcl Hives       Objective:    BP 132/87   Pulse 78   Temp (!) 97.5 F (36.4 C) (Temporal)   Resp 18   Wt 228 lb (103.4 kg)   SpO2 96%   BMI 32.71 kg/m  Wt Readings  from Last 3 Encounters:  04/10/19 228 lb (103.4 kg)  01/18/19 217 lb (98.4 kg)  01/09/19 217 lb 9.6 oz (98.7 kg)    Physical Exam Vitals signs and nursing note reviewed.  Constitutional:      Appearance: He is well-developed.  HENT:     Head: Normocephalic and atraumatic.  Neck:     Musculoskeletal: Normal range of motion.  Cardiovascular:     Rate and Rhythm: Normal rate and regular rhythm.     Heart sounds: Normal heart sounds. No murmur. No friction rub. No gallop.   Pulmonary:     Effort: Pulmonary effort is normal. No tachypnea or respiratory distress.     Breath sounds: Normal breath sounds. No decreased breath sounds, wheezing, rhonchi or rales.  Chest:     Chest wall: No tenderness.  Abdominal:  General: Bowel sounds are normal.     Palpations: Abdomen is soft.  Musculoskeletal: Normal range of motion.  Skin:    General: Skin is warm and dry.  Neurological:     Mental Status: He is alert and oriented to person, place, and time.     Coordination: Coordination normal.  Psychiatric:        Behavior: Behavior normal. Behavior is cooperative.        Thought Content: Thought content normal.        Judgment: Judgment normal.          Patient has been counseled extensively about nutrition and exercise as well as the importance of adherence with medications and regular follow-up. The patient was given clear instructions to go to ER or return to medical center if symptoms don't improve, worsen or new problems develop. The patient verbalized understanding.   Follow-up: Return if symptoms worsen or fail to improve.   Gildardo Pounds, FNP-BC [facility] [place], [place]   04/10/2019, 9:48 AM

## 2019-04-11 LAB — LIPID PANEL
Chol/HDL Ratio: 3.1 ratio (ref 0.0–5.0)
Cholesterol, Total: 151 mg/dL (ref 100–199)
HDL: 48 mg/dL (ref >39)
LDL Chol Calc (NIH): 90 mg/dL (ref 0–99)
Triglycerides: 64 mg/dL (ref 0–149)
VLDL Cholesterol Cal: 13 mg/dL (ref 5–40)

## 2019-04-11 LAB — CMP14+EGFR
ALT: 31 IU/L (ref 0–44)
AST: 23 IU/L (ref 0–40)
Albumin/Globulin Ratio: 1.8 (ref 1.2–2.2)
Albumin: 4.4 g/dL (ref 4.0–5.0)
Alkaline Phosphatase: 98 IU/L (ref 39–117)
BUN/Creatinine Ratio: 10 (ref 9–20)
BUN: 6 mg/dL (ref 6–24)
Bilirubin Total: 0.4 mg/dL (ref 0.0–1.2)
CO2: 25 mmol/L (ref 20–29)
Calcium: 9.2 mg/dL (ref 8.7–10.2)
Chloride: 103 mmol/L (ref 96–106)
Creatinine, Ser: 0.63 mg/dL — ABNORMAL LOW (ref 0.76–1.27)
GFR calc Af Amer: 136 mL/min/{1.73_m2} (ref >59)
GFR calc non Af Amer: 117 mL/min/{1.73_m2} (ref >59)
Globulin, Total: 2.5 g/dL (ref 1.5–4.5)
Glucose: 120 mg/dL — ABNORMAL HIGH (ref 65–99)
Potassium: 4.4 mmol/L (ref 3.5–5.2)
Sodium: 138 mmol/L (ref 134–144)
Total Protein: 6.9 g/dL (ref 6.0–8.5)

## 2019-04-11 LAB — CBC
Hematocrit: 45.1 % (ref 37.5–51.0)
Hemoglobin: 14.6 g/dL (ref 13.0–17.7)
MCH: 30.9 pg (ref 26.6–33.0)
MCHC: 32.4 g/dL (ref 31.5–35.7)
MCV: 96 fL (ref 79–97)
Platelets: 327 10*3/uL (ref 150–450)
RBC: 4.72 x10E6/uL (ref 4.14–5.80)
RDW: 13.1 % (ref 11.6–15.4)
WBC: 8.1 10*3/uL (ref 3.4–10.8)

## 2019-04-16 NOTE — Progress Notes (Signed)
Normal lab letter mailed.

## 2019-04-24 MED FILL — ?OMEPRAZOLE 20MG CAP DR: 20 | 30 days supply | Qty: 60 | Fill #2

## 2019-04-24 MED FILL — ?METOPROLOL 25 MG TABLET: 25 | 30 days supply | Qty: 30 | Fill #1

## 2019-04-24 MED FILL — ?ATORVASTATIN 40MG TABLET: 40 | 30 days supply | Qty: 60 | Fill #5

## 2019-04-24 MED FILL — $BRILINTA 90 MG TABLET: 90 | 30 days supply | Qty: 60 | Fill #3

## 2019-05-24 MED FILL — ?OMEPRAZOLE 20MG CAP DR: 20 | 30 days supply | Qty: 60 | Fill #3

## 2019-05-24 MED FILL — $BRILINTA 90 MG TABLET: 90 | 30 days supply | Qty: 60 | Fill #4

## 2019-05-24 MED FILL — ?ATORVASTATIN 40MG TABLET: 40 | 30 days supply | Qty: 60 | Fill #6

## 2019-05-24 MED FILL — ALBUTEROL SULFATE HFA 108 (: 108 (90 BAS | 25 days supply | Qty: 18 | Fill #1

## 2019-05-24 MED FILL — ?METOPROLOL 25 MG TABLET: 25 | 30 days supply | Qty: 30 | Fill #2

## 2019-07-01 ENCOUNTER — Other Ambulatory Visit: Payer: Self-pay | Admitting: Family Medicine

## 2019-07-01 DIAGNOSIS — K219 Gastro-esophageal reflux disease without esophagitis: Secondary | ICD-10-CM

## 2019-07-01 MED FILL — ALBUTEROL SULFATE HFA 108 (: 108 (90 BAS | 25 days supply | Qty: 18 | Fill #2

## 2019-07-01 MED FILL — ?METOPROLOL 25 MG TABLET: 25 | 30 days supply | Qty: 30 | Fill #0

## 2019-07-01 MED FILL — !BRILINTA 90 MG TABLET: 30 days supply | Qty: 60 | Fill #5

## 2019-07-01 MED FILL — ?ATORVASTATIN 40MG TABLET: 40 | 30 days supply | Qty: 60 | Fill #7

## 2019-07-02 MED FILL — ?OMEPRAZOLE 20MG CAP DR: 20 | 30 days supply | Qty: 60 | Fill #0

## 2019-07-30 MED FILL — !BRILINTA 90 MG TABLET: 30 days supply | Qty: 60 | Fill #1

## 2019-07-30 MED FILL — ?OMEPRAZOLE 20MG CAP DR: 20 | 30 days supply | Qty: 60 | Fill #0

## 2019-07-30 MED FILL — ?METOPROLOL 25 MG TABLET: 25 | 30 days supply | Qty: 30 | Fill #1

## 2019-07-30 MED FILL — !VENTOLIN HFA INHALER: 108 (90 BAS | 25 days supply | Qty: 18 | Fill #3

## 2019-07-30 MED FILL — ?ATORVASTATIN 40MG TABL: 40 | 30 days supply | Qty: 60 | Fill #8

## 2019-09-03 ENCOUNTER — Other Ambulatory Visit: Payer: Self-pay | Admitting: Family Medicine

## 2019-09-03 DIAGNOSIS — K219 Gastro-esophageal reflux disease without esophagitis: Secondary | ICD-10-CM

## 2019-09-03 DIAGNOSIS — I25118 Atherosclerotic heart disease of native coronary artery with other forms of angina pectoris: Secondary | ICD-10-CM

## 2019-09-03 DIAGNOSIS — I10 Essential (primary) hypertension: Secondary | ICD-10-CM

## 2019-09-03 MED ORDER — ATORVASTATIN CALCIUM 80 MG PO TABS: 80.0000 mg | ORAL_TABLET | Freq: Every day | ORAL | 2 refills | Status: DC

## 2019-09-03 MED ORDER — OMEPRAZOLE 20 MG PO CPDR: 20.0000 mg | DELAYED_RELEASE_CAPSULE | Freq: Two times a day (BID) | ORAL | 2 refills | Status: DC

## 2019-09-03 MED FILL — METOPROLOL TARTRATE 25 MG T: 25 | 30 days supply | Qty: 30 | Fill #0

## 2019-09-03 MED FILL — ATORVASTATIN CALCIUM 40 MG: 40 | 30 days supply | Qty: 60 | Fill #9

## 2019-09-03 MED FILL — !VENTOLIN HFA INHALER: 108 (90 BAS | 25 days supply | Qty: 18 | Fill #4

## 2019-09-03 MED FILL — OMEPRAZOLE 20 MG CAP: 20 | 30 days supply | Qty: 60 | Fill #1

## 2019-09-03 NOTE — Telephone Encounter (Signed)
1) Medication(s) Requested (by name): ticagrelor (BRILINTA) 90 MG TABS tablet [[id number]]  metoprolol tartrate (LOPRESSOR) 25 MG tablet [[id number]] omeprazole (PRILOSEC) 20 MG capsule [[id number]]  atorvastatin (LIPITOR) 80 MG tablet [[id number]]   2) Pharmacy of Choice:  [facility] - [place], [place] - [address]  [address] [place], [place] [address]    Approved medications will be sent to pharmacy, we will reach out to you if there is an issue.  Requests made after 3pm may not be addressed until following business day!

## 2019-09-03 NOTE — Telephone Encounter (Signed)
Metoprolol, Omeprazole & Atorvastatin sent to pharmacy.  Patient should contact his Cardiologist for refills on his Brilinta.

## 2019-09-03 NOTE — Telephone Encounter (Signed)
Patient of PCE- please review for refill.

## 2019-09-04 ENCOUNTER — Telehealth: Payer: Self-pay | Admitting: Cardiology

## 2019-09-04 NOTE — Telephone Encounter (Signed)
Attempted to call patient's mother back, no answer. Voicemail is full, unable to leave message. Called patient and left voicemail to inform patient samples will be left at the front desk and to make a follow up appointment to discuss further options.

## 2019-09-04 NOTE — Telephone Encounter (Signed)
Left message for patient, he is approved for patient assistance with [employer]. He will receive a shipment in the next several days. He needs to make a follow up appointment to see dr Jens Som.

## 2019-09-04 NOTE — Telephone Encounter (Signed)
Follow up  Pt's mother called back to follow up sample request for Brilinta. She said they really need this today since pt ran out last night

## 2019-09-04 NOTE — Telephone Encounter (Signed)
New message   Patient calling the office for samples of medication:   1.  What medication and dosage are you requesting samples for?  ticagrelor (BRILINTA) 90 MG TABS tablet     2.  Are you currently out of this medication? Yes  Patient's mother wants to know what options that the patient has because the patient can't afford this medication. Please advise.

## 2019-09-09 ENCOUNTER — Ambulatory Visit: Payer: Self-pay | Admitting: Cardiology

## 2019-09-25 ENCOUNTER — Ambulatory Visit: Payer: Self-pay | Admitting: General Practice

## 2019-10-10 MED FILL — ?OMEPRAZOLE 20 MG CAP DR: 20 | 30 days supply | Qty: 60 | Fill #2

## 2019-10-10 MED FILL — ?METOPROLOL TART 25MG TABLE: 25 | 30 days supply | Qty: 30 | Fill #1

## 2019-10-10 MED FILL — ATORVASTATIN 80 MG TABLET: 80 | 30 days supply | Qty: 30 | Fill #0

## 2019-10-14 ENCOUNTER — Telehealth: Payer: Self-pay | Admitting: Internal Medicine

## 2019-10-31 ENCOUNTER — Encounter (HOSPITAL_COMMUNITY): Payer: Self-pay | Admitting: Emergency Medicine

## 2019-10-31 ENCOUNTER — Emergency Department (HOSPITAL_COMMUNITY)
Admission: EM | Admit: 2019-10-31 | Discharge: 2019-11-01 | Disposition: A | Discharge status: Home / Self Care | Payer: Self-pay | Attending: Emergency Medicine | Admitting: Emergency Medicine

## 2019-10-31 ENCOUNTER — Emergency Department (HOSPITAL_COMMUNITY): Payer: Self-pay

## 2019-10-31 DIAGNOSIS — F10129 Alcohol abuse with intoxication, unspecified: Secondary | ICD-10-CM | POA: Insufficient documentation

## 2019-10-31 DIAGNOSIS — I2510 Atherosclerotic heart disease of native coronary artery without angina pectoris: Secondary | ICD-10-CM | POA: Insufficient documentation

## 2019-10-31 DIAGNOSIS — Y999 Unspecified external cause status: Secondary | ICD-10-CM | POA: Insufficient documentation

## 2019-10-31 DIAGNOSIS — Z955 Presence of coronary angioplasty implant and graft: Secondary | ICD-10-CM | POA: Insufficient documentation

## 2019-10-31 DIAGNOSIS — F05 Delirium due to known physiological condition: Secondary | ICD-10-CM | POA: Insufficient documentation

## 2019-10-31 DIAGNOSIS — Y939 Activity, unspecified: Secondary | ICD-10-CM | POA: Insufficient documentation

## 2019-10-31 DIAGNOSIS — Y929 Unspecified place or not applicable: Secondary | ICD-10-CM | POA: Insufficient documentation

## 2019-10-31 DIAGNOSIS — Z79899 Other long term (current) drug therapy: Secondary | ICD-10-CM | POA: Insufficient documentation

## 2019-10-31 DIAGNOSIS — F17210 Nicotine dependence, cigarettes, uncomplicated: Secondary | ICD-10-CM | POA: Insufficient documentation

## 2019-10-31 DIAGNOSIS — X58XXXA Exposure to other specified factors, initial encounter: Secondary | ICD-10-CM | POA: Insufficient documentation

## 2019-10-31 DIAGNOSIS — S0990XA Unspecified injury of head, initial encounter: Secondary | ICD-10-CM | POA: Insufficient documentation

## 2019-10-31 LAB — CBC WITH DIFFERENTIAL/PLATELET
Abs Immature Granulocytes: 0.25 10*3/uL — ABNORMAL HIGH (ref 0.00–0.07)
Basophils Absolute: 0.2 10*3/uL — ABNORMAL HIGH (ref 0.0–0.1)
Basophils Relative: 1 %
Eosinophils Absolute: 0.3 10*3/uL (ref 0.0–0.5)
Eosinophils Relative: 3 %
HCT: 49.8 % (ref 39.0–52.0)
Hemoglobin: 15.8 g/dL (ref 13.0–17.0)
Immature Granulocytes: 2 %
Lymphocytes Relative: 20 %
Lymphs Abs: 2.6 10*3/uL (ref 0.7–4.0)
MCH: 31.5 pg (ref 26.0–34.0)
MCHC: 31.7 g/dL (ref 30.0–36.0)
MCV: 99.4 fL (ref 80.0–100.0)
Monocytes Absolute: 1.3 10*3/uL — ABNORMAL HIGH (ref 0.1–1.0)
Monocytes Relative: 10 %
Neutro Abs: 8.6 10*3/uL — ABNORMAL HIGH (ref 1.7–7.7)
Neutrophils Relative %: 64 %
Platelets: 399 10*3/uL (ref 150–400)
RBC: 5.01 MIL/uL (ref 4.22–5.81)
RDW: 14.5 % (ref 11.5–15.5)
WBC: 13.3 10*3/uL — ABNORMAL HIGH (ref 4.0–10.5)
nRBC: 0 % (ref 0.0–0.2)

## 2019-10-31 LAB — COMPREHENSIVE METABOLIC PANEL
ALT: 29 U/L (ref 0–44)
AST: 33 U/L (ref 15–41)
Albumin: 3.9 g/dL (ref 3.5–5.0)
Alkaline Phosphatase: 87 U/L (ref 38–126)
Anion gap: 14 (ref 5–15)
BUN: 6 mg/dL (ref 6–20)
CO2: 22 mmol/L (ref 22–32)
Calcium: 8.9 mg/dL (ref 8.9–10.3)
Chloride: 105 mmol/L (ref 98–111)
Creatinine, Ser: 0.71 mg/dL (ref 0.61–1.24)
GFR calc Af Amer: >60 mL/min (ref >60)
GFR calc non Af Amer: >60 mL/min (ref >60)
Glucose, Bld: 114 mg/dL — ABNORMAL HIGH (ref 70–99)
Potassium: 4.5 mmol/L (ref 3.5–5.1)
Sodium: 141 mmol/L (ref 135–145)
Total Bilirubin: 0.7 mg/dL (ref 0.3–1.2)
Total Protein: 6.9 g/dL (ref 6.5–8.1)

## 2019-10-31 LAB — ETHANOL: Alcohol, Ethyl (B): 439 mg/dL (ref <10)

## 2019-10-31 LAB — LIPASE, BLOOD: Lipase: 70 U/L — ABNORMAL HIGH (ref 11–51)

## 2019-10-31 MED ORDER — HALOPERIDOL LACTATE 5 MG/ML IJ SOLN
5.0000 mg | Freq: Once | INTRAMUSCULAR | Status: AC
  Administered 2019-10-31: 5 mg via INTRAVENOUS
  Filled 2019-10-31: qty 1
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
## 2019-10-31 NOTE — ED Notes (Signed)
Mother at bedside.
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
## 2019-10-31 NOTE — ED Triage Notes (Signed)
Heavy ETOH on board per EMS
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
## 2019-11-01 NOTE — ED Provider Notes (Signed)
12:06 AM Assumed care from Dr. Jeraldine Loots, please see their note for full history, physical and decision making until this point. In brief this is a 49 y.o. year old male who presented to the ED tonight with Head Injury     MTF. Pending imaging.   Workup unremarkable. Recheck and patient significantly improved, ready to go home. States his mother can come pick him up.   Went to reeval patient for discharge and ensure there was a more sober person to be with him through the night (esp with combo of alcohol, ER sedation and head injury) however patient was no longer present.   Labs, studies and imaging reviewed by myself and considered in medical decision making if ordered. Imaging interpreted by radiology.  Labs Reviewed  COMPREHENSIVE METABOLIC PANEL - Abnormal; Notable for the following components:      Result Value   Glucose, Bld 114 (*)    All other components within normal limits  ETHANOL - Abnormal; Notable for the following components:   Alcohol, Ethyl (B) 439 (*)    All other components within normal limits  LIPASE, BLOOD - Abnormal; Notable for the following components:   Lipase 70 (*)    All other components within normal limits  CBC WITH DIFFERENTIAL/PLATELET - Abnormal; Notable for the following components:   WBC 13.3 (*)    Neutro Abs 8.6 (*)    Monocytes Absolute 1.3 (*)    Basophils Absolute 0.2 (*)    Abs Immature Granulocytes 0.25 (*)    All other components within normal limits    CT Head Wo Contrast    (Results Pending)  CT Maxillofacial WO CM    (Results Pending)  CT Cervical Spine Wo Contrast    (Results Pending)    No follow-ups on file.    Dustin Olsen, Dustin Cower, MD 11/03/19 [phone]
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
## 2019-11-01 NOTE — ED Notes (Signed)
Patient removed from restraints.

## 2019-11-01 NOTE — ED Notes (Signed)
Ambulated pt in room. Pt ambulated with no assistance and with steady gait with no dizziness.

## 2019-11-01 NOTE — ED Notes (Signed)
Security called patients mother to come and pick him up.

## 2019-11-01 NOTE — ED Notes (Signed)
Patient up walking around the room , states he was leaving  I attempted to get patient to stay and he stated he was leaving he was ready to go, refused to stay , I and tech tried to at least get him to let us remove his IV stated I will do it myself and walked out of the room. EDP aware.

## 2019-11-13 MED FILL — ATORVASTATIN 80 MG TABLET: 80 | 30 days supply | Qty: 30 | Fill #1

## 2019-11-13 MED FILL — ?METOPROLOL TART 25MG TABLE: 25 | 30 days supply | Qty: 30 | Fill #2

## 2019-11-13 MED FILL — ?OMEPRAZOLE 20 MG CAP DR: 20 | 30 days supply | Qty: 60 | Fill #0

## 2019-12-20 MED FILL — ?OMEPRAZOLE 20 MG CPDR: 20 | 30 days supply | Qty: 60 | Fill #1

## 2019-12-20 MED FILL — ATORVASTATIN 80 MG TABLET: 80 | 30 days supply | Qty: 30 | Fill #2

## 2020-01-14 ENCOUNTER — Other Ambulatory Visit: Payer: Self-pay | Admitting: Cardiology

## 2020-01-14 ENCOUNTER — Telehealth: Payer: Self-pay | Admitting: Family Medicine

## 2020-01-14 DIAGNOSIS — I10 Essential (primary) hypertension: Secondary | ICD-10-CM

## 2020-01-14 MED ORDER — METOPROLOL TARTRATE 25 MG PO TABS: ORAL_TABLET | ORAL | 0 refills | Status: DC

## 2020-01-14 MED FILL — ?METOPROLOL TART 25MG TABLE: 25 | 30 days supply | Qty: 30 | Fill #0

## 2020-01-14 NOTE — Telephone Encounter (Signed)
*  STAT* If patient is at the pharmacy, call can be transferred to refill team.   1. Which medications need to be refilled? (please list name of each medication and dose if known) metoprolol tartrate (LOPRESSOR) 25 MG tablet  2. Which pharmacy/location (including street and city if local pharmacy) is medication to be sent to? [facility] - [place], [address] - [address]  3. Do they need a 30 day or 90 day supply? 90 day supply  Patient's mother states the patient has been completely out of medication since 01/10/20.

## 2020-02-03 ENCOUNTER — Other Ambulatory Visit: Payer: Self-pay | Admitting: Cardiology

## 2020-02-03 MED FILL — ?OMEPRAZOLE 20 MG CPDR: 20 | 30 days supply | Qty: 60 | Fill #2

## 2020-02-04 ENCOUNTER — Other Ambulatory Visit: Payer: Self-pay

## 2020-02-04 ENCOUNTER — Other Ambulatory Visit: Payer: Self-pay | Admitting: Cardiology

## 2020-02-04 MED ORDER — ATORVASTATIN CALCIUM 80 MG PO TABS: 80.0000 mg | ORAL_TABLET | Freq: Every day | ORAL | 0 refills | Status: DC

## 2020-02-04 MED FILL — ATORVASTATIN 80 MG TABLET: 80 | 30 days supply | Qty: 30 | Fill #0

## 2020-02-04 NOTE — Telephone Encounter (Signed)
Rx(s) sent to pharmacy electronically.  

## 2020-02-04 NOTE — Telephone Encounter (Signed)
   *  STAT* If patient is at the pharmacy, call can be transferred to refill team.   1. Which medications need to be refilled? (please list name of each medication and dose if known) atorvastatin (LIPITOR) 80 MG tablet  2. Which pharmacy/location (including street and city if local pharmacy) is medication to be sent to? [facility] - [place], [place] - [place] [address]  3. Do they need a 30 day or 90 day supply? 30 days   Pt made an appt to see Edd Fabian on 08/25

## 2020-02-11 NOTE — Progress Notes (Signed)
error 

## 2020-02-12 ENCOUNTER — Telehealth (INDEPENDENT_AMBULATORY_CARE_PROVIDER_SITE_OTHER): Payer: Self-pay | Admitting: General Practice

## 2020-02-20 MED FILL — ?METOPROLOL TART 25MG TABLE: 25 | 30 days supply | Qty: 30 | Fill #1

## 2020-02-26 ENCOUNTER — Other Ambulatory Visit: Payer: Self-pay

## 2020-02-26 ENCOUNTER — Ambulatory Visit (HOSPITAL_COMMUNITY)
Admission: EM | Admit: 2020-02-26 | Discharge: 2020-02-26 | Disposition: A | Discharge status: Home / Self Care | Payer: Self-pay | Attending: Urgent Care | Admitting: Urgent Care

## 2020-02-26 ENCOUNTER — Encounter (HOSPITAL_COMMUNITY): Payer: Self-pay

## 2020-02-26 DIAGNOSIS — R519 Headache, unspecified: Secondary | ICD-10-CM

## 2020-02-26 DIAGNOSIS — K047 Periapical abscess without sinus: Secondary | ICD-10-CM

## 2020-02-26 DIAGNOSIS — K0889 Other specified disorders of teeth and supporting structures: Secondary | ICD-10-CM

## 2020-02-26 DIAGNOSIS — R220 Localized swelling, mass and lump, head: Secondary | ICD-10-CM

## 2020-02-26 MED ORDER — NAPROXEN 375 MG PO TABS: 375.0000 mg | ORAL_TABLET | Freq: Two times a day (BID) | ORAL | 0 refills | Status: AC

## 2020-02-26 MED ORDER — HYDROCODONE-ACETAMINOPHEN 5-325 MG PO TABS: 1.0000 | ORAL_TABLET | Freq: Four times a day (QID) | ORAL | 0 refills | Status: DC | PRN

## 2020-02-26 MED ORDER — CLINDAMYCIN HCL 300 MG PO CAPS: 300.0000 mg | ORAL_CAPSULE | Freq: Three times a day (TID) | ORAL | 0 refills | Status: DC

## 2020-02-26 NOTE — Discharge Instructions (Addendum)
Make sure you schedule an appointment with a dentist/dental surgeon as soon as possible.  You may try some of the resources below.  Please schedule naproxen twice daily with food for your severe pain.  If you still have pain despite taking naproxen regularly, this is breakthrough pain.  You can use hydrocodone, a narcotic pain medicine, once every 4-6 hours for this.  Once your pain is better controlled, switch back to just naproxen.    [facility] [phone] extension [phone]  Dr. Lawrence Marseilles [phone] [address]  [place] [phone] [address] [facility] [place].  [facility] [phone] extension [phone] [place], [place], [address] First come first serve for the first 10 clients.  May do simple extractions only, no wisdom teeth or surgery.  You may try the second for Thursday of the month starting at 6:30 AM.  [facility] You may call the school to see if they are still helping to provide dental care for emergent cases.

## 2020-02-26 NOTE — ED Provider Notes (Signed)
[facility]   MRN: [id number] DOB: 1971/02/25  Subjective:   Kentrail Shew. is a 49 y.o. male presenting for 3-day history of acute onset left-sided jaw pain that is radiating anteriorly.  He has now developed facial swelling and pain, bad taste in his mouth.  He notes that he has had a cracked tooth and has had a difficult time chewing on that side.  Has used Tylenol with minimal relief.  Does not have a dentist.  No current facility-administered medications for this encounter.  Current Outpatient Medications:  .  albuterol (VENTOLIN HFA) 108 (90 Base) MCG/ACT inhaler, Inhale 2 puffs into the lungs every 6 (six) hours as needed for wheezing or shortness of breath., Disp: 18 g, Rfl: 5 .  aspirin 81 MG chewable tablet, Chew 1 tablet (81 mg total) by mouth daily., Disp: , Rfl:  .  atorvastatin (LIPITOR) 80 MG tablet, Take 1 tablet (80 mg total) by mouth daily at 6 PM., Disp: 30 tablet, Rfl: 0 .  metoprolol tartrate (LOPRESSOR) 25 MG tablet, TAKE 1/2 TABLET (12.5 MG TOTAL) BY MOUTH 2 (TWO) TIMES DAILY. Schedule ov for further refills., Disp: 90 tablet, Rfl: 0 .  omeprazole (PRILOSEC) 20 MG capsule, Take 1 capsule (20 mg total) by mouth 2 (two) times daily., Disp: 60 capsule, Rfl: 2 .  ticagrelor (BRILINTA) 90 MG TABS tablet, Take 1 tablet (90 mg total) by mouth 2 (two) times daily., Disp: 60 tablet, Rfl: 5 .  nitroGLYCERIN (NITROSTAT) 0.4 MG SL tablet, Place 1 tablet (0.4 mg total) under the tongue every 5 (five) minutes x 3 doses as needed for chest pain., Disp: 25 tablet, Rfl: 12   Allergies  Allergen Reactions  . Penicillins Rash    Has patient had a PCN reaction causing immediate rash, facial/tongue/throat swelling, SOB or lightheadedness with hypotension: YES Has patient had a PCN reaction causing severe rash involving mucus membranes or skin necrosis: NO Has patient had a PCN reaction that required hospitalization: NO Has patient had a PCN reaction occurring within  the last 10 years: NO If all of the above answers are "NO", then may proceed with Cephalosporin use.  . Tramadol Hcl Hives    Past Medical History:  Diagnosis Date  . Anxiety   . Bipolar disorder (HCC)   . CAD (coronary artery disease)    a. 03/11/18: acute STEMI s/p DES to LCx   . Depression   . Dyspnea   . GERD (gastroesophageal reflux disease)      Past Surgical History:  Procedure Laterality Date  . CORONARY/GRAFT ACUTE MI REVASCULARIZATION N/A 03/10/2018   Procedure: Coronary/Graft Acute MI Revascularization;  Surgeon: Lennette Bihari, MD;  Location: [facility] INVASIVE CV LAB;  Service: Cardiovascular;  Laterality: N/A;  . FOOT SURGERY    . LEFT HEART CATH AND CORONARY ANGIOGRAPHY N/A 03/10/2018   Procedure: LEFT HEART CATH AND CORONARY ANGIOGRAPHY;  Surgeon: Lennette Bihari, MD;  Location: [facility] INVASIVE CV LAB;  Service: Cardiovascular;  Laterality: N/A;  . NO PAST SURGERIES      Family History  Problem Relation Age of Onset  . Diabetes Mother   . Hyperlipidemia Father   . Hypertension Father   . Coronary artery disease Father        Stents placed  . Heart disease Maternal Grandmother   . Heart disease Paternal Grandmother     Social History   Tobacco Use  . Smoking status: Current Every Day Smoker    Packs/day:  1.00    Types: Cigarettes  . Smokeless tobacco: Never Used  Vaping Use  . Vaping Use: Never used  Substance Use Topics  . Alcohol use: Yes    Alcohol/week: 6.0 standard drinks    Types: 6 Cans of beer per week    Comment: 6 pack per day  . Drug use: Yes    Types: Marijuana    ROS   Objective:   Vitals: BP 136/89 (BP Location: Left Arm)   Pulse 88   Temp 98.2 F (36.8 C) (Oral)   Resp 17   SpO2 100%   Physical Exam Constitutional:      General: He is not in acute distress.    Appearance: Normal appearance. He is well-developed and normal weight. He is not ill-appearing, toxic-appearing or diaphoretic.  HENT:     Head: Normocephalic and  atraumatic.      Right Ear: External ear normal.     Left Ear: External ear normal.     Nose: Nose normal.     Mouth/Throat:     Dentition: Abnormal dentition. Dental tenderness, gingival swelling and dental caries present.     Pharynx: Oropharynx is clear.   Eyes:     General: No scleral icterus.       Right eye: No discharge.        Left eye: No discharge.     Extraocular Movements: Extraocular movements intact.     Pupils: Pupils are equal, round, and reactive to light.  Cardiovascular:     Rate and Rhythm: Normal rate.  Pulmonary:     Effort: Pulmonary effort is normal.  Musculoskeletal:     Cervical back: Normal range of motion.  Neurological:     Mental Status: He is alert and oriented to person, place, and time.  Psychiatric:        Mood and Affect: Mood normal.        Behavior: Behavior normal.        Thought Content: Thought content normal.        Judgment: Judgment normal.       Assessment and Plan :   I have reviewed the PDMP during this encounter.  1. Facial swelling   2. Facial pain   3. Dental infection   4. Pain, dental     Start clindamycin for dental infection/abscess, use naproxen for pain and inflammation, hydrocodone for breakthrough pain. Emphasized need for dental surgeon consult. Counseled patient on potential for adverse effects with medications prescribed/recommended today, strict ER and return-to-clinic precautions discussed, patient verbalized understanding.    Wallis Bamberg, PA-C 02/26/20 1038

## 2020-02-26 NOTE — ED Triage Notes (Signed)
Pt c/o left jaw swelling for approx 3 days. Pt states swelling started to back of jaw/mouth, then moved forward over the past two days. States he has tooth broken off on left lower molar area. Also c/o HA, bad taste in mouth. Airway patent, denies dysphagia, SOB, fever, chills, n/v/d.  Took tylenol yesterday.

## 2020-03-10 MED FILL — ATORVASTATIN CALCIUM 80 MG: 80 | 30 days supply | Qty: 30 | Fill #1

## 2020-03-30 MED FILL — ?METOPROLOL TART 25MG TABLE: 25 | 30 days supply | Qty: 30 | Fill #2

## 2020-04-08 NOTE — Telephone Encounter (Signed)
error 

## 2020-04-17 MED FILL — ATORVASTATIN CALCIUM 80 MG: 80 | 30 days supply | Qty: 30 | Fill #0

## 2020-05-18 ENCOUNTER — Other Ambulatory Visit: Payer: Self-pay | Admitting: Cardiology

## 2020-05-18 DIAGNOSIS — I10 Essential (primary) hypertension: Secondary | ICD-10-CM

## 2020-05-18 MED ORDER — ATORVASTATIN CALCIUM 80 MG PO TABS: 80.0000 mg | ORAL_TABLET | Freq: Every day | ORAL | 0 refills | Status: DC

## 2020-05-18 MED ORDER — METOPROLOL TARTRATE 25 MG PO TABS: ORAL_TABLET | ORAL | 0 refills | Status: DC

## 2020-05-18 MED FILL — ATORVASTATIN CALCIUM 80 MG: 80 | 30 days supply | Qty: 30 | Fill #0

## 2020-05-18 NOTE — Telephone Encounter (Signed)
°*  STAT* If patient is at the pharmacy, call can be transferred to refill team.   1. Which medications need to be refilled? (please list name of each medication and dose if known) atorvastatin (LIPITOR) 80 MG tablet /  metoprolol tartrate (LOPRESSOR) 25 MG tablet     2. Which pharmacy/location (including street and city if local pharmacy) is medication to be sent to? [facility] [place], [place] - [place] [address]  3. Do they need a 30 day or 90 day supply? 90

## 2020-05-20 ENCOUNTER — Other Ambulatory Visit: Payer: Self-pay | Admitting: Internal Medicine

## 2020-05-20 ENCOUNTER — Other Ambulatory Visit: Payer: Self-pay | Admitting: Cardiology

## 2020-05-20 DIAGNOSIS — I10 Essential (primary) hypertension: Secondary | ICD-10-CM

## 2020-05-20 DIAGNOSIS — K219 Gastro-esophageal reflux disease without esophagitis: Secondary | ICD-10-CM

## 2020-05-20 MED FILL — ?METOPROLOL TART 25MG TABLE: 25 | 30 days supply | Qty: 30 | Fill #0

## 2020-06-17 ENCOUNTER — Ambulatory Visit: Payer: Self-pay | Admitting: Cardiology

## 2020-06-30 ENCOUNTER — Encounter: Payer: Self-pay | Admitting: Cardiology

## 2020-06-30 ENCOUNTER — Encounter: Payer: Self-pay | Admitting: General Practice

## 2020-07-02 ENCOUNTER — Telehealth: Payer: Self-pay | Admitting: General Practice

## 2020-07-02 ENCOUNTER — Other Ambulatory Visit: Payer: Self-pay | Admitting: General Practice

## 2020-07-02 DIAGNOSIS — K219 Gastro-esophageal reflux disease without esophagitis: Secondary | ICD-10-CM

## 2020-07-02 DIAGNOSIS — I10 Essential (primary) hypertension: Secondary | ICD-10-CM

## 2020-07-02 MED ORDER — ATORVASTATIN CALCIUM 80 MG PO TABS: 80.0000 mg | ORAL_TABLET | Freq: Every day | ORAL | 11 refills | Status: DC

## 2020-07-02 MED ORDER — OMEPRAZOLE 20 MG PO CPDR
20.0000 mg | DELAYED_RELEASE_CAPSULE | Freq: Two times a day (BID) | ORAL | 11 refills | Status: DC
  Filled 2020-11-26: qty 60, 30d supply, fill #0
  Filled 2021-01-11: qty 60, 30d supply, fill #1
  Filled 2021-02-10: qty 60, 30d supply, fill #2
  Filled 2021-03-18: qty 60, 30d supply, fill #3
  Filled 2021-04-26: qty 60, 30d supply, fill #4
  Filled 2021-06-01: qty 60, 30d supply, fill #5

## 2020-07-02 MED ORDER — METOPROLOL TARTRATE 25 MG PO TABS
ORAL_TABLET | ORAL | 11 refills | Status: DC
  Filled 2020-10-26: qty 30, 30d supply, fill #0
  Filled 2020-11-26: qty 30, 30d supply, fill #1
  Filled 2021-01-11: qty 30, 30d supply, fill #2
  Filled 2021-02-10: qty 30, 30d supply, fill #3
  Filled 2021-03-18: qty 30, 30d supply, fill #4
  Filled 2021-04-26: qty 30, 30d supply, fill #5
  Filled 2021-06-01: qty 30, 30d supply, fill #6

## 2020-07-02 MED FILL — ?METOPROLOL TARTRATE 25MG T: 25 | 30 days supply | Qty: 30 | Fill #0

## 2020-07-02 MED FILL — ATORVASTATIN CALCIUM 80 MG: 80 | 30 days supply | Qty: 30 | Fill #0

## 2020-07-02 MED FILL — ?OMEPRAZOLE 20 MG CPDR: 20 | 30 days supply | Qty: 60 | Fill #0

## 2020-07-02 NOTE — Telephone Encounter (Signed)
Per mother, patient is out of all medication. Please send in prescriptions.

## 2020-07-02 NOTE — Telephone Encounter (Signed)
*  STAT* If patient is at the pharmacy, call can be transferred to refill team.   1. Which medications need to be refilled? (please list name of each medication and dose if known)  atorvastatin (LIPITOR) 80 MG tablet; metoprolol tartrate (LOPRESSOR) 25 MG tablet; omeprazole (PRILOSEC) 20 MG capsule  2. Which pharmacy/location (including street and city if local pharmacy) is medication to be sent to? [facility] - [place], [place] - [place] [address]  3. Do they need a 30 day or 90 day supply? 30

## 2020-07-02 NOTE — Telephone Encounter (Signed)
Prescription sent in to [facility].

## 2020-07-28 ENCOUNTER — Ambulatory Visit: Payer: Self-pay | Admitting: Cardiology

## 2020-08-03 ENCOUNTER — Encounter: Payer: Self-pay | Admitting: Cardiology

## 2020-08-03 ENCOUNTER — Telehealth: Payer: Self-pay | Admitting: Cardiology

## 2020-08-03 ENCOUNTER — Ambulatory Visit (INDEPENDENT_AMBULATORY_CARE_PROVIDER_SITE_OTHER): Payer: Self-pay | Admitting: Cardiology

## 2020-08-03 ENCOUNTER — Other Ambulatory Visit: Payer: Self-pay

## 2020-08-03 VITALS — BP 120/78 | HR 88 | Ht 70.0 in | Wt 226.6 lb

## 2020-08-03 DIAGNOSIS — F319 Bipolar disorder, unspecified: Secondary | ICD-10-CM

## 2020-08-03 DIAGNOSIS — Z79899 Other long term (current) drug therapy: Secondary | ICD-10-CM

## 2020-08-03 DIAGNOSIS — R609 Edema, unspecified: Secondary | ICD-10-CM

## 2020-08-03 DIAGNOSIS — I10 Essential (primary) hypertension: Secondary | ICD-10-CM

## 2020-08-03 DIAGNOSIS — I25118 Atherosclerotic heart disease of native coronary artery with other forms of angina pectoris: Secondary | ICD-10-CM

## 2020-08-03 DIAGNOSIS — R0609 Other forms of dyspnea: Secondary | ICD-10-CM

## 2020-08-03 DIAGNOSIS — I252 Old myocardial infarction: Secondary | ICD-10-CM

## 2020-08-03 DIAGNOSIS — E785 Hyperlipidemia, unspecified: Secondary | ICD-10-CM | POA: Insufficient documentation

## 2020-08-03 DIAGNOSIS — R0600 Dyspnea, unspecified: Secondary | ICD-10-CM

## 2020-08-03 MED FILL — ?METOPROLOL TARTRATE 25MG T: 25 | 30 days supply | Qty: 30 | Fill #1

## 2020-08-03 MED FILL — ATORVASTATIN CALCIUM 80 MG: 80 | 30 days supply | Qty: 30 | Fill #1

## 2020-08-03 MED FILL — ?OMEPRAZOLE 20 MG CPDR: 20 | 30 days supply | Qty: 60 | Fill #1

## 2020-08-03 NOTE — Progress Notes (Signed)
Cardiology Office Note:    Date:  08/03/2020   ID:  Dustin Draft., DOB May 11, 1971, MRN [id number]  PCP:  Cain Saupe, MD (Inactive)  Cardiologist:  Olga Millers, MD  Electrophysiologist:  None   Referring MD: Cain Saupe, MD   No chief complaint on file.   History of Present Illness:    Dustin Olsen. is a 50 y.o. male with a hx of CAD status post circumflex ST EMI in September 2019. At catheterization he had circumflex disease which was treated with PCI and DES. His LV function was preserved. He had no other critical coronary disease. He was seen in follow-up in July 2020, he has not been seen since. He has a history of hypertension, he had moderate LVH and left atrial enlargement by echocardiogram September 2019. He has a history of bipolar disorder. He has dyslipidemia. He presents to the office today for a follow-up.  Since we saw him last he tells me he really does not have a primary care provider.  He gets his medications from [facility].  He lives in a house he tells me with his mother and brother.  He works occasionally doing Engineering geologist.  He denies having any chest pain.  He does say that a couple weeks ago he was waking up short of breath.  He said he could not go to sleep, "I was up for for 5 days".  He also had lower extremity edema at that time.  For some reason over the last week or 2 all his symptoms have resolved.  In the office today his blood pressure is controlled and he has no evidence of heart failure on exam.  He does admit that he does not really follow a low-salt or low-cholesterol diet.  He is run out of several of his medications.  He has been cutting his Lipitor in half but has run out of those as well.  Past Medical History:  Diagnosis Date  . Anxiety   . Bipolar disorder (HCC)   . CAD (coronary artery disease)    a. 03/11/18: acute STEMI s/p DES to LCx   . Depression   . Dyspnea   . GERD (gastroesophageal reflux disease)     Past  Surgical History:  Procedure Laterality Date  . CORONARY/GRAFT ACUTE MI REVASCULARIZATION N/A 03/10/2018   Procedure: Coronary/Graft Acute MI Revascularization;  Surgeon: Lennette Bihari, MD;  Location: [facility] INVASIVE CV LAB;  Service: Cardiovascular;  Laterality: N/A;  . FOOT SURGERY    . LEFT HEART CATH AND CORONARY ANGIOGRAPHY N/A 03/10/2018   Procedure: LEFT HEART CATH AND CORONARY ANGIOGRAPHY;  Surgeon: Lennette Bihari, MD;  Location: [facility] INVASIVE CV LAB;  Service: Cardiovascular;  Laterality: N/A;  . NO PAST SURGERIES      Current Medications: Current Meds  Medication Sig  . aspirin 81 MG chewable tablet Chew 1 tablet (81 mg total) by mouth daily.  Marland Kitchen atorvastatin (LIPITOR) 80 MG tablet Take 1 tablet (80 mg total) by mouth daily at 6 PM. Patient needs OV for future refills  . metoprolol tartrate (LOPRESSOR) 25 MG tablet TAKE 1/2 TABLET (12.5 MG TOTAL) BY MOUTH 2 (TWO) TIMES DAILY. SCHEDULE OV FOR FURTHER REFILLS.  . naproxen (NAPROSYN) 375 MG tablet Take 1 tablet (375 mg total) by mouth 2 (two) times daily with a meal.  . omeprazole (PRILOSEC) 20 MG capsule Take 1 capsule (20 mg total) by mouth 2 (two) times daily.  . [DISCONTINUED] ticagrelor (BRILINTA) 90  MG TABS tablet Take 1 tablet (90 mg total) by mouth 2 (two) times daily.     Allergies:   Penicillins and Tramadol hcl   Social History   Socioeconomic History  . Marital status: Single    Spouse name: Not on file  . Number of children: Not on file  . Years of education: Not on file  . Highest education level: Not on file  Occupational History  . Not on file  Tobacco Use  . Smoking status: Current Every Day Smoker    Packs/day: 1.00    Types: Cigarettes  . Smokeless tobacco: Never Used  Vaping Use  . Vaping Use: Never used  Substance and Sexual Activity  . Alcohol use: Yes    Alcohol/week: 6.0 standard drinks    Types: 6 Cans of beer per week    Comment: 6 pack per day  . Drug use: Yes    Types: Marijuana  . Sexual  activity: Not on file  Other Topics Concern  . Not on file  Social History Narrative   ** Merged History Encounter **       Social Determinants of Health   Financial Resource Strain: Not on file  Food Insecurity: Not on file  Transportation Needs: Not on file  Physical Activity: Not on file  Stress: Not on file  Social Connections: Not on file     Family History: The patient's family history includes Coronary artery disease in his father; Diabetes in his mother; Heart disease in his maternal grandmother and paternal grandmother; Hyperlipidemia in his father; Hypertension in his father.  ROS:   Please see the history of present illness.     All other systems reviewed and are negative.  EKGs/Labs/Other Studies Reviewed:    The following studies were reviewed today: Myoview 01/18/2019-  Normal perfusion No ischemia or scar  Nuclear stress EF: 50%.  This is a low risk study.    EKG:  EKG is ordered today.  The ekg ordered today demonstrates NSR, HR 88  Recent Labs: 10/31/2019: ALT 29; BUN 6; Creatinine, Ser 0.71; Hemoglobin 15.8; Platelets 399; Potassium 4.5; Sodium 141  Recent Lipid Panel    Component Value Date/Time   CHOL 151 04/10/2019 0947   TRIG 64 04/10/2019 0947   HDL 48 04/10/2019 0947   CHOLHDL 3.1 04/10/2019 0947   CHOLHDL 3.0 03/10/2018 0609   VLDL 17 03/10/2018 0609   LDLCALC 90 04/10/2019 0947    Physical Exam:    VS:  BP 120/78 (BP Location: Left Arm, Patient Position: Sitting)   Pulse 88   Ht 5\' 10"  (1.778 m)   Wt 226 lb 9.6 oz (102.8 kg)   SpO2 96%   BMI 32.51 kg/m     Wt Readings from Last 3 Encounters:  08/03/20 226 lb 9.6 oz (102.8 kg)  10/31/19 227 lb 15.3 oz (103.4 kg)  04/10/19 228 lb (103.4 kg)     GEN: Overweight male,  well developed in no acute distress HEENT: Normal NECK: No JVD; No carotid bruits CARDIAC: RRR, no murmurs, rubs, gallops RESPIRATORY:  Clear to auscultation without rales, wheezing or rhonchi  ABDOMEN: Soft,  non-tender, non-distended MUSCULOSKELETAL:  No edema; No deformity  SKIN: Warm and dry NEUROLOGIC:  Alert and oriented x 3 PSYCHIATRIC:  Normal affect   ASSESSMENT:    Dyspnea Pt seen today with complaints of dyspnea and edema two weeks ago.   History of ST elevation myocardial infarction (STEMI) 2019- CFX PCI/DES  CAD (coronary artery disease)  03/11/18: acute STEMI s/p DES to LCx  Myoview July 2020- low risk  Essential hypertension Normal LVF, moderate LVH and LAE Sept 2019  Dyslipidemia, goal LDL below 70 He has been only taking 1/2 his Lipitor to try and extend his Rx- he has been out at least a week.  Bipolar disorder (HCC) Pt tells me at times he may stay awake for days- this has been better recently  PLAN:    OK to stop Brilinta. Check labs, echo. Meds refilled.  F/U 6 weeks - he may need a referral to social services to arrange for primary care.    Medication Adjustments/Labs and Tests Ordered: Current medicines are reviewed at length with the patient today.  Concerns regarding medicines are outlined above.  Orders Placed This Encounter  Procedures  . Basic Metabolic Panel (BMET)  . CBC  . EKG 12-Lead  . ECHOCARDIOGRAM COMPLETE   No orders of the defined types were placed in this encounter.   Patient Instructions  Medication Instructions:  STOP- Brilinta  *If you need a refill on your cardiac medications before your next appointment, please call your pharmacy*   Lab Work: BMP and CBC  If you have labs (blood work) drawn today and your tests are completely normal, you will receive your results only by: Marland Kitchen MyChart Message (if you have MyChart) OR . A paper copy in the mail If you have any lab test that is abnormal or we need to change your treatment, we will call you to review the results.   Testing/Procedures: Your physician has requested that you have an echocardiogram. Echocardiography is a painless test that uses sound waves to create images of your  heart. It provides your doctor with information about the size and shape of your heart and how well your heart's chambers and valves are working. This procedure takes approximately one hour. There are no restrictions for this procedure.  Follow-Up: At [facility], you and your health needs are our priority.  As part of our continuing mission to provide you with exceptional heart care, we have created designated Provider Care Teams.  These Care Teams include your primary Cardiologist (physician) and Advanced Practice Providers (APPs -  Physician Assistants and Nurse Practitioners) who all work together to provide you with the care you need, when you need it.  We recommend signing up for the patient portal called "MyChart".  Sign up information is provided on this After Visit Summary.  MyChart is used to connect with patients for Virtual Visits (Telemedicine).  Patients are able to view lab/test results, encounter notes, upcoming appointments, etc.  Non-urgent messages can be sent to your provider as well.   To learn more about what you can do with MyChart, go to [email or web address].    Your next appointment:   6 week(s)  The format for your next appointment:   In Person  Provider:   Edd Fabian, NP        Signed, Corine Shelter, PA-C  08/03/2020 12:34 PM    [facility]

## 2020-08-03 NOTE — Assessment & Plan Note (Signed)
Pt tells me at times he may stay awake for days- this has been better recently

## 2020-08-03 NOTE — Telephone Encounter (Signed)
Pt and [place] called in and stated that pt had an appt today and all of his med should have renewed and sent to [facility] and wellness.  Also they have would like to know why pt was taken off of the [place] ?     Best number  [phone]

## 2020-08-03 NOTE — Assessment & Plan Note (Signed)
Normal LVF, moderate LVH and LAE Sept 2019

## 2020-08-03 NOTE — Assessment & Plan Note (Signed)
03/11/18: acute STEMI s/p DES to LCx  Myoview July 2020- low risk

## 2020-08-03 NOTE — Assessment & Plan Note (Signed)
He has been only taking 1/2 his Lipitor to try and extend his Rx- he has been out at least a week.

## 2020-08-03 NOTE — Telephone Encounter (Signed)
Patient had appointment with you today- see question below regarding Brilinta  Thank you! Will route to assistant helping today as well.

## 2020-08-03 NOTE — Assessment & Plan Note (Signed)
Pt seen today with complaints of dyspnea and edema two weeks ago.

## 2020-08-03 NOTE — Patient Instructions (Signed)
Medication Instructions:  STOP- Brilinta  *If you need a refill on your cardiac medications before your next appointment, please call your pharmacy*   Lab Work: BMP and CBC  If you have labs (blood work) drawn today and your tests are completely normal, you will receive your results only by: Marland Kitchen MyChart Message (if you have MyChart) OR . A paper copy in the mail If you have any lab test that is abnormal or we need to change your treatment, we will call you to review the results.   Testing/Procedures: Your physician has requested that you have an echocardiogram. Echocardiography is a painless test that uses sound waves to create images of your heart. It provides your doctor with information about the size and shape of your heart and how well your heart's chambers and valves are working. This procedure takes approximately one hour. There are no restrictions for this procedure.  Follow-Up: At [facility], you and your health needs are our priority.  As part of our continuing mission to provide you with exceptional heart care, we have created designated Provider Care Teams.  These Care Teams include your primary Cardiologist (physician) and Advanced Practice Providers (APPs -  Physician Assistants and Nurse Practitioners) who all work together to provide you with the care you need, when you need it.  We recommend signing up for the patient portal called "MyChart".  Sign up information is provided on this After Visit Summary.  MyChart is used to connect with patients for Virtual Visits (Telemedicine).  Patients are able to view lab/test results, encounter notes, upcoming appointments, etc.  Non-urgent messages can be sent to your provider as well.   To learn more about what you can do with MyChart, go to [email or web address].    Your next appointment:   6 week(s)  The format for your next appointment:   In Person  Provider:   Edd Fabian, NP

## 2020-08-03 NOTE — Assessment & Plan Note (Signed)
2019- CFX PCI/DES

## 2020-08-04 LAB — CBC
Hematocrit: 44.2 % (ref 37.5–51.0)
Hemoglobin: 15.2 g/dL (ref 13.0–17.7)
MCH: 31.3 pg (ref 26.6–33.0)
MCHC: 34.4 g/dL (ref 31.5–35.7)
MCV: 91 fL (ref 79–97)
Platelets: 370 10*3/uL (ref 150–450)
RBC: 4.86 x10E6/uL (ref 4.14–5.80)
RDW: 13.6 % (ref 11.6–15.4)
WBC: 9.7 10*3/uL (ref 3.4–10.8)

## 2020-08-04 LAB — BASIC METABOLIC PANEL
BUN/Creatinine Ratio: 13 (ref 9–20)
BUN: 8 mg/dL (ref 6–24)
CO2: 22 mmol/L (ref 20–29)
Calcium: 9.3 mg/dL (ref 8.7–10.2)
Chloride: 104 mmol/L (ref 96–106)
Creatinine, Ser: 0.61 mg/dL — ABNORMAL LOW (ref 0.76–1.27)
GFR calc Af Amer: 137 mL/min/{1.73_m2} (ref >59)
GFR calc non Af Amer: 118 mL/min/{1.73_m2} (ref >59)
Glucose: 89 mg/dL (ref 65–99)
Potassium: 4.6 mmol/L (ref 3.5–5.2)
Sodium: 142 mmol/L (ref 134–144)

## 2020-08-06 ENCOUNTER — Encounter: Payer: Self-pay | Admitting: *Deleted

## 2020-08-24 ENCOUNTER — Ambulatory Visit (HOSPITAL_COMMUNITY): Payer: Self-pay | Attending: Cardiology

## 2020-08-25 ENCOUNTER — Encounter (HOSPITAL_COMMUNITY): Payer: Self-pay | Admitting: Cardiology

## 2020-09-15 ENCOUNTER — Other Ambulatory Visit: Payer: Self-pay | Admitting: Family Medicine

## 2020-09-15 MED FILL — ATORVASTATIN CALCIUM 80 MG: 80 | 30 days supply | Qty: 30 | Fill #2

## 2020-09-15 MED FILL — ?METOPROLOL TARTRATE 25MG T: 25 | 30 days supply | Qty: 30 | Fill #2

## 2020-09-15 NOTE — Telephone Encounter (Signed)
Requested medication (s) are due for refill today: Yes  Requested medication (s) are on the active medication list: Yes  Last refill:  08/09/18  Future visit scheduled: No  Notes to clinic:  Prescription has expired. Last filled by [facility].    Requested Prescriptions  Pending Prescriptions Disp Refills   nitroGLYCERIN (NITROSTAT) 0.4 MG SL tablet [Pharmacy Med Name: NITROGLYCERIN 0.4 MG TAB SL 0.4 Tablet] 25 tablet 12    Sig: Place 1 tablet (0.4 mg total) under the tongue every 5 (five) minutes x 3 doses as needed for chest pain.      There is no refill protocol information for this order

## 2020-09-22 NOTE — Progress Notes (Deleted)
Cardiology Clinic Note   Patient Name: Dustin DraftFloyd T Scherzinger Jr. Date of Encounter: 09/22/2020  Primary Care Provider:  Cain SaupeFulp, Cammie, MD (Inactive) Primary Cardiologist:  Olga MillersBrian Crenshaw, MD  Patient Profile    Dustin DraftFloyd T [name] is a 50 y.o. male who presents for follow-up of his coronary artery disease.  Past Medical History    Past Medical History:  Diagnosis Date  . Anxiety   . Bipolar disorder (HCC)   . CAD (coronary artery disease)    a. 03/11/18: acute STEMI s/p DES to LCx   . Depression   . Dyspnea   . GERD (gastroesophageal reflux disease)    Past Surgical History:  Procedure Laterality Date  . CORONARY/GRAFT ACUTE MI REVASCULARIZATION N/A 03/10/2018   Procedure: Coronary/Graft Acute MI Revascularization;  Surgeon: Lennette BihariKelly, Thomas A, MD;  [facility];  Service: Cardiovascular;  Laterality: N/A;  . FOOT SURGERY    . LEFT HEART CATH AND CORONARY ANGIOGRAPHY N/A 03/10/2018   Procedure: LEFT HEART CATH AND CORONARY ANGIOGRAPHY;  Surgeon: Lennette BihariKelly, Thomas A, MD;  Location: [facility] CV [facility];  Service: Cardiovascular;  Laterality: N/A;  . NO PAST SURGERIES      Allergies  Allergies  Allergen Reactions  . Penicillins Rash    Has patient had a PCN reaction causing immediate rash, facial/tongue/throat swelling, SOB or lightheadedness with hypotension: YES Has patient had a PCN reaction causing severe rash involving mucus membranes or skin necrosis: NO Has patient had a PCN reaction that required hospitalization: NO Has patient had a PCN reaction occurring within the last 10 years: NO If all of the above answers are "NO", then may proceed with Cephalosporin use.  . Tramadol Hcl Hives    History of Present Illness    Mr. Dustin Olsen has a PMH of GERD, tobacco abuse, alcohol abuse, transaminitis, and bipolar disorder.  Mr. Dustin Olsen was seen by Dr. Jens Somrenshaw on 03/23/2018.  During that time he was having intermittent periods of mild chest discomfort that he attributed to  indigestion/GERD.  He stated that he had reflux symptoms  intermittently in the past.  He denied chest pain that was similar to his MI pain and also denied syncope.  Mr. Dustin Olsen underwent cardiac catheterization 03/10/2018 which showed  80% mid circumflex and 99% mid circumflex lesions.  A DES was successfully placed treating both of these lesions.  An echocardiogram on 03/10/2018 showed preserved ejection fraction (55 to 60%), LVH, a moderately dilated left atrium, and a right ventricle with mild dilation.     He presented to the clinic 01/09/19 and stated he  had substernal chest pain and scapular pain x3 days.  He stated that his chest pain started Friday and went through Sunday afternoon.  He described the pain as a persistent aching and rated at 7 out of 10.  He did not take any of his nitroglycerin but reported that the pain went away with rest.  He stated he did some yard work consisting of trimming bushes and burning, this activity did not make the chest pain increase or decrease.  He continued to take his Prilosec and stated he had not noticed an increase in his reflux symptoms.  He went on to state that he did not take his blood pressure or pulse while he was experiencing chest pain.  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  He continued to smoke 1 pack/day and stated that he did not do any physical activity outside of work. His stress test 01/18/2019 showed low risk, EF 50% and normal perfusion.  He was seen in follow-up by Corine Shelter, PA-C on 08/03/2020.  He reported he was receiving his medications from [facility].  He reported he was living with his mother and brother.  He was working occasionally doing Engineering geologist.  He denied chest pain.  He reported that a few weeks prior he was waking up with shortness of breath.  He felt as if  he could not sleep for around 5 days.  He also reported lower extremity swelling at that time.  He reported over the previous 2 weeks his symptoms have resolved.  In the office his blood pressure was controlled and he was euvolemic.  He reported that he did not follow a low-salt diet.  He had run out of several of his medications.  He has been cutting his Lipitor in half but had run out of as well.  His Brilinta was stopped.  His medication was refilled.  An echocardiogram was ordered but was not completed.  Labs unremarkable.  He presents to the clinic today for follow-up evaluation states***   Today he denies shortness of breath, lower extremity edema, syncope, presyncope, weakness, fatigue, hematuria, hemoptysis, orthopnea, and PND.  Home Medications    Prior to Admission medications   Medication Sig Start Date End Date Taking? Authorizing Provider  albuterol (VENTOLIN HFA) 108 (90 Base) MCG/ACT inhaler Inhale 2 puffs into the lungs every 6 (six) hours as needed for wheezing or shortness of breath. Patient not taking: Reported on 08/03/2020 01/07/19   Cain Saupe, MD  aspirin 81 MG chewable tablet Chew 1 tablet (81 mg total) by mouth daily. 08/09/18   Bing Neighbors, FNP  atorvastatin (LIPITOR) 80 MG tablet TAKE 1 TABLET (80 MG TOTAL) BY MOUTH DAILY AT 6 PM. **PATIENT NEEDS OFFICE VISIT FOR FUTURE REFILLS** 07/02/20 07/02/21  Ronney Asters, NP  metoprolol tartrate (LOPRESSOR) 25 MG tablet TAKE 1/2 TABLET (12.5 MG TOTAL) BY MOUTH 2 (TWO) TIMES DAILY. SCHEDULE OV FOR FURTHER REFILLS. 07/02/20   Ronney Asters, NP  naproxen (NAPROSYN) 375 MG tablet Take 1 tablet (375 mg total) by mouth 2 (two) times daily with a meal. 02/26/20   Wallis Bamberg, PA-C  nitroGLYCERIN (NITROSTAT) 0.4 MG SL tablet Place 1 tablet (0.4 mg total) under the tongue every 5 (five) minutes x 3 doses as needed for chest pain. Patient not taking: Reported on 08/03/2020 08/09/18   Bing Neighbors, FNP  omeprazole (PRILOSEC) 20 MG  capsule Take 1 capsule (20 mg total) by mouth 2 (two) times daily. 07/02/20   Ronney Asters, NP    Family History    Family History  Problem Relation Age of Onset  . Diabetes Mother   . Hyperlipidemia Father   . Hypertension Father   . Coronary artery disease Father        Stents placed  . Heart disease Maternal Grandmother   . Heart disease Paternal Grandmother    He indicated that the status of his mother is unknown. He indicated that the status of his father is unknown. He indicated that the status of his maternal grandmother is unknown. He indicated that the status of his paternal grandmother is unknown.  Social History    Social History   Socioeconomic History  .  Marital status: Single    Spouse name: Not on file  . Number of children: Not on file  . Years of education: Not on file  . Highest education level: Not on file  Occupational History  . Not on file  Tobacco Use  . Smoking status: Current Every Day Smoker    Packs/day: 1.00    Types: Cigarettes  . Smokeless tobacco: Never Used  Vaping Use  . Vaping Use: Never used  Substance and Sexual Activity  . Alcohol use: Yes    Alcohol/week: 6.0 standard drinks    Types: 6 Cans of beer per week    Comment: 6 pack per day  . Drug use: Yes    Types: Marijuana  . Sexual activity: Not on file  Other Topics Concern  . Not on file  Social History Narrative   ** Merged History Encounter **       Social Determinants of Health   Financial Resource Strain: Not on file  Food Insecurity: Not on file  Transportation Needs: Not on file  Physical Activity: Not on file  Stress: Not on file  Social Connections: Not on file  Intimate Partner Violence: Not on file     Review of Systems    General:  No chills, fever, night sweats or weight changes.  Cardiovascular:  No chest pain, dyspnea on exertion, edema, orthopnea, palpitations, paroxysmal nocturnal dyspnea. Dermatological: No rash, lesions/masses Respiratory: No  cough, dyspnea Urologic: No hematuria, dysuria Abdominal:   No nausea, vomiting, diarrhea, bright red blood per rectum, melena, or hematemesis Neurologic:  No visual changes, wkns, changes in mental status. All other systems reviewed and are otherwise negative except as noted above.  Physical Exam    VS:  There were no vitals taken for this visit. , BMI There is no height or weight on file to calculate BMI. GEN: Well nourished, well developed, in no acute distress. HEENT: normal. Neck: Supple, no JVD, carotid bruits, or masses. Cardiac: RRR, no murmurs, rubs, or gallops. No clubbing, cyanosis, edema.  Radials/DP/PT 2+ and equal bilaterally.  Respiratory:  Respirations regular and unlabored, clear to auscultation bilaterally. GI: Soft, nontender, nondistended, BS + x 4. MS: no deformity or atrophy. Skin: warm and dry, no rash. Neuro:  Strength and sensation are intact. Psych: Normal affect.  Accessory Clinical Findings    Recent Labs: 10/31/2019: ALT 29 08/03/2020: BUN 8; Creatinine, Ser 0.61; Hemoglobin 15.2; Platelets 370; Potassium 4.6; Sodium 142   Recent Lipid Panel    Component Value Date/Time   CHOL 151 04/10/2019 0947   TRIG 64 04/10/2019 0947   HDL 48 04/10/2019 0947   CHOLHDL 3.1 04/10/2019 0947   CHOLHDL 3.0 03/10/2018 0609   VLDL 17 03/10/2018 0609   LDLCALC 90 04/10/2019 0947    ECG personally reviewed by me today- *** - No acute changes  EKG 01/09/2019 Sinus tachycardia 108 bpm no ST or T wave deviation  Echocardiogram 07/10/2017 Study Conclusions   - Left ventricle: The cavity size was normal. Wall thickness was    increased in a pattern of moderate LVH. Systolic function was    normal. The estimated ejection fraction was in the range of 55%    to 60%. Wall motion was normal; there were no regional wall    motion abnormalities.  - Left atrium: The atrium was moderately dilated.  - Right ventricle: The cavity size was mildly dilated. Wall    thickness was  normal.  Cardiac catheterization: 03/10/2018  Prox RCA to  Mid RCA lesion is 10% stenosed.  Dist RCA lesion is 20% stenosed.  Prox Cx to Mid Cx lesion is 80% stenosed.  Mid Cx lesion is 99% stenosed.  A stent was successfully placed.  Post intervention, there is a 0% residual stenosis.   Post intervention, there is a 0% residual stenosis.  The left ventricular systolic function is normal.  LV end diastolic pressure is normal.  Nuclear stress test 01/18/2019  Normal perfusion No ischemia or scar  Nuclear stress EF: 50%.  This is a low risk study.  Assessment & Plan   1.  Coronary artery disease-no chest pain today.  No recent events of chest discomfort or activity intolerance.   Nuclear stress test 01/18/2019 showed low risk, EF 50%, and normal perfusion.  Underwent cardiac catheterization 2019 with PCI and DES to the circumflex. Continue 81 mg aspirin daily Heart healthy low-sodium diet Increase physical activity as tolerated   Dyspnea-denies increased work of breathing.  Euvolemic.  Has not had any recurrent episodes PND or orthopnea Are healthy low-sodium diet Daily weights Order echocardiogram    Pure hypercholesterolemia-04/10/2019: Cholesterol, Total 151; HDL 48; LDL Chol Calc (NIH) 90; Triglycerides 64 Continue atorvastatin 80 milligrams tablet daily Heart healthy low-sodium diet Increase physical activity as tolerated     Essential hypertension-BP today***.   Well-controlled at home. Continue metoprolol tartrate 12.5 mg tablet twice daily Heart healthy low-sodium diet Increase physical activity as tolerated Maintain blood pressure log and bring to next appointment.     GERD- intermittent symptoms Continue omeprazole 20 mg tablet twice daily Encouraged weight loss Avoid spicy foods and foods that precipitate reflux symptoms Recommend slight head of bed elevation Follow-up with PCP if symptoms persist and for further GERD management.   Bipolar  disorder-mood and affect appropriate today. Referral to social work for PCP  Disposition: Follow-up with Dr. Jens Som  or me in 6 months.   Thomasene Ripple. Ranay Ketter NP-C    09/22/2020, 6:11 AM [facility] [address] Office [phone] Fax [phone]  Notice: This dictation was prepared with Dragon dictation along with smaller phrase technology. Any transcriptional errors that result from this process are unintentional and may not be corrected upon review.  I spent***minutes examining this patient, reviewing medications, and using patient centered shared decision making involving her cardiac care.  Prior to her visit I spent greater than 20 minutes reviewing her past medical history,  medications, and prior cardiac tests.

## 2020-09-23 ENCOUNTER — Ambulatory Visit: Payer: Self-pay | Admitting: General Practice

## 2020-09-24 NOTE — Progress Notes (Deleted)
Cardiology Clinic Note   Patient Name: Dustin Olsen. Date of Encounter: 09/24/2020  Primary Care Provider:  Cain Saupe, MD (Inactive) Primary Cardiologist:  Olga Millers, MD  Patient Profile    Dustin Olsen. is a 50 y.o. male who presents for follow-up of his coronary artery disease.  Past Medical History    Past Medical History:  Diagnosis Date  . Anxiety   . Bipolar disorder (HCC)   . CAD (coronary artery disease)    a. 03/11/18: acute STEMI s/p DES to LCx   . Depression   . Dyspnea   . GERD (gastroesophageal reflux disease)    Past Surgical History:  Procedure Laterality Date  . CORONARY/GRAFT ACUTE MI REVASCULARIZATION N/A 03/10/2018   Procedure: Coronary/Graft Acute MI Revascularization;  Surgeon: Lennette Bihari, MD;  Location: [facility] INVASIVE CV LAB;  Service: Cardiovascular;  Laterality: N/A;  . FOOT SURGERY    . LEFT HEART CATH AND CORONARY ANGIOGRAPHY N/A 03/10/2018   Procedure: LEFT HEART CATH AND CORONARY ANGIOGRAPHY;  Surgeon: Lennette Bihari, MD;  Location: [facility] INVASIVE CV LAB;  Service: Cardiovascular;  Laterality: N/A;  . NO PAST SURGERIES      Allergies  Allergies  Allergen Reactions  . Penicillins Rash    Has patient had a PCN reaction causing immediate rash, facial/tongue/throat swelling, SOB or lightheadedness with hypotension: YES Has patient had a PCN reaction causing severe rash involving mucus membranes or skin necrosis: NO Has patient had a PCN reaction that required hospitalization: NO Has patient had a PCN reaction occurring within the last 10 years: NO If all of the above answers are "NO", then may proceed with Cephalosporin use.  . Tramadol Hcl Hives    History of Present Illness    [name] has a PMH of GERD, tobacco abuse, alcohol abuse, transaminitis, and bipolar disorder.  [name] was seen by Dr. Jens Som on 03/23/2018.  During that time he was having intermittent periods of mild chest discomfort that he attributed to  indigestion/GERD.  He stated that he had reflux symptoms  intermittently in the past.  He denied chest pain that was similar to his MI pain and also denied syncope.  [name] underwent cardiac catheterization 03/10/2018 which showed  80% mid circumflex and 99% mid circumflex lesions.  A DES was successfully placed treating both of these lesions.  An echocardiogram on 03/10/2018 showed preserved ejection fraction (55 to 60%), LVH, a moderately dilated left atrium, and a right ventricle with mild dilation.     He presented to the clinic 01/09/19 and stated he  had substernal chest pain and scapular pain x3 days.  He stated that his chest pain started Friday and went through Sunday afternoon.  He described the pain as a persistent aching and rated at 7 out of 10.  He did not take any of his nitroglycerin but reported that the pain went away with rest.  He stated he did some yard work consisting of trimming bushes and burning, this activity did not make the chest pain increase or decrease.  He continued to take his Prilosec and stated he had not noticed an increase in his reflux symptoms.  He went on to state that he did not take his blood pressure or pulse while he was experiencing chest pain.  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  He continued to smoke 1 pack/day and stated that he did not do any physical activity outside of work. His stress test 01/18/2019 showed low risk, EF 50% and normal perfusion.  He was seen in follow-up by Corine Shelter, PA-C on 08/03/2020.  He reported he was receiving his medications from [facility] health wellness.  He reported he was living with his mother and brother.  He was working occasionally doing Engineering geologist.  He denied chest pain.  He reported that a few weeks prior he was waking up with shortness of breath.  He felt as if  he could not sleep for around 5 days.  He also reported lower extremity swelling at that time.  He reported over the previous 2 weeks his symptoms have resolved.  In the office his blood pressure was controlled and he was euvolemic.  He reported that he did not follow a low-salt diet.  He had run out of several of his medications.  He has been cutting his Lipitor in half but had run out of as well.  His Brilinta was stopped.  His medication was refilled.  An echocardiogram was ordered but was not completed.  Labs unremarkable.  He presents to the clinic today for follow-up evaluation states***   Today he denies shortness of breath, lower extremity edema, syncope, presyncope, weakness, fatigue, hematuria, hemoptysis, orthopnea, and PND.  Home Medications    Prior to Admission medications   Medication Sig Start Date End Date Taking? Authorizing Provider  albuterol (VENTOLIN HFA) 108 (90 Base) MCG/ACT inhaler Inhale 2 puffs into the lungs every 6 (six) hours as needed for wheezing or shortness of breath. Patient not taking: Reported on 08/03/2020 01/07/19   Cain Saupe, MD  aspirin 81 MG chewable tablet Chew 1 tablet (81 mg total) by mouth daily. 08/09/18   Bing Neighbors, FNP  atorvastatin (LIPITOR) 80 MG tablet TAKE 1 TABLET (80 MG TOTAL) BY MOUTH DAILY AT 6 PM. **PATIENT NEEDS OFFICE VISIT FOR FUTURE REFILLS** 07/02/20 07/02/21  Ronney Asters, NP  metoprolol tartrate (LOPRESSOR) 25 MG tablet TAKE 1/2 TABLET (12.5 MG TOTAL) BY MOUTH 2 (TWO) TIMES DAILY. SCHEDULE OV FOR FURTHER REFILLS. 07/02/20   Ronney Asters, NP  naproxen (NAPROSYN) 375 MG tablet Take 1 tablet (375 mg total) by mouth 2 (two) times daily with a meal. 02/26/20   Wallis Bamberg, PA-C  nitroGLYCERIN (NITROSTAT) 0.4 MG SL tablet Place 1 tablet (0.4 mg total) under the tongue every 5 (five) minutes x 3 doses as needed for chest pain. Patient not taking: Reported on 08/03/2020 08/09/18   Bing Neighbors, FNP  omeprazole (PRILOSEC) 20 MG  capsule Take 1 capsule (20 mg total) by mouth 2 (two) times daily. 07/02/20   Ronney Asters, NP    Family History    Family History  Problem Relation Age of Onset  . Diabetes Mother   . Hyperlipidemia Father   . Hypertension Father   . Coronary artery disease Father        Stents placed  . Heart disease Maternal Grandmother   . Heart disease Paternal Grandmother    He indicated that the status of his mother is unknown. He indicated that the status of his father is unknown. He indicated that the status of his maternal grandmother is unknown. He indicated that the status of his paternal grandmother is unknown.  Social History    Social History   Socioeconomic History  .  Marital status: Single    Spouse name: Not on file  . Number of children: Not on file  . Years of education: Not on file  . Highest education level: Not on file  Occupational History  . Not on file  Tobacco Use  . Smoking status: Current Every Day Smoker    Packs/day: 1.00    Types: Cigarettes  . Smokeless tobacco: Never Used  Vaping Use  . Vaping Use: Never used  Substance and Sexual Activity  . Alcohol use: Yes    Alcohol/week: 6.0 standard drinks    Types: 6 Cans of beer per week    Comment: 6 pack per day  . Drug use: Yes    Types: Marijuana  . Sexual activity: Not on file  Other Topics Concern  . Not on file  Social History Narrative   ** Merged History Encounter **       Social Determinants of Health   Financial Resource Strain: Not on file  Food Insecurity: Not on file  Transportation Needs: Not on file  Physical Activity: Not on file  Stress: Not on file  Social Connections: Not on file  Intimate Partner Violence: Not on file     Review of Systems    General:  No chills, fever, night sweats or weight changes.  Cardiovascular:  No chest pain, dyspnea on exertion, edema, orthopnea, palpitations, paroxysmal nocturnal dyspnea. Dermatological: No rash, lesions/masses Respiratory: No  cough, dyspnea Urologic: No hematuria, dysuria Abdominal:   No nausea, vomiting, diarrhea, bright red blood per rectum, melena, or hematemesis Neurologic:  No visual changes, wkns, changes in mental status. All other systems reviewed and are otherwise negative except as noted above.  Physical Exam    VS:  There were no vitals taken for this visit. , BMI There is no height or weight on file to calculate BMI. GEN: Well nourished, well developed, in no acute distress. HEENT: normal. Neck: Supple, no JVD, carotid bruits, or masses. Cardiac: RRR, no murmurs, rubs, or gallops. No clubbing, cyanosis, edema.  Radials/DP/PT 2+ and equal bilaterally.  Respiratory:  Respirations regular and unlabored, clear to auscultation bilaterally. GI: Soft, nontender, nondistended, BS + x 4. MS: no deformity or atrophy. Skin: warm and dry, no rash. Neuro:  Strength and sensation are intact. Psych: Normal affect.  Accessory Clinical Findings    Recent Labs: 10/31/2019: ALT 29 08/03/2020: BUN 8; Creatinine, Ser 0.61; Hemoglobin 15.2; Platelets 370; Potassium 4.6; Sodium 142   Recent Lipid Panel    Component Value Date/Time   CHOL 151 04/10/2019 0947   TRIG 64 04/10/2019 0947   HDL 48 04/10/2019 0947   CHOLHDL 3.1 04/10/2019 0947   CHOLHDL 3.0 03/10/2018 0609   VLDL 17 03/10/2018 0609   LDLCALC 90 04/10/2019 0947    ECG personally reviewed by me today- *** - No acute changes  EKG 01/09/2019 Sinus tachycardia 108 bpm no ST or T wave deviation  Echocardiogram 07/10/2017 Study Conclusions   - Left ventricle: The cavity size was normal. Wall thickness was    increased in a pattern of moderate LVH. Systolic function was    normal. The estimated ejection fraction was in the range of 55%    to 60%. Wall motion was normal; there were no regional wall    motion abnormalities.  - Left atrium: The atrium was moderately dilated.  - Right ventricle: The cavity size was mildly dilated. Wall    thickness was  normal.  Cardiac catheterization: 03/10/2018  Prox RCA to  Mid RCA lesion is 10% stenosed.  Dist RCA lesion is 20% stenosed.  Prox Cx to Mid Cx lesion is 80% stenosed.  Mid Cx lesion is 99% stenosed.  A stent was successfully placed.  Post intervention, there is a 0% residual stenosis.   Post intervention, there is a 0% residual stenosis.  The left ventricular systolic function is normal.  LV end diastolic pressure is normal.  Nuclear stress test 01/18/2019  Normal perfusion No ischemia or scar  Nuclear stress EF: 50%.  This is a low risk study.  Assessment & Plan    1.  Coronary artery disease-no chest pain today.  No recent events of chest discomfort or activity intolerance.   Nuclear stress test 01/18/2019 showed low risk, EF 50%, and normal perfusion.  Underwent cardiac catheterization 2019 with PCI and DES to the circumflex. Continue 81 mg aspirin daily Heart healthy low-sodium diet Increase physical activity as tolerated   Dyspnea-denies increased work of breathing.  Euvolemic.  Has not had any recurrent episodes PND or orthopnea Are healthy low-sodium diet Daily weights Order echocardiogram    Pure hypercholesterolemia-04/10/2019: Cholesterol, Total 151; HDL 48; LDL Chol Calc (NIH) 90; Triglycerides 64 Continue atorvastatin 80 milligrams tablet daily Heart healthy low-sodium diet Increase physical activity as tolerated     Essential hypertension-BP today***.   Well-controlled at home. Continue metoprolol tartrate 12.5 mg tablet twice daily Heart healthy low-sodium diet Increase physical activity as tolerated Maintain blood pressure log and bring to next appointment.     GERD- intermittent symptoms Continue omeprazole 20 mg tablet twice daily Encouraged weight loss Avoid spicy foods and foods that precipitate reflux symptoms Recommend slight head of bed elevation Follow-up with PCP if symptoms persist and for further GERD management.   Bipolar  disorder-mood and affect appropriate today. Referral to social work for PCP  Disposition: Follow-up with Dr. Jens Som  or me in 6 months.   Thomasene Ripple. Jhase Creppel NP-C    09/24/2020, 7:41 AM [facility] [address] Office [phone] Fax [phone]  Notice: This dictation was prepared with Dragon dictation along with smaller phrase technology. Any transcriptional errors that result from this process are unintentional and may not be corrected upon review.  I spent***minutes examining this patient, reviewing medications, and using patient centered shared decision making involving her cardiac care.  Prior to her visit I spent greater than 20 minutes reviewing her past medical history,  medications, and prior cardiac tests.

## 2020-09-25 ENCOUNTER — Ambulatory Visit: Payer: Self-pay | Admitting: General Practice

## 2020-09-25 NOTE — Telephone Encounter (Signed)
Pt no showed for appt on 04/06(can) and 04/08(n/s) for medication therapy

## 2020-09-30 ENCOUNTER — Encounter: Payer: Self-pay | Admitting: Cardiology

## 2020-09-30 ENCOUNTER — Ambulatory Visit (HOSPITAL_COMMUNITY): Payer: Self-pay | Attending: Cardiology

## 2020-09-30 ENCOUNTER — Telehealth (HOSPITAL_COMMUNITY): Payer: Self-pay | Admitting: Cardiology

## 2020-09-30 NOTE — Telephone Encounter (Signed)
Just an FYI. We have made several attempts to contact this patient including sending a letter to schedule or reschedule their echocardiogram. We will be removing the patient from the echo WQ.   08/24/20 NO SHOW -MAILED LETTER [facility]   Thank you

## 2020-09-30 NOTE — Progress Notes (Unsigned)
Patient ID: Dustin Olsen., male   DOB: 1970/08/10, 50 y.o.   MRN: [id number]  Verified appointment "no show" status with Aaliyah at 11:43am.

## 2020-10-26 ENCOUNTER — Other Ambulatory Visit: Payer: Self-pay | Admitting: Nurse Practitioner

## 2020-10-26 ENCOUNTER — Other Ambulatory Visit: Payer: Self-pay

## 2020-10-26 DIAGNOSIS — F17200 Nicotine dependence, unspecified, uncomplicated: Secondary | ICD-10-CM

## 2020-10-26 DIAGNOSIS — R0602 Shortness of breath: Secondary | ICD-10-CM

## 2020-10-26 MED ORDER — ALBUTEROL SULFATE HFA 108 (90 BASE) MCG/ACT IN AERS
2.0000 | INHALATION_SPRAY | Freq: Four times a day (QID) | RESPIRATORY_TRACT | 5 refills | Status: AC | PRN
  Filled 2020-10-26 – 2020-11-26 (×2): qty 18, 25d supply, fill #0
  Filled 2021-01-11: qty 18, 25d supply, fill #1
  Filled 2021-02-10: qty 18, 25d supply, fill #2
  Filled 2021-10-20: qty 18, 25d supply, fill #0

## 2020-10-26 MED ORDER — NITROGLYCERIN 0.4 MG SL SUBL
0.4000 mg | SUBLINGUAL_TABLET | SUBLINGUAL | 12 refills | Status: DC | PRN
  Filled 2020-10-26: qty 25, 15d supply, fill #0
  Filled 2021-02-10: qty 25, 10d supply, fill #0
  Filled 2021-04-26: qty 25, 1d supply, fill #0
  Filled 2021-08-12: qty 25, 15d supply, fill #0

## 2020-10-26 MED FILL — Atorvastatin Calcium Tab 80 MG (Base Equivalent): ORAL | 30 days supply | Qty: 30 | Fill #0 | Status: AC

## 2020-10-27 ENCOUNTER — Telehealth: Payer: Self-pay | Admitting: Licensed Clinical Social Worker

## 2020-10-27 ENCOUNTER — Other Ambulatory Visit: Payer: Self-pay

## 2020-10-27 ENCOUNTER — Telehealth: Payer: Self-pay | Admitting: Cardiology

## 2020-10-27 DIAGNOSIS — R9389 Abnormal findings on diagnostic imaging of other specified body structures: Secondary | ICD-10-CM

## 2020-10-27 NOTE — Telephone Encounter (Signed)
LCSW called and left HIPAA compliant message w/ pt cell [phone]. Referral for pt assistance received.   Dustin Olsen, MSW, LCSW [facility] Health Heart/Vascular Care Navigation  [phone]

## 2020-10-27 NOTE — Telephone Encounter (Signed)
Patient's mom called and wanted to talk with Dr. Jens Som in regards to patient stopping taking Brilinta. Stated that she wants to hear from Dr. Jens Som to see if he approves

## 2020-10-27 NOTE — Telephone Encounter (Signed)
Spoke to patient's mother. She is concerned that patient was told to stop Brilinta  At last office in 07/24/20.  She wanted to know why it ws stopped.   She states she was concerned because her husband died shortly after he was taken off of Clopidogrel and she does not want the same to happen to her son.  RN informed Mrs Down- the reason was not indicated in the notes. RN informed patient was suppose to have an echo after that office visit. Mrs Nocito stated patient has had echo done . Because it would cost him $288 for testing . She states she did not have the money since husband died  And patient does not have the money because he is not working either.  ( patient was no show for appointments after Feb 2022)   RN asked Mrs Feutz if patient is stilling taking Brilinta. She stated yes. She states she is still receiving patient assistance medication from [employer],but  Wanted to know when they run out.  Mrs Fortner stated she did not feel comfortable having the patient stop  because 1 )what happen to her husband. 2) she stated that Dr Jens Som told her and the patient never stop Brilinta for anything.  RN informed Mrs Spychalski will defer to Dr Jens Som about the Brilinta.    RN  Asked if it would be okay if we could refer her son  For possible assistance in regards to testing if there is any. She verbalized yes it would be very appreciative

## 2020-10-27 NOTE — Telephone Encounter (Signed)
I have left message for pt, directly, to review eligibility for assistance programs.  I will give pt the opportunity to return my call before I call pt mother.   Dustin Olsen, MSW, LCSW [facility] Health Heart/Vascular Care Navigation  [phone]

## 2020-10-27 NOTE — Telephone Encounter (Signed)
Looks like this has been a patient of Dr. Landry Dyke. I have not seen since 2019. He has not had any intervention in greater than 1 year and therefore he could discontinue his Brilinta and continue aspirin 81 mg daily. In reviewing my last office visit I had recommended an MRI with and without contrast of his left kidney for mass and it does not appear that it was performed. This needs to be reviewed and scheduled if not performed previously. Will need fuov with me  Olga Millers

## 2020-10-27 NOTE — Telephone Encounter (Signed)
Spoke with pt mother, Aware of dr Ludwig Clarks recommendations. She is upset by this recommendations because of what happened in the past with her husband. Patient mother made aware that the recommendations that are made about medications are guidelines that are used for appropriate use of medications by the medical field after testing has been done to prove what is correct. She requested we take care of the MRI, Order placed and Follow up scheduled

## 2020-10-27 NOTE — Telephone Encounter (Signed)
Additional phone note opened in error.   Omere Marti, MSW, LCSW [place] [facility] Heart/Vascular Care Navigation  [phone]  

## 2020-10-28 ENCOUNTER — Telehealth: Payer: Self-pay | Admitting: Cardiology

## 2020-10-28 NOTE — Telephone Encounter (Signed)
Left message for patient to call and discuss scheduling the MRA abdomen/pelvis ordered by Dr Jens Som

## 2020-10-29 ENCOUNTER — Telehealth: Payer: Self-pay | Admitting: Licensed Clinical Social Worker

## 2020-10-29 NOTE — Telephone Encounter (Signed)
HIPAA compliant message left again for pt at [phone]. Will reattempt again as able.   Octavio Graves, MSW, LCSW [facility] Health Heart/Vascular Care Navigation  [phone]

## 2020-11-02 NOTE — Telephone Encounter (Signed)
Left message for patient to call and discuss scheduling the MRA abdomen and pelvis ordered by Dr. Jens Som

## 2020-11-03 ENCOUNTER — Other Ambulatory Visit: Payer: Self-pay

## 2020-11-03 ENCOUNTER — Telehealth: Payer: Self-pay | Admitting: Licensed Clinical Social Worker

## 2020-11-03 NOTE — Telephone Encounter (Signed)
LCSW still unable to reach pt at his phone [phone]. I called [name] at [phone] [facility] on file). Spoke briefly with her, she confirmed pt phone number and shared that she would be home around 2pm and have pt call me then. She shares that pt has not worked since 12/21. Pt could utilize any assistance that he may be eligible for. LCSW will note call for 2pm, remain available.   Dustin Olsen, MSW, LCSW [facility] Health Heart/Vascular Care Navigation  [phone]

## 2020-11-05 ENCOUNTER — Telehealth: Payer: Self-pay | Admitting: Licensed Clinical Social Worker

## 2020-11-05 NOTE — Telephone Encounter (Signed)
LCSW never heard back from pt after speaking with pt mother. Attempted both pt at [phone] and pt mother at [phone]. Pt mother voicemail full, was able to leave HIPAA compliant message on pt voicemail.   Octavio Graves, MSW, LCSW [facility] Health Heart/Vascular Care Navigation  [phone]

## 2020-11-06 ENCOUNTER — Telehealth: Payer: Self-pay | Admitting: Licensed Clinical Social Worker

## 2020-11-06 NOTE — Telephone Encounter (Signed)
Was able to reach pt mother this morning, she shares she requested the pt give me a call the other day, apologized he did not call me back and will ask him to call me again today. Shared that I appreciate her assistance. Remain available if/when the pt is able to call me.   Octavio Graves, MSW, LCSW [facility] Health Heart/Vascular Care Navigation  [phone]

## 2020-11-09 ENCOUNTER — Telehealth: Payer: Self-pay | Admitting: Licensed Clinical Social Worker

## 2020-11-09 NOTE — Telephone Encounter (Signed)
Called one more time for pt, no answer. Pt and pt mother have been contacted multiple times each (see notes from 5/10, 5/12, 5/17, 5/19 and 5/20). Still have not gotten a call back to assist with requested pt assistance. Remain available should pt contact me moving forward.  Octavio Graves, MSW, LCSW [facility] Health Heart/Vascular Care Navigation  [phone]

## 2020-11-12 ENCOUNTER — Telehealth: Payer: Self-pay | Admitting: Licensed Clinical Social Worker

## 2020-11-12 NOTE — Telephone Encounter (Signed)
Received call from pt this afternoon.  Pt confirmed home address, he currently lives with his mother. He previously had an [employer] but did not reapply and has not worked for the past few years. LCSW offered to meet with pt and review eligibility for assistance programs. Pt agreeable- open to meeting on 5/31 at 2pm. Have sent pt office address and time to meet.   Octavio Graves, MSW, LCSW [facility] Health Heart/Vascular Care Navigation  [phone]

## 2020-11-17 ENCOUNTER — Telehealth: Payer: Self-pay | Admitting: Licensed Clinical Social Worker

## 2020-11-17 NOTE — Progress Notes (Signed)
Heart and Vascular Care Navigation  11/17/2020  Dustin Olsen. 06/08/1971 [id number]  Reason for Referral:  Engaged with patient face to face for initial visit for Heart and Vascular Care Coordination.                                                                                              Assessment:            LCSW met with pt in the [facility] clinic to review assistance documents as requested by pt mother. I introduced myseslf, my role, and reason for completing these applications. Pt shares that he lives w/ his mother and does not drive (she takes him to appointments). He worked jobs off and on but has been out of work since October 2021. He gets his medications from [facility] and used to see Antony Blackbird, MD, at that clinic as well. Pt shares that he would like to keep his care all in the same building. LCSW offered an earlier appt with another community clinic but pt declines.   Pt currently w/ no income- he and his mother live off of her fixed retirement income. He is interested in getting enrolled in [employer] and information about additional food resources have been provided. Per pt and pt mother, pt mother assists with reading and comprehension of more high level documents. Pt shares he has never applied for Medicaid or disability. Inquired if he would like to complete any documents while in the office together- he declines and would rather complete them at home with mother.   LCSW reviewed how to complete each app and we went through each document needed to complete. Pt had previously completed the [employer] application so it is not too unfamiliar of a process for him. Answered all questions that he had in the moment, provided my card for any ongoing questions/concerns.                    HRT/VAS Care Coordination    Patients Home Cardiology Office [place] Team Social Worker   Social Worker Name: Margarito Liner [place],  [phone]   Living arrangements for the past 2 months Single Family Home   Lives with: Parents   Patient Current Insurance Coverage Self-Pay   Patient Has Concern With Paying Medical Bills Yes   Patient Concerns With Medical Bills ongoing medical care   Medical Bill Referrals: provided pt with CAFA and [employer] applications   Does Patient Have Prescription Coverage? No   Patient Prescription Assistance Programs Blue Card; [place] Med[employer]ist   Blue Card Medications pt receives meds through [place] [facility] [place] Medications provided pt with [facility] application   Home Assistive Devices/Equipment None      Social History:  SDOH Screenings   Alcohol Screen: Not on file  Depression (WVX4-2): Not on file  Financial Resource Strain: High Risk  . Difficulty of Paying Living Expenses: Very hard  Food Insecurity: Food Insecurity Present  . Worried About Charity fundraiser in the Last Year: Sometimes true  . Ran Out of Food in the Last Year: Sometimes true  Housing: Medium Risk  . Last Housing Risk Score: 1  Physical Activity: Not on file  Social Connections: Not on file  Stress: Not on file  Tobacco Use: High Risk  . Smoking Tobacco Use: Current Every Day Smoker  . Smokeless Tobacco Use: Never Used  Transportation Needs: Unmet Transportation Needs  . Lack of Transportation (Medical): Yes  . Lack of Transportation (Non-Medical): Yes    SDOH Interventions: Financial Resources:  Financial Strain Interventions: Other (Comment),Financial Counselor (reestablished pt w/ [facility]; provided pt with [place] [employer], CAFA and [employer].)  Food Insecurity:  Food Insecurity Interventions: Assist with [employer]  Housing Insecurity:   Pt mother is currently behind on Warehouse manager- will inquire about assistance from [place].  Transportation:   Transportation Interventions: Investment banker, operational    Other Care Navigation Interventions:     Provided Pharmacy assistance resources [employer]   Follow-up plan:   LCSW provided pt with my business cards and the following applications: [employer] Aid, [employer] (w/ [employer] note), how to complete Medicaid screening w/ DSS, SNAP referral to [employer] and food assistance.  I will f/u with pt next week to check in on process of applications.

## 2020-11-17 NOTE — Telephone Encounter (Signed)
Confirmed via text that pt able to make appointment this afternoon to review assistance applications.   Octavio Graves, MSW, LCSW [facility] Health Heart/Vascular Care Navigation  [phone]

## 2020-11-19 ENCOUNTER — Telehealth: Payer: Self-pay | Admitting: Licensed Clinical Social Worker

## 2020-11-19 NOTE — Telephone Encounter (Signed)
LCSW received a call from pt mother Dustin Olsen ([place] on file [phone]). She shares that pt did bring home assistance applications and she will be able to assist him with getting those completed. They are working on getting the letter of nonfiling from IRS. LCSW shared pt upcoming appointment with Bertram Denver, NP at [facility] (7/18 at 2:50pm). Pt mother and I discussed how she is balancing paying for patient needs since he is out of work, she shares it can be stressful financially. The [employer] bill has been challenging for her and so I offered to pass that by our Patient Care Fund for consideration. Pt mother provided me with account number and I shared I would call her back once I heard a response.   LCSW spoke with team lead and discussed need- I received approval for one time assistance w/ [employer] bill. LCSW able to pay bill, attempted to reach pt mother to confirm and was not able to leave a message due to full voicemail. LCSW will f/u in 1-2 for patient assistance applications provided to pt.  Dustin Olsen, MSW, LCSW [facility] Heart/Vascular Care Navigation  [phone]

## 2020-11-25 NOTE — Progress Notes (Signed)
HPI: Follow-up coronary artery disease.  Cardiac catheterization 9/19 revealed an 80% mid circumflex followed by 99% lesion.  Patient had PCI with a drug-eluting stent.  LV function was normal.  Echocardiogram showed normal LV function, moderate left atrial enlargement and mild right ventricular enlargement.  In reviewing records there is a CT scan from January 2019 showed a 2.2 cm lesion in the left kidney suspicious for neoplasm.  Dedicated abdominal MRI was recommended.    Nuclear study July 2020 showed ejection fraction 50% and normal perfusion.  Since last seen he has dyspnea on exertion unchanged.  Occasional chest pressure both with exertion and at rest.  He also notes bilateral lower extremity edema at times.  He denies syncope.  Current Outpatient Medications  Medication Sig Dispense Refill   albuterol (VENTOLIN HFA) 108 (90 Base) MCG/ACT inhaler Inhale 2 puffs into the lungs every 6 (six) hours as needed for wheezing or shortness of breath. 18 g 5   aspirin 81 MG chewable tablet Chew 1 tablet (81 mg total) by mouth daily.     atorvastatin (LIPITOR) 80 MG tablet TAKE 1 TABLET (80 MG TOTAL) BY MOUTH DAILY AT 6 PM. **PATIENT NEEDS OFFICE VISIT FOR FUTURE REFILLS** 30 tablet 11   metoprolol tartrate (LOPRESSOR) 25 MG tablet TAKE 1/2 TABLET (12.5 MG TOTAL) BY MOUTH 2 (TWO) TIMES DAILY. SCHEDULE OV FOR FURTHER REFILLS. 30 tablet 11   naproxen (NAPROSYN) 375 MG tablet Take 1 tablet (375 mg total) by mouth 2 (two) times daily with a meal. 30 tablet 0   nitroGLYCERIN (NITROSTAT) 0.4 MG SL tablet Place 1 tablet (0.4 mg total) under the tongue every 5 (five) minutes x 3 doses as needed for chest pain. 25 tablet 12   omeprazole (PRILOSEC) 20 MG capsule Take 1 capsule (20 mg total) by mouth 2 (two) times daily. 60 capsule 11   No current facility-administered medications for this visit.     Past Medical History:  Diagnosis Date   Anxiety    Bipolar disorder (HCC)    CAD (coronary artery  disease)    a. 03/11/18: acute STEMI s/p DES to LCx    Depression    Dyspnea    GERD (gastroesophageal reflux disease)     Past Surgical History:  Procedure Laterality Date   CORONARY/GRAFT ACUTE MI REVASCULARIZATION N/A 03/10/2018   Procedure: Coronary/Graft Acute MI Revascularization;  Surgeon: Lennette Bihari, MD;  Location: [facility] INVASIVE CV LAB;  Service: Cardiovascular;  Laterality: N/A;   FOOT SURGERY     LEFT HEART CATH AND CORONARY ANGIOGRAPHY N/A 03/10/2018   Procedure: LEFT HEART CATH AND CORONARY ANGIOGRAPHY;  Surgeon: Lennette Bihari, MD;  Location: [facility] INVASIVE CV LAB;  Service: Cardiovascular;  Laterality: N/A;   NO PAST SURGERIES      Social History   Socioeconomic History   Marital status: Single    Spouse name: Not on file   Number of children: Not on file   Years of education: Not on file   Highest education level: Not on file  Occupational History   Not on file  Tobacco Use   Smoking status: Every Day    Packs/day: 1.00    Pack years: 0.00    Types: Cigarettes   Smokeless tobacco: Never  Vaping Use   Vaping Use: Never used  Substance and Sexual Activity   Alcohol use: Yes    Alcohol/week: 6.0 standard drinks    Types: 6 Cans of beer per week  Comment: 6 pack per day   Drug use: Yes    Types: Marijuana   Sexual activity: Not on file  Other Topics Concern   Not on file  Social History Narrative   ** Merged History Encounter **       Social Determinants of Health   Financial Resource Strain: High Risk   Difficulty of Paying Living Expenses: Very hard  Food Insecurity: Geophysicist/field seismologist Present   Worried About Programme researcher, broadcasting/film/video in the Last Year: Sometimes true   Barista in the Last Year: Sometimes true  Transportation Needs: Personal assistant (Medical): Yes   Lack of Transportation (Non-Medical): Yes  Physical Activity: Not on file  Stress: Not on file  Social Connections: Not on file  Intimate Partner  Violence: Not on file    Family History  Problem Relation Age of Onset   Diabetes Mother    Hyperlipidemia Father    Hypertension Father    Coronary artery disease Father        Stents placed   Heart disease Maternal Grandmother    Heart disease Paternal Grandmother     ROS: no fevers or chills, productive cough, hemoptysis, dysphasia, odynophagia, melena, hematochezia, dysuria, hematuria, rash, seizure activity, orthopnea, PND,  claudication. Remaining systems are negative.  Physical Exam: Well-developed obesein no acute distress.  Skin is warm and dry.  HEENT is normal.  Neck is supple.  Chest is clear to auscultation with normal expansion.  Cardiovascular exam is regular rate and rhythm.  Abdominal exam nontender or distended. No masses palpated. Extremities show trace edema. neuro grossly intact  ECG-normal sinus rhythm at a rate of 97, nonspecific ST changes.  No change compared to January 09, 2019.  Personally reviewed  A/P  1 coronary artery disease-plan to continue aspirin and statin.    2 chest pain-occasional chest pressure.  We will arrange Lexiscan nuclear study for risk stratification.  Note his electrocardiogram shows no ST changes.  3 hyperlipidemia-continue statin.  Check lipids and liver.  4 history of renal mass-when I saw the patient in October 2019 we ordered an MRI to follow-up on lesion noted on CT scan.  This has not been performed.  We will reschedule.  5 tobacco abuse-patient counseled on discontinuing.  6 history of alcohol abuse-patient states he has discontinued.  7 lower extremity edema-patient describes bilateral lower extremity edema.  I have given him a prescription for Lasix 20 mg a day as needed.  Check potassium and renal function.  Repeat echocardiogram.  Olga Millers, MD

## 2020-11-26 ENCOUNTER — Other Ambulatory Visit: Payer: Self-pay

## 2020-11-26 MED FILL — Atorvastatin Calcium Tab 80 MG (Base Equivalent): ORAL | 30 days supply | Qty: 30 | Fill #1 | Status: AC

## 2020-12-07 ENCOUNTER — Telehealth: Payer: Self-pay | Admitting: Licensed Clinical Social Worker

## 2020-12-07 ENCOUNTER — Encounter: Payer: Self-pay | Admitting: *Deleted

## 2020-12-07 NOTE — Telephone Encounter (Signed)
-----   Message from Lewayne Bunting, MD sent at 11/27/2020  4:49 PM EDT ----- Regarding: FW: MRA Abdomen/Pelvis Khamila Bassinger  Can you call him and document that you spoke with him; see if he is going to proceed. Olga Millers  ----- Message ----- From: Leotis Pain Sent: 11/27/2020   1:48 PM EDT To: Freddi Starr, RN, Lewayne Bunting, MD Subject: MRA Abdomen/Pelvis                             The above patient has not responded to our scheduling efforts for the above testing.---Patient was called 10/28/20---11/02/20--11/17/20 and 11/24/20.  Please advise

## 2020-12-07 NOTE — Telephone Encounter (Signed)
LCSW attempted to reach pt this morning at [phone], no answer.  Left message for pt to remind him of appt on Wednesday and encouraged him to bring assistance applications to that appt so he can review with this Clinical research associate.   Octavio Graves, MSW, LCSW [facility] Heart/Vascular Care Navigation  [phone]

## 2020-12-07 NOTE — Telephone Encounter (Signed)
This encounter was created in error - please disregard.

## 2020-12-08 ENCOUNTER — Telehealth: Payer: Self-pay | Admitting: Licensed Clinical Social Worker

## 2020-12-08 NOTE — Telephone Encounter (Signed)
LCSW attempted to reach pt via telephone at [phone], no answer.  Sent text requesting pt bring assistance applications to appt in the morning.   Octavio Graves, MSW, LCSW [facility] Heart/Vascular Care Navigation  [phone]

## 2020-12-09 ENCOUNTER — Other Ambulatory Visit: Payer: Self-pay

## 2020-12-09 ENCOUNTER — Encounter: Payer: Self-pay | Admitting: Cardiology

## 2020-12-09 ENCOUNTER — Ambulatory Visit (INDEPENDENT_AMBULATORY_CARE_PROVIDER_SITE_OTHER): Payer: Self-pay | Admitting: Cardiology

## 2020-12-09 VITALS — BP 114/80 | HR 97 | Ht 70.0 in | Wt 247.0 lb

## 2020-12-09 DIAGNOSIS — R9389 Abnormal findings on diagnostic imaging of other specified body structures: Secondary | ICD-10-CM

## 2020-12-09 DIAGNOSIS — R079 Chest pain, unspecified: Secondary | ICD-10-CM

## 2020-12-09 DIAGNOSIS — I10 Essential (primary) hypertension: Secondary | ICD-10-CM

## 2020-12-09 DIAGNOSIS — I25118 Atherosclerotic heart disease of native coronary artery with other forms of angina pectoris: Secondary | ICD-10-CM

## 2020-12-09 DIAGNOSIS — E785 Hyperlipidemia, unspecified: Secondary | ICD-10-CM

## 2020-12-09 MED ORDER — FUROSEMIDE 20 MG PO TABS
20.0000 mg | ORAL_TABLET | Freq: Every day | ORAL | 3 refills | Status: DC | PRN
  Filled 2020-12-09: qty 30, 30d supply, fill #0
  Filled 2021-01-11: qty 30, 30d supply, fill #1
  Filled 2021-02-10: qty 30, 30d supply, fill #2
  Filled 2021-03-18: qty 30, 30d supply, fill #3

## 2020-12-09 MED ORDER — FUROSEMIDE 20 MG PO TABS
20.0000 mg | ORAL_TABLET | Freq: Every day | ORAL | 3 refills | Status: DC | PRN
Start: 2020-12-09 — End: 2020-12-09

## 2020-12-09 NOTE — Patient Instructions (Signed)
Medication Instructions:   START FUROSEMIDE 20 MG ONCE DAILY AS NEEDED FOR SWELLING  *If you need a refill on your cardiac medications before your next appointment, please call your pharmacy*   Lab Work:  Your physician recommends that you return for lab work FASTING  If you have labs (blood work) drawn today and your tests are completely normal, you will receive your results only by: MyChart Message (if you have MyChart) OR A paper copy in the mail If you have any lab test that is abnormal or we need to change your treatment, we will call you to review the results.   Testing/Procedures:  MRI OF THE ABD/PELVIS @ [facility]  Your physician has requested that you have a lexiscan myoview. For further information please visit [email or web address] Please follow instruction sheet, as given. [address]  Your physician has requested that you have an echocardiogram. Echocardiography is a painless test that uses sound waves to create images of your heart. It provides your doctor with information about the size and shape of your heart and how well your heart's chambers and valves are working. This procedure takes approximately one hour. There are no restrictions for this procedure. [address]   Follow-Up: At [facility], you and your health needs are our priority.  As part of our continuing mission to provide you with exceptional heart care, we have created designated Provider Care Teams.  These Care Teams include your primary Cardiologist (physician) and Advanced Practice Providers (APPs -  Physician Assistants and Nurse Practitioners) who all work together to provide you with the care you need, when you need it.  We recommend signing up for the patient portal called "MyChart".  Sign up information is provided on this After Visit Summary.  MyChart is used to connect with patients for Virtual Visits (Telemedicine).  Patients are able to  view lab/test results, encounter notes, upcoming appointments, etc.  Non-urgent messages can be sent to your provider as well.   To learn more about what you can do with MyChart, go to [email or web address].    Your next appointment:   6 month(s)  The format for your next appointment:   In Person  Provider:   Olga Millers, MD

## 2020-12-09 NOTE — Progress Notes (Signed)
Heart and Vascular Care Navigation  12/09/2020  Byrl Latin. 05-Dec-1970 [id number]  Reason for Referral:    Engaged with patient face to face for follow up visit for Heart and Vascular Care Coordination.                                                                                                   Assessment:        LCSW met with pt at the end of his appt here at [facility] today. Pt has brought paperwork and most supporting documents. He is aware he needs to get the letter of nonfiling for his taxes. I have faxed in the documents he brought.  Once that is received we can work on getting his documents submitted and an appt with Mikle Bosworth.   Pt acknowledged he has been getting my calls/texts, I asked for him if possible to acknowledge receipt by texting or calling me back. Pt states understanding, states that otherwise he and his mother are doing okay.   HRT/VAS Care Coordination     Patients Home Cardiology Office [facility]   Outpatient Care Team Social Worker   Social Worker Name: Esmeralda Links [place], [phone]   Living arrangements for the past 2 months Single Family Home   Lives with: Parents   Patient Current Insurance Coverage Self-Pay   Patient Has Concern With Paying Medical Bills Yes   Patient Concerns With Medical Bills ongoing medical care   Medical Bill Referrals: provided pt with CAFA and Orange Card applications   Does Patient Have Prescription Coverage? No   Patient Prescription Assistance Programs Blue Card; [place] Medassist   Blue Card Medications pt receives meds through [facility] at [facility]   [place] Medassist Medications provided pt with [place]edAssist application   Home Assistive Devices/Equipment None       Social History:                                                                             SDOH Screenings   Alcohol Screen: Not on file  Depression (PHQ2-9): Not on file  Financial Resource Strain: High Risk   Difficulty of Paying  Living Expenses: Very hard  Food Insecurity: Food Insecurity Present   Worried About Programme researcher, broadcasting/film/video in the Last Year: Sometimes true   Ran Out of Food in the Last Year: Sometimes true  Housing: Medium Risk   Last Housing Risk Score: 1  Physical Activity: Not on file  Social Connections: Not on file  Stress: Not on file  Tobacco Use: High Risk   Smoking Tobacco Use: Every Day   Smokeless Tobacco Use: Never  Transportation Needs: Unmet Transportation Needs   Lack of Transportation (Medical): Yes   Lack of Transportation (Non-Medical): Yes    SDOH Interventions: Financial Resources:  Financial Strain Interventions: Development worker, community, Other (Comment) (pt brought in paperwork for [employer]- still missing some documents pt will continue to gather those) Financial Counseling for [employer]     Follow-up plan:   I will f/u with pt again next week to see if any additional documents received. Pt with need for ongoing medical care but needs to collect all documents prior to [place] being able to submit for CAFA/Orange Card. LCSW remains available, pt now has two of my cards. Upcoming PCP appt scheduled as well for him to be reconnected with primary care team at [facility]

## 2020-12-09 NOTE — Addendum Note (Signed)
Addended by: Freddi Starr on: 12/09/2020 10:19 AM   Modules accepted: Orders

## 2020-12-18 ENCOUNTER — Telehealth: Payer: Self-pay | Admitting: Licensed Clinical Social Worker

## 2020-12-18 NOTE — Telephone Encounter (Signed)
F/u message sent to pt inquiring about missing documents for CAFA/Orange Card, no answer at this time. Cannot send pt application until all documents received.   Octavio Graves, MSW, LCSW [facility] Heart/Vascular Care Navigation  [phone]

## 2020-12-31 ENCOUNTER — Telehealth: Payer: Self-pay | Admitting: Licensed Clinical Social Worker

## 2020-12-31 NOTE — Telephone Encounter (Signed)
LCSW f/u with pt regarding missing documentation for assistance applications. Called [phone], no answer, message left.  Will continue to attempt to reach pt/encourage him to get paperwork completed.   Octavio Graves, MSW, LCSW [facility] Heart/Vascular Care Navigation  [phone]

## 2021-01-04 ENCOUNTER — Ambulatory Visit: Payer: Self-pay | Admitting: Nurse Practitioner

## 2021-01-11 ENCOUNTER — Other Ambulatory Visit: Payer: Self-pay

## 2021-01-11 MED FILL — Atorvastatin Calcium Tab 80 MG (Base Equivalent): ORAL | 30 days supply | Qty: 30 | Fill #2 | Status: AC

## 2021-01-12 ENCOUNTER — Telehealth: Payer: Self-pay | Admitting: Licensed Clinical Social Worker

## 2021-01-12 NOTE — Telephone Encounter (Signed)
Additional f/u message sent to pt phone regarding letter of nonfiling for [employer]. Pt has not reached out again to this writer since previous clinic visit. Remain available.   Octavio Graves, MSW, LCSW [facility] Heart/Vascular Care Navigation  [phone]

## 2021-01-21 ENCOUNTER — Ambulatory Visit: Payer: Self-pay | Admitting: Physician Assistant

## 2021-02-02 ENCOUNTER — Telehealth: Payer: Self-pay | Admitting: Licensed Clinical Social Worker

## 2021-02-02 NOTE — Telephone Encounter (Signed)
LCSW reached out to pt regarding appt tomorrow w/ [facility] (MyChart Video visit).  No answer. Left message reminding pt of appt and inquiring about letter of non-filing for [facility] Financial Assistance.  Unfortunately since pt has not responded to my calls/texts the application is now out of date- I have mailed him new copies of [employer] Assistance and [employer] along with instructions regarding missing documents. I sent pt mother back her documents which she had sent as they are not needed for applications. Remain available for any additional questions/concerns. At this time have had little engagement back from pt for support.   Octavio Graves, MSW, LCSW [facility] Heart/Vascular Care Navigation  [phone]

## 2021-02-03 ENCOUNTER — Ambulatory Visit: Payer: Self-pay | Admitting: Critical Care Medicine

## 2021-02-10 ENCOUNTER — Other Ambulatory Visit: Payer: Self-pay

## 2021-02-10 MED FILL — Atorvastatin Calcium Tab 80 MG (Base Equivalent): ORAL | 30 days supply | Qty: 30 | Fill #3 | Status: AC

## 2021-02-17 ENCOUNTER — Other Ambulatory Visit: Payer: Self-pay

## 2021-03-18 ENCOUNTER — Other Ambulatory Visit: Payer: Self-pay

## 2021-03-18 MED FILL — Atorvastatin Calcium Tab 80 MG (Base Equivalent): ORAL | 30 days supply | Qty: 30 | Fill #4 | Status: AC

## 2021-03-19 ENCOUNTER — Other Ambulatory Visit: Payer: Self-pay

## 2021-04-26 ENCOUNTER — Other Ambulatory Visit: Payer: Self-pay

## 2021-04-26 MED FILL — Atorvastatin Calcium Tab 80 MG (Base Equivalent): ORAL | 30 days supply | Qty: 30 | Fill #5 | Status: AC

## 2021-06-01 ENCOUNTER — Other Ambulatory Visit: Payer: Self-pay

## 2021-06-01 MED FILL — Atorvastatin Calcium Tab 80 MG (Base Equivalent): ORAL | 30 days supply | Qty: 30 | Fill #6 | Status: AC

## 2021-06-02 ENCOUNTER — Other Ambulatory Visit: Payer: Self-pay

## 2021-07-06 ENCOUNTER — Other Ambulatory Visit: Payer: Self-pay

## 2021-07-06 ENCOUNTER — Telehealth: Payer: Self-pay | Admitting: Adult Health

## 2021-07-06 ENCOUNTER — Other Ambulatory Visit: Payer: Self-pay | Admitting: General Practice

## 2021-07-06 DIAGNOSIS — K219 Gastro-esophageal reflux disease without esophagitis: Secondary | ICD-10-CM

## 2021-07-06 DIAGNOSIS — I10 Essential (primary) hypertension: Secondary | ICD-10-CM

## 2021-07-06 MED ORDER — ATORVASTATIN CALCIUM 80 MG PO TABS
80.0000 mg | ORAL_TABLET | Freq: Every day | ORAL | 11 refills | Status: DC
  Filled 2021-07-06: qty 30, 30d supply, fill #0
  Filled 2021-08-12: qty 30, 30d supply, fill #1
  Filled 2021-09-16: qty 30, 30d supply, fill #2
  Filled 2021-10-20: qty 30, 30d supply, fill #3
  Filled 2021-12-01: qty 30, 30d supply, fill #4
  Filled 2022-01-06: qty 30, 30d supply, fill #5
  Filled 2022-02-10: qty 30, 30d supply, fill #6
  Filled 2022-03-21: qty 30, 30d supply, fill #7
  Filled 2022-04-26: qty 30, 30d supply, fill #8
  Filled 2022-06-06: qty 30, 30d supply, fill #9

## 2021-07-06 MED ORDER — METOPROLOL TARTRATE 25 MG PO TABS
ORAL_TABLET | ORAL | 11 refills | Status: DC
  Filled 2021-07-06: qty 30, 30d supply, fill #0
  Filled 2021-08-12: qty 30, 30d supply, fill #1
  Filled 2021-09-16: qty 30, 30d supply, fill #2
  Filled 2021-10-20: qty 30, 30d supply, fill #3
  Filled 2021-12-01: qty 30, 30d supply, fill #4
  Filled 2022-01-06: qty 30, 30d supply, fill #5
  Filled 2022-02-10: qty 30, 30d supply, fill #6
  Filled 2022-03-21: qty 30, 30d supply, fill #7
  Filled 2022-04-26: qty 30, 30d supply, fill #8
  Filled 2022-06-06: qty 30, 30d supply, fill #9

## 2021-07-06 MED ORDER — OMEPRAZOLE 20 MG PO CPDR
20.0000 mg | DELAYED_RELEASE_CAPSULE | Freq: Two times a day (BID) | ORAL | 11 refills | Status: DC
  Filled 2021-07-06: qty 60, 30d supply, fill #0
  Filled 2021-08-12: qty 60, 30d supply, fill #1
  Filled 2021-09-16: qty 60, 30d supply, fill #2
  Filled 2021-10-20: qty 60, 30d supply, fill #3
  Filled 2021-12-01: qty 60, 30d supply, fill #4
  Filled 2022-01-06: qty 60, 30d supply, fill #5
  Filled 2022-02-10: qty 60, 30d supply, fill #6
  Filled 2022-03-21: qty 60, 30d supply, fill #7
  Filled 2022-04-26: qty 60, 30d supply, fill #8
  Filled 2022-06-06: qty 60, 30d supply, fill #9

## 2021-07-06 NOTE — Telephone Encounter (Signed)
Refills sent to [facility].

## 2021-07-06 NOTE — Telephone Encounter (Signed)
°*  STAT* If patient is at the pharmacy, call can be transferred to refill team.   1. Which medications need to be refilled? (please list name of each medication and dose if known) metoprolol tartrate (LOPRESSOR) 25 MG tablet  30 ds  omeprazole (PRILOSEC) 20 MG capsule  30 ds  atorvastatin (LIPITOR) 80 MG tablet (Expired)  30 ds  2. Which pharmacy/location (including street and city if local pharmacy) is medication to be sent to?  [facility]  Phone:  [phone] Fax:  [phone]   3. Do they need a 30 day or 90 day supply? 30 ds

## 2021-07-07 ENCOUNTER — Other Ambulatory Visit: Payer: Self-pay

## 2021-08-12 ENCOUNTER — Other Ambulatory Visit: Payer: Self-pay

## 2021-08-26 NOTE — Progress Notes (Unsigned)
Cardiology Clinic Note   Patient Name: Dustin Olsen. Date of Encounter: 08/26/2021  Primary Care Provider:  Pcp, No Primary Cardiologist:  Olga Millers, MD  Patient Profile    51 year old male with known history of coronary artery disease with most recent cardiac catheterization 919 which revealed an 80% mid circumflex followed by 99% lesion, he is status post PCI with a drug-eluting stent.  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  This was ordered by Dr. Jens Som on last office visit however has not been completed at the time of this office visit.  Past Medical History    Past Medical History:  Diagnosis Date   Anxiety    Bipolar disorder (HCC)    CAD (coronary artery disease)    a. 03/11/18: acute STEMI s/p DES to LCx    Depression    Dyspnea    GERD (gastroesophageal reflux disease)    Past Surgical History:  Procedure Laterality Date   CORONARY/GRAFT ACUTE MI REVASCULARIZATION N/A 03/10/2018   Procedure: Coronary/Graft Acute MI Revascularization;  Surgeon: Lennette Bihari, MD;  Location: [facility];  Service: Cardiovascular;  Laterality: N/A;   FOOT SURGERY     LEFT HEART CATH AND CORONARY ANGIOGRAPHY N/A 03/10/2018   Procedure: LEFT HEART CATH AND CORONARY ANGIOGRAPHY;  Surgeon: Lennette Bihari, MD;  Location: [facility];  Service: Cardiovascular;  Laterality: N/A;   NO PAST SURGERIES      Allergies  Allergies  Allergen Reactions   Penicillins Rash    Has patient had a PCN reaction causing immediate rash, facial/tongue/throat swelling, SOB or lightheadedness with hypotension: YES Has patient had a PCN reaction causing severe rash involving mucus membranes or skin necrosis: NO Has patient had a  PCN reaction that required hospitalization: NO Has patient had a PCN reaction occurring within the last 10 years: NO If all of the above answers are "NO", then may proceed with Cephalosporin use.   Tramadol Hcl Hives    History of Present Illness    [name] is a 51 year old male we are following for ongoing assessment and management of coronary artery disease status post drug-eluting stent to the mid circumflex in 2019, hypertension, ongoing tobacco abuse, chronic lower extremity edema hyperlipidemia.  Last seen by Dr. Jens Som on 12/09/2020 at which time an MRI of his abdomen was ordered because of an incidental finding of a 2.2 cm lesion in his left kidney, however this has not been completed.  Home Medications    Current Outpatient Medications  Medication Sig Dispense Refill   albuterol (VENTOLIN HFA) 108 (90 Base) MCG/ACT inhaler Inhale 2 puffs into the lungs every 6 (six) hours as needed for wheezing or shortness of breath. 18 g 5   aspirin 81 MG chewable tablet Chew 1 tablet (81 mg total) by mouth daily.     atorvastatin (LIPITOR) 80 MG tablet Take 1 tablet (80 mg total) by mouth daily. 30 tablet 11   furosemide (LASIX) 20 MG tablet Take 1 tablet (20 mg total) by mouth daily as needed. 30 tablet 3   metoprolol tartrate (LOPRESSOR) 25 MG tablet TAKE 1/2 TABLET (12.5 MG TOTAL) BY MOUTH 2 (TWO) TIMES DAILY. 30 tablet 11   naproxen (NAPROSYN) 375 MG tablet Take 1 tablet (375  mg total) by mouth 2 (two) times daily with a meal. 30 tablet 0   nitroGLYCERIN (NITROSTAT) 0.4 MG SL tablet Place 1 tablet (0.4 mg total) under the tongue every 5 (five) minutes x 3 doses as needed for chest pain. 25 tablet 12   omeprazole (PRILOSEC) 20 MG capsule Take 1 capsule (20 mg total) by mouth 2 (two) times daily. 60 capsule 11   No current facility-administered medications for this visit.     Family History    Family History  Problem Relation Age of Onset   Diabetes Mother    Hyperlipidemia Father     Hypertension Father    Coronary artery disease Father        Stents placed   Heart disease Maternal Grandmother    Heart disease Paternal Grandmother    He indicated that the status of his mother is unknown. He indicated that the status of his father is unknown. He indicated that the status of his maternal grandmother is unknown. He indicated that the status of his paternal grandmother is unknown.  Social History    Social History   Socioeconomic History   Marital status: Single    Spouse name: Not on file   Number of children: Not on file   Years of education: Not on file   Highest education level: Not on file  Occupational History   Not on file  Tobacco Use   Smoking status: Every Day    Packs/day: 1.00    Types: Cigarettes   Smokeless tobacco: Never  Vaping Use   Vaping Use: Never used  Substance and Sexual Activity   Alcohol use: Yes    Alcohol/week: 6.0 standard drinks    Types: 6 Cans of beer per week    Comment: 6 pack per day   Drug use: Yes    Types: Marijuana   Sexual activity: Not on file  Other Topics Concern   Not on file  Social History Narrative   ** Merged History Encounter **       Social Determinants of Health   Financial Resource Strain: High Risk   Difficulty of Paying Living Expenses: Very hard  Food Insecurity: Geophysicist/field seismologist Present   Worried About Programme researcher, broadcasting/film/video in the Last Year: Sometimes true   Barista in the Last Year: Sometimes true  Transportation Needs: Unmet Transportation Needs   Lack of Transportation (Medical): Yes   Lack of Transportation (Non-Medical): Yes  Physical Activity: Not on file  Stress: Not on file  Social Connections: Not on file  Intimate Partner Violence: Not on file     Review of Systems    General:  No chills, fever, night sweats or weight changes.  Cardiovascular:  No chest pain, dyspnea on exertion, edema, orthopnea, palpitations, paroxysmal nocturnal dyspnea. Dermatological: No rash,  lesions/masses Respiratory: No cough, dyspnea Urologic: No hematuria, dysuria Abdominal:   No nausea, vomiting, diarrhea, bright red blood per rectum, melena, or hematemesis Neurologic:  No visual changes, wkns, changes in mental status. All other systems reviewed and are otherwise negative except as noted above.     Physical Exam    VS:  There were no vitals taken for this visit. , BMI There is no height or weight on file to calculate BMI.     GEN: Well nourished, well developed, in no acute distress. HEENT: normal. Neck: Supple, no JVD, carotid bruits, or masses. Cardiac: RRR, no murmurs, rubs, or gallops. No clubbing, cyanosis, edema.  Radials/DP/PT  2+ and equal bilaterally.  Respiratory:  Respirations regular and unlabored, clear to auscultation bilaterally. GI: Soft, nontender, nondistended, BS + x 4. MS: no deformity or atrophy. Skin: warm and dry, no rash. Neuro:  Strength and sensation are intact. Psych: Normal affect.  Accessory Clinical Findings    ECG personally reviewed by me today- *** - No acute changes  Lab Results  Component Value Date   WBC 9.7 08/03/2020   HGB 15.2 08/03/2020   HCT 44.2 08/03/2020   MCV 91 08/03/2020   PLT 370 08/03/2020   Lab Results  Component Value Date   CREATININE 0.61 (L) 08/03/2020   BUN 8 08/03/2020   NA 142 08/03/2020   K 4.6 08/03/2020   CL 104 08/03/2020   CO2 22 08/03/2020   Lab Results  Component Value Date   ALT 29 10/31/2019   AST 33 10/31/2019   ALKPHOS 87 10/31/2019   BILITOT 0.7 10/31/2019   Lab Results  Component Value Date   CHOL 151 04/10/2019   HDL 48 04/10/2019   LDLCALC 90 04/10/2019   TRIG 64 04/10/2019   CHOLHDL 3.1 04/10/2019    Lab Results  Component Value Date   HGBA1C 5.4 03/10/2018    Review of Prior Studies:  Echocardiogram 03/10/2018 Left ventricle: The cavity size was normal. Wall thickness was    increased in a pattern of moderate LVH. Systolic function was    normal. The  estimated ejection fraction was in the range of 55%    to 60%. Wall motion was normal; there were no regional wall    motion abnormalities.  - Left atrium: The atrium was moderately dilated.  - Right ventricle: The cavity size was mildly dilated. Wall    thickness was normal.   Cardiac Cath 03/10/2018 Prox RCA to Mid RCA lesion is 10% stenosed. Dist RCA lesion is 20% stenosed. Prox Cx to Mid Cx lesion is 80% stenosed. Mid Cx lesion is 99% stenosed. A stent was successfully placed. Post intervention, there is a 0% residual stenosis. Post intervention, there is a 0% residual stenosis. The left ventricular systolic function is normal. LV end diastolic pressure is normal.  Diagnostic Dominance: Right Intervention   Assessment & Plan   1.  ***    Current medicines are reviewed at length with the patient today.  I have spent *** min's  dedicated to the care of this patient on the date of this encounter to include pre-visit review of records, assessment, management and diagnostic testing,with shared decision making. Signed, Bettey MareKathryn M. Liborio NixonLawrence DNP, ANP, AACC   08/26/2021 5:17 PM    [facility] [address] Office [phone] Fax [phone]  Notice: This dictation was prepared with Dragon dictation along with smaller phrase technology. Any transcriptional errors that result from this process are unintentional and may not be corrected upon review.

## 2021-08-27 ENCOUNTER — Ambulatory Visit: Payer: Self-pay | Admitting: Adult Health

## 2021-09-01 ENCOUNTER — Encounter: Payer: Self-pay | Admitting: Adult Health

## 2021-09-16 ENCOUNTER — Other Ambulatory Visit: Payer: Self-pay

## 2021-09-17 ENCOUNTER — Other Ambulatory Visit: Payer: Self-pay

## 2021-10-20 ENCOUNTER — Other Ambulatory Visit: Payer: Self-pay

## 2021-12-01 ENCOUNTER — Other Ambulatory Visit: Payer: Self-pay

## 2022-01-06 ENCOUNTER — Other Ambulatory Visit: Payer: Self-pay

## 2022-02-10 ENCOUNTER — Other Ambulatory Visit: Payer: Self-pay

## 2022-03-21 ENCOUNTER — Other Ambulatory Visit: Payer: Self-pay

## 2022-04-26 ENCOUNTER — Other Ambulatory Visit: Payer: Self-pay

## 2022-06-06 ENCOUNTER — Other Ambulatory Visit: Payer: Self-pay

## 2022-06-30 ENCOUNTER — Other Ambulatory Visit: Payer: Self-pay | Admitting: Nurse Practitioner

## 2022-06-30 ENCOUNTER — Telehealth: Payer: Self-pay | Admitting: Cardiology

## 2022-06-30 ENCOUNTER — Other Ambulatory Visit: Payer: Self-pay

## 2022-06-30 MED ORDER — NITROGLYCERIN 0.4 MG SL SUBL
SUBLINGUAL_TABLET | SUBLINGUAL | 12 refills | Status: AC
  Filled 2022-06-30: qty 25, fill #0

## 2022-06-30 MED ORDER — FUROSEMIDE 20 MG PO TABS
20.0000 mg | ORAL_TABLET | Freq: Every day | ORAL | 0 refills | Status: DC
  Filled 2022-06-30: qty 15, 15d supply, fill #0

## 2022-06-30 NOTE — Telephone Encounter (Signed)
Spoke with mother and informed her of orders for patient as per C. Gilford Rile, NP. She had no questions or concerns at this time. Orders placed for lasix and NTG.

## 2022-06-30 NOTE — Telephone Encounter (Signed)
May Rx Lasix 20mg  daily for 15 tablets with no refills. Further refills will be provided at clinic visit. As previously discussed with Dr. Stanford Breed, recommend he abstain from alcohol. Recommend elevating legs, wearing compression stockings, following low salt diet. If he has recurrent chest pain needs to use nitroglycerin as prescribed (okay to provide refill if needed) and seek emergency services if chest pain unresolved after 3 nitroglycerin tablets.   Loel Dubonnet, NP

## 2022-06-30 NOTE — Telephone Encounter (Signed)
Spoke with patient's mother who stated patient has been "lit for the past 3 days." She stated he drinks alcohol a lot. She stated his "stomach is huge and his ankles are huge." Patient FX left great toe and ribs recently. Two nights ago, mother saw patient hold his chest. She wasn't sure if it was from the ribs or heart. Patient did not use NTG at the time. Mother concerned because of the ankle edema. He has gained 5 pounds in a week. He needs refill of lasix. His last OV was June 2022. Made appointment with Vella Raring for 1/15. Please advise.

## 2022-06-30 NOTE — Telephone Encounter (Signed)
Pt c/o swelling: STAT is pt has developed SOB within 24 hours  If swelling, where is the swelling located?   Both feet to ankle  How much weight have you gained and in what time span?  5 lbs in a week  Have you gained 3 pounds in a day or 5 pounds in a week?  5 lbs in a week  Do you have a log of your daily weights (if so, list)?   No  Are you currently taking a fluid pill?   No  Are you currently SOB?   No  Have you traveled recently?  No   Mother stated patient brought this swelling to her attention and the patient's feet are red.  Mother stated the patient recently broke his toe.

## 2022-07-01 ENCOUNTER — Other Ambulatory Visit: Payer: Self-pay

## 2022-07-04 ENCOUNTER — Ambulatory Visit (INDEPENDENT_AMBULATORY_CARE_PROVIDER_SITE_OTHER): Payer: Self-pay | Admitting: Family

## 2022-07-04 ENCOUNTER — Encounter (HOSPITAL_BASED_OUTPATIENT_CLINIC_OR_DEPARTMENT_OTHER): Payer: Self-pay | Admitting: Family

## 2022-07-04 ENCOUNTER — Encounter (HOSPITAL_BASED_OUTPATIENT_CLINIC_OR_DEPARTMENT_OTHER): Payer: Self-pay | Admitting: Cardiology

## 2022-07-04 VITALS — BP 128/88 | HR 91 | Ht 70.0 in | Wt 221.9 lb

## 2022-07-04 DIAGNOSIS — R600 Localized edema: Secondary | ICD-10-CM

## 2022-07-04 DIAGNOSIS — E785 Hyperlipidemia, unspecified: Secondary | ICD-10-CM

## 2022-07-04 DIAGNOSIS — R079 Chest pain, unspecified: Secondary | ICD-10-CM

## 2022-07-04 DIAGNOSIS — R0609 Other forms of dyspnea: Secondary | ICD-10-CM

## 2022-07-04 DIAGNOSIS — I25118 Atherosclerotic heart disease of native coronary artery with other forms of angina pectoris: Secondary | ICD-10-CM

## 2022-07-04 NOTE — Patient Instructions (Addendum)
Medication Instructions:  Your physician has recommended you make the following change in your medication:    May take [facility] as needed for vertigo, dizziness  May take MELATONIN 5-10mg  thirty minutes prior to sleep to help with sleep.   Continue FUROSEMIDE (LASIX) 20mg  as needed for swelling.   *If you need a refill on your cardiac medications before your next appointment, please call your pharmacy*   Lab Work: Your physician recommends that you return for lab work today: CMP, CBC, direct LDL   If you have labs (blood work) drawn today and your tests are completely normal, you will receive your results only by: MyChart Message (if you have MyChart) OR A paper copy in the mail If you have any lab test that is abnormal or we need to change your treatment, we will call you to review the results.   Testing/Procedures: Your physician has requested that you have a exercise myoview.  Please follow instruction sheet, as given.  Please arrive 15 minutes prior to your appointment time for registration and insurance purposes.   The test will take approximately 3 to 4 hours to complete; you may bring reading material. If someone comes with you to your appointment, they will need to remain in the main lobby due to limited testing space in the testing area. **If you are pregnant or breastfeeding, please notify the nuclear lab prior to your appointment**   How to prepare for your test:  - Do not eat or drink 3 hours prior to your test, except you may have water  - Do not consume products containing caffeine ( regular or decaf) 12 hours prior to your test ( coffee, chocolate, sodas or teas)  - Do bring a list of your current medications with you. If not listed below, you may take your medications as normal (Hold beta blocker- 24 hour prior for exercise myoview)   - Do wear comfortable clothes (no dresses or overalls) and walking shoes ( tennis shoes preferred), no heel or open toe shoes are  allowed - Do not wear cologne, perfume, aftershave, or lotions ( deodorant is allowed)  - If these instructions are not followed, your test will be rescheduled   If you cannot keep your appointment, please provide 24 hours notice to the Nuclear Lab, to avoid a possible $50 charge to your account!    Follow-Up: At [facility], you and your health needs are our priority.  As part of our continuing mission to provide you with exceptional heart care, we have created designated Provider Care Teams.  These Care Teams include your primary Cardiologist (physician) and Advanced Practice Providers (APPs -  Physician Assistants and Nurse Practitioners) who all work together to provide you with the care you need, when you need it.  We recommend signing up for the patient portal called "MyChart".  Sign up information is provided on this After Visit Summary.  MyChart is used to connect with patients for Virtual Visits (Telemedicine).  Patients are able to view lab/test results, encounter notes, upcoming appointments, etc.  Non-urgent messages can be sent to your provider as well.   To learn more about what you can do with MyChart, go to [email or web address].    Your next appointment:   4 month(s)  Provider:   Kirk Ruths, MD     Other Instructions  To prevent or reduce lower extremity swelling: Eat a low salt diet. Salt makes the body hold onto extra fluid which causes swelling. Sit with legs elevated.  For example, in the recliner or on an Lindsay.  Wear knee-high compression stockings during the daytime. Ones labeled 15-20 mmHg provide good compression.   Managing the Challenge of Quitting Smoking Quitting smoking is a physical and mental challenge. You may have cravings, withdrawal symptoms, and temptation to smoke. Before quitting, work with your health care provider to make a plan that can help you manage quitting. Making a plan before you quit may keep you from smoking when you have  the urge to smoke while trying to quit. How to manage lifestyle changes Managing stress Stress can make you want to smoke, and wanting to smoke may cause stress. It is important to find ways to manage your stress. You could try some of the following: Practice relaxation techniques. Breathe slowly and deeply, in through your nose and out through your mouth. Listen to music. Soak in a bath or take a shower. Imagine a peaceful place or vacation. Get some support. Talk with family or friends about your stress. Join a support group. Talk with a counselor or therapist. Get some physical activity. Go for a walk, run, or bike ride. Play a favorite sport. Practice yoga.  Medicines Talk with your health care provider about medicines that might help you deal with cravings and make quitting easier for you. Relationships Social situations can be difficult when you are quitting smoking. To manage this, you can: Avoid parties and other social situations where people might be smoking. Avoid alcohol. Leave right away if you have the urge to smoke. Explain to your family and friends that you are quitting smoking. Ask for support and let them know you might be a bit grumpy. Plan activities where smoking is not an option. General instructions Be aware that many people gain weight after they quit smoking. However, not everyone does. To keep from gaining weight, have a plan in place before you quit, and stick to the plan after you quit. Your plan should include: Eating healthy snacks. When you have a craving, it may help to: Eat popcorn, or try carrots, celery, or other cut vegetables. Chew sugar-free gum. Changing how you eat. Eat small portion sizes at meals. Eat 4-6 small meals throughout the day instead of 1-2 large meals a day. Be mindful when you eat. You should avoid watching television or doing other things that might distract you as you eat. Exercising regularly. Make time to exercise each  day. If you do not have time for a long workout, do short bouts of exercise for 5-10 minutes several times a day. Do some form of strengthening exercise, such as weight lifting. Do some exercise that gets your heart beating and causes you to breathe deeply, such as walking fast, running, swimming, or biking. This is very important. Drinking plenty of water or other low-calorie or no-calorie drinks. Drink enough fluid to keep your urine pale yellow.  How to recognize withdrawal symptoms Your body and mind may experience discomfort as you try to get used to not having nicotine in your system. These effects are called withdrawal symptoms. They may include: Feeling hungrier than normal. Having trouble concentrating. Feeling irritable or restless. Having trouble sleeping. Feeling depressed. Craving a cigarette. These symptoms may surprise you, but they are normal to have when quitting smoking. To manage withdrawal symptoms: Avoid places, people, and activities that trigger your cravings. Remember why you want to quit. Get plenty of sleep. Avoid coffee and other drinks that contain caffeine. These may worsen some of your symptoms. How  to manage cravings Come up with a plan for how to deal with your cravings. The plan should include the following: A definition of the specific situation you want to deal with. An activity or action you will take to replace smoking. A clear idea for how this action will help. The name of someone who could help you with this. Cravings usually last for 5-10 minutes. Consider taking the following actions to help you with your plan to deal with cravings: Keep your mouth busy. Chew sugar-free gum. Suck on hard candies or a straw. Brush your teeth. Keep your hands and body busy. Change to a different activity right away. Squeeze or play with a ball. Do an activity or a hobby, such as making bead jewelry, practicing needlepoint, or working with wood. Mix up your  normal routine. Take a short exercise break. Go for a quick walk, or run up and down stairs. Focus on doing something kind or helpful for someone else. Call a friend or family member to talk during a craving. Join a support group. Contact a quitline. Where to find support To get help or find a support group: Call the National Cancer Institute's Smoking Quitline: 1-800-QUIT-NOW 785-177-2129) Text QUIT to SmokefreeTXT: 630160 Where to find more information Visit these websites to find more information on quitting smoking: U.S. Department of Health and Human Services: www.smokefree.gov American Lung Association: www.freedomfromsmoking.org Centers for Disease Control and Prevention (CDC): FootballExhibition.com.br American Heart Association: www.heart.org Contact a health care provider if: You want to change your plan for quitting. The medicines you are taking are not helping. Your eating feels out of control or you cannot sleep. You feel depressed or become very anxious. Summary Quitting smoking is a physical and mental challenge. You will face cravings, withdrawal symptoms, and temptation to smoke again. Preparation can help you as you go through these challenges. Try different techniques to manage stress, handle social situations, and prevent weight gain. You can deal with cravings by keeping your mouth busy (such as by chewing gum), keeping your hands and body busy, calling family or friends, or contacting a quitline for people who want to quit smoking. You can deal with withdrawal symptoms by avoiding places where people smoke, getting plenty of rest, and avoiding drinks that contain caffeine. This information is not intended to replace advice given to you by your health care provider. Make sure you discuss any questions you have with your health care provider. Document Revised: 05/28/2021 Document Reviewed: 05/28/2021 Elsevier Patient Education  2023 [employer].

## 2022-07-04 NOTE — Progress Notes (Signed)
Office Visit    Patient Name: Hrithik Boschee. Date of Encounter: 07/04/2022  PCP:  Kathyrn Lass   [place]  Cardiologist:  Kirk Ruths, MD  Advanced Practice Provider:  Deberah Pelton, NP Electrophysiologist:  None      Chief Complaint    Lowell Bouton. is a 52 y.o. male presents today for edema.   Past Medical History    Past Medical History:  Diagnosis Date   Anxiety    Bipolar disorder ([place])    CAD (coronary artery disease)    a. 03/11/18: acute STEMI s/p DES to LCx    Depression    Dyspnea    GERD (gastroesophageal reflux disease)    Past Surgical History:  Procedure Laterality Date   CORONARY/GRAFT ACUTE MI REVASCULARIZATION N/A 03/10/2018   Procedure: Coronary/Graft Acute MI Revascularization;  Surgeon: Troy Sine, MD;  Location: [place] [facility];  Service: Cardiovascular;  Laterality: N/A;   FOOT SURGERY     LEFT HEART CATH AND CORONARY ANGIOGRAPHY N/A 03/10/2018   Procedure: LEFT HEART CATH AND CORONARY ANGIOGRAPHY;  Surgeon: Troy Sine, MD;  Location: [place] [facility];  Service: Cardiovascular;  Laterality: N/A;   NO PAST SURGERIES      Allergies  Allergies  Allergen Reactions   Penicillins Rash    Has patient had a PCN reaction causing immediate rash, facial/tongue/throat swelling, SOB or lightheadedness with hypotension: YES Has patient had a PCN reaction causing severe rash involving mucus membranes or skin necrosis: NO Has patient had a PCN reaction that required hospitalization: NO Has patient had a PCN reaction occurring within the last 10 years: NO If all of the above answers are "NO", then may proceed with Cephalosporin use.   Tramadol Hcl Hives    History of Present Illness    Arias Weinert. is a 52 y.o. male with a hx of CAD (02/2018 DES-Cx), HLD, tobacco abuse, prior etoh use, LE edema last seen 12/09/20.  CT 06/2017 2.2 lesion in left kidney suspicious for neoplasm recommended for abdominal MRI.  Cardiac catheterization 02/2018 80% mid Cx followed by 99% lesion s/p PCI with DES. Echo normal LVEF, moderate LAE, mild RV enlargement. Nuclear study 12/2018 LVEF 50% and normal perfusion.   Lats seen 12/09/20. Dyspnea on exertion was unchanged and noted intermittent lower extremity edema. Due to intermittent chest pain lexiscan ordered. Lasix 20mg  PRN provided for edema and echocardiogram ordered. He did not complete myoview or echocardiogram.   He presents today for follow up independently. He states that he fell down the steps 2 months ago and broke his ribs. No lightheadedness, dizziness, syncope. Says his Mother wanted him to come today for swelling in his lower extremities. Says he has been having chest pressure for the last few days but also that it is ongoing for years. He has not been sleeping and goes days without sleepig due to being afraid he won't wake up. Says he quits breathing in his sleep but is not sure how he knows this. Shares with me that he only sleeps well if he drinks 7-8 beers. Works part time in remodeling.  EKGs/Labs/Other Studies Reviewed:   The following studies were reviewed today:  Myoview 12/2018 Normal perfusion No ischemia or scar Nuclear stress EF: 50%. This is a low risk study.   Echo 02/2018  - Left ventricle: The cavity size was normal. Wall thickness was    increased in a pattern of moderate  LVH. Systolic function was    normal. The estimated ejection fraction was in the range of 55%    to 60%. Wall motion was normal; there were no regional wall    motion abnormalities.  - Left atrium: The atrium was moderately dilated.  - Right ventricle: The cavity size was mildly dilated. Wall    thickness was normal.   Coronary revascularization 02/2018 Prox RCA to Mid RCA lesion is 10% stenosed. Dist RCA lesion is 20% stenosed. Prox Cx to Mid Cx lesion is 80% stenosed. Mid Cx lesion is 99% stenosed. A stent was successfully placed. Post intervention, there is a 0%  residual stenosis. Post intervention, there is a 0% residual stenosis. The left ventricular systolic function is normal. LV end diastolic pressure is normal.    Acute ST segment elevation myocardial infarction secondary to subtotal stenosis of the mid left circumflex coronary artery.   Normal left anterior descending artery.   Mild nonobstructive disease in the RCA with 10% mild luminal irregularity in the mid segment and 20% smooth distal stenosis.   Preserved LV contractility with an ejection fraction of 55% without definitive wall motion abnormalities.  LVEDP 17 mmHg.   Successful PCI to the mid circumflex treated with PTCA and insertion of a 2.5 x 28 mm Xience Sierra DES stent postdilated to 2.75 mm with the tandem 80 and 99% stenosis being reduced to 0%.   RECOMMENDATION: Recommend uninterrupted dual antiplatelet therapy with Aspirin 81mg  daily and Ticagrelor 90mg  twice daily for a minimum of 12 months (ACS - Class I recommendation).  Patient will be started on post MI beta-blocker therapy with possible ACE inhibition depending upon blood pressure.  High potency statin therapy will be initiated.  Smoking cessation is essential.  EKG:  EKG is ordered today.  The ekg ordered today demonstrates NSR 91 bpm with no acute ST/T wave changes.   Recent Labs: No results found for requested labs within last 365 days.  Recent Lipid Panel    Component Value Date/Time   CHOL 151 04/10/2019 0947   TRIG 64 04/10/2019 0947   HDL 48 04/10/2019 0947   CHOLHDL 3.1 04/10/2019 0947   CHOLHDL 3.0 03/10/2018 0609   VLDL 17 03/10/2018 0609   LDLCALC 90 04/10/2019 0947   Home Medications   Current Meds  Medication Sig   albuterol (VENTOLIN HFA) 108 (90 Base) MCG/ACT inhaler Inhale 2 puffs into the lungs every 6 (six) hours as needed for wheezing or shortness of breath.   aspirin 81 MG chewable tablet Chew 1 tablet (81 mg total) by mouth daily.   atorvastatin (LIPITOR) 80 MG tablet Take 1 tablet  (80 mg total) by mouth daily.   furosemide (LASIX) 20 MG tablet Take 1 tablet (20 mg total) by mouth daily.   metoprolol tartrate (LOPRESSOR) 25 MG tablet TAKE 1/2 TABLET (12.5 MG TOTAL) BY MOUTH 2 (TWO) TIMES DAILY.   naproxen (NAPROSYN) 375 MG tablet Take 1 tablet (375 mg total) by mouth 2 (two) times daily with a meal.   nitroGLYCERIN (NITROSTAT) 0.4 MG SL tablet For chest pain, tightness, or pressure. While sitting, place 1 tablet under tongue. May be used every 5 minutes as needed, for up to 15 minutes. Do not use more than 3 tablets.   omeprazole (PRILOSEC) 20 MG capsule Take 1 capsule (20 mg total) by mouth 2 (two) times daily.    Review of Systems     All other systems reviewed and are otherwise negative except as noted above.  Physical Exam    VS:  BP 128/88 (BP Location: Right Arm, Patient Position: Sitting, Cuff Size: Normal)   Pulse 91   Ht 5\' 10"  (1.778 m)   Wt 221 lb 14.4 oz (100.7 kg)   BMI 31.84 kg/m  , BMI Body mass index is 31.84 kg/m.  Wt Readings from Last 3 Encounters:  07/04/22 221 lb 14.4 oz (100.7 kg)  12/09/20 247 lb (112 kg)  08/03/20 226 lb 9.6 oz (102.8 kg)    GEN: Well nourished, overweight, well developed, in no acute distress. HEENT: normal. Neck: Supple, no JVD, carotid bruits, or masses. Cardiac: RRR, no murmurs, rubs, or gallops. No clubbing, cyanosis, edema.  Radials/PT 2+ and equal bilaterally.  Respiratory:  Respirations regular and unlabored, clear to auscultation bilaterally. GI: Soft, nontender, nondistended. MS: No deformity or atrophy. Skin: Warm and dry, no rash. Neuro:  Strength and sensation are intact. Psych: Normal affect.  Assessment & Plan    CAD s/p DES-Cx 2019 - Notes 2 week history of chest pressure. However, also notes history of chest pain over 2 years. EKG today no acute ST/T wave changes. GDMT includes aspirin, atorvastatin, metoprolol. Heart healthy diet and regular cardiovascular exercise encouraged.  Plan for exercise  myoview. Update CMP, CBC.   Shared Decision Making/Informed Consent{  The risks [chest pain, shortness of breath, cardiac arrhythmias, dizziness, blood pressure fluctuations, myocardial infarction, stroke/transient ischemic attack, nausea, vomiting, allergic reaction, radiation exposure, metallic taste sensation and life-threatening complications (estimated to be 1 in 10,000)], benefits (risk stratification, diagnosing coronary artery disease, treatment guidance) and alternatives of a nuclear stress test were discussed in detail with Mr. Limehouse and he agrees to proceed.   HLD, LDL goal <70 - Continue Atorvastatin 80mg  daily. Update direct LDL today.  Snores / Sleep disordered breathing - Politely declines sleep study today.   LE edema - Nonpitting LE edema on exam. Continue Lasix 20mg  PRN. To avoid multiple testing, will reconsider echocardiogram at follow up.   ETOH use - Complete cessation encouraged.  Tobacco use - Smoking cessation encouraged. Recommend utilization of [id number].       Disposition: Follow up in 4 month(s) with Kirk Ruths, MD or APP.  Signed, Loel Dubonnet, NP 07/04/2022, 2:46 PM [facility]

## 2022-07-05 ENCOUNTER — Telehealth (HOSPITAL_BASED_OUTPATIENT_CLINIC_OR_DEPARTMENT_OTHER): Payer: Self-pay

## 2022-07-05 DIAGNOSIS — E785 Hyperlipidemia, unspecified: Secondary | ICD-10-CM

## 2022-07-05 LAB — COMPREHENSIVE METABOLIC PANEL
ALT: 62 IU/L — ABNORMAL HIGH (ref 0–44)
AST: 45 IU/L — ABNORMAL HIGH (ref 0–40)
Albumin/Globulin Ratio: 1.8 (ref 1.2–2.2)
Albumin: 4.6 g/dL (ref 4.1–5.1)
Alkaline Phosphatase: 100 IU/L (ref 44–121)
BUN/Creatinine Ratio: 11 (ref 9–20)
BUN: 8 mg/dL (ref 6–24)
Bilirubin Total: 0.8 mg/dL (ref 0.0–1.2)
CO2: 24 mmol/L (ref 20–29)
Calcium: 9.6 mg/dL (ref 8.7–10.2)
Chloride: 97 mmol/L (ref 96–106)
Creatinine, Ser: 0.73 mg/dL — ABNORMAL LOW (ref 0.76–1.27)
Globulin, Total: 2.6 g/dL (ref 1.5–4.5)
Glucose: 94 mg/dL (ref 70–99)
Potassium: 4.2 mmol/L (ref 3.5–5.2)
Sodium: 134 mmol/L (ref 134–144)
Total Protein: 7.2 g/dL (ref 6.0–8.5)
eGFR: 111 mL/min/{1.73_m2} (ref >59)

## 2022-07-05 LAB — CBC
Hematocrit: 45.3 % (ref 37.5–51.0)
Hemoglobin: 15.6 g/dL (ref 13.0–17.7)
MCH: 31 pg (ref 26.6–33.0)
MCHC: 34.4 g/dL (ref 31.5–35.7)
MCV: 90 fL (ref 79–97)
Platelets: 370 10*3/uL (ref 150–450)
RBC: 5.03 x10E6/uL (ref 4.14–5.80)
RDW: 12.8 % (ref 11.6–15.4)
WBC: 10.5 10*3/uL (ref 3.4–10.8)

## 2022-07-05 LAB — LDL CHOLESTEROL, DIRECT: LDL Direct: 66 mg/dL (ref 0–99)

## 2022-07-05 NOTE — Telephone Encounter (Addendum)
Left message for patient to call back        ----- Message from Caitlin S Walker, NP sent at 07/05/2022 10:02 AM EST ----- Stable kidney function. LIver enzymes mildly elevated - ensure avoiding alcohol, fatty foods, Acetaminophen (tylenol). CBC with no evidence of anemia nor infection.  Direct LDL at goal of <70. Continue same medications.    Repeat liver enzymes in 1 month for monitoring.    

## 2022-07-06 ENCOUNTER — Telehealth (HOSPITAL_COMMUNITY): Payer: Self-pay | Admitting: *Deleted

## 2022-07-06 ENCOUNTER — Telehealth: Payer: Self-pay | Admitting: Licensed Clinical Social Worker

## 2022-07-06 NOTE — Telephone Encounter (Signed)
Left message on voicemail in reference to upcoming appointment scheduled for 07/13/22 Phone number given for a call back so details instructions can be given. Kirstie Peri, RN

## 2022-07-06 NOTE — Telephone Encounter (Addendum)
H&V Care Navigation CSW Progress Note  Clinical Social Worker  mailed copay of Medicaid expansion flyer, [employer] Assistance and [employer] application  to pt as continues to be uninsured with ongoing reestablished medical needs. I remain available should pt/pt family have questions. Pt did not continue with assistance previously. Also mailed PCP packet as currently not seeing a provider for PCP.   Patient is participating in a Managed Medicaid Plan:  No, self pay only.   SDOH Screenings   Food Insecurity: Food Insecurity Present (11/17/2020)  Housing: Medium Risk (11/17/2020)  Transportation Needs: Unmet Transportation Needs (11/17/2020)  Financial Resource Strain: High Risk (11/17/2020)  Tobacco Use: High Risk (07/04/2022)   Westley Hummer, MSW, [place]  [phone]- work cell phone (preferred) [phone]- desk phone

## 2022-07-08 NOTE — Telephone Encounter (Signed)
Left message for patient to call back        ----- Message from Loel Dubonnet, NP sent at 07/05/2022 10:02 AM EST ----- Stable kidney function. LIver enzymes mildly elevated - ensure avoiding alcohol, fatty foods, Acetaminophen (tylenol). CBC with no evidence of anemia nor infection.  Direct LDL at goal of <70. Continue same medications.    Repeat liver enzymes in 1 month for monitoring.

## 2022-07-11 ENCOUNTER — Encounter (HOSPITAL_BASED_OUTPATIENT_CLINIC_OR_DEPARTMENT_OTHER): Payer: Self-pay

## 2022-07-11 NOTE — Addendum Note (Signed)
Addended by: Gerald Stabs on: 07/11/2022 01:04 PM   Modules accepted: Orders

## 2022-07-11 NOTE — Telephone Encounter (Signed)
3rd call attempt, no answer, left message to call back. Will mail lab results and follow up labs to patient.

## 2022-07-13 ENCOUNTER — Other Ambulatory Visit: Payer: Self-pay | Admitting: General Practice

## 2022-07-13 ENCOUNTER — Ambulatory Visit (HOSPITAL_COMMUNITY): Payer: Self-pay

## 2022-07-13 ENCOUNTER — Other Ambulatory Visit: Payer: Self-pay

## 2022-07-13 DIAGNOSIS — I10 Essential (primary) hypertension: Secondary | ICD-10-CM

## 2022-07-13 DIAGNOSIS — K219 Gastro-esophageal reflux disease without esophagitis: Secondary | ICD-10-CM

## 2022-07-14 ENCOUNTER — Other Ambulatory Visit: Payer: Self-pay

## 2022-07-14 ENCOUNTER — Telehealth: Payer: Self-pay

## 2022-07-14 MED ORDER — OMEPRAZOLE 20 MG PO CPDR
20.0000 mg | DELAYED_RELEASE_CAPSULE | Freq: Two times a day (BID) | ORAL | 1 refills | Status: DC
  Filled 2022-07-14: qty 180, 90d supply, fill #0

## 2022-07-14 MED ORDER — ATORVASTATIN CALCIUM 80 MG PO TABS
80.0000 mg | ORAL_TABLET | Freq: Every day | ORAL | 11 refills | Status: DC
  Filled 2022-07-14: qty 30, 30d supply, fill #0
  Filled 2022-08-29: qty 30, 30d supply, fill #1
  Filled 2022-10-10: qty 30, 30d supply, fill #2
  Filled 2022-11-30: qty 30, 30d supply, fill #3
  Filled 2023-01-21: qty 30, 30d supply, fill #4
  Filled 2023-02-17 (×2): qty 30, 30d supply, fill #5
  Filled 2023-03-27: qty 30, 30d supply, fill #6
  Filled 2023-05-30 (×2): qty 30, 30d supply, fill #7

## 2022-07-14 MED ORDER — METOPROLOL TARTRATE 25 MG PO TABS
12.5000 mg | ORAL_TABLET | Freq: Two times a day (BID) | ORAL | 11 refills | Status: DC
  Filled 2022-07-14: qty 90, 90d supply, fill #0
  Filled 2022-10-10: qty 90, 90d supply, fill #1
  Filled 2023-01-26: qty 30, 30d supply, fill #2
  Filled 2023-01-26: qty 90, 90d supply, fill #2
  Filled 2023-02-17 (×2): qty 30, 30d supply, fill #3
  Filled 2023-03-27 – 2023-03-31 (×2): qty 30, 30d supply, fill #4
  Filled 2023-05-30 (×2): qty 30, 30d supply, fill #5

## 2022-07-14 NOTE — Telephone Encounter (Signed)
Detailed instructions left on the patient's answering machine. Asked to call back with any questions. S.Virgil Slinger EMTP/CCT 

## 2022-07-15 ENCOUNTER — Other Ambulatory Visit: Payer: Self-pay

## 2022-07-18 ENCOUNTER — Ambulatory Visit (HOSPITAL_COMMUNITY): Payer: Self-pay | Attending: Family

## 2022-07-18 DIAGNOSIS — I25118 Atherosclerotic heart disease of native coronary artery with other forms of angina pectoris: Secondary | ICD-10-CM | POA: Insufficient documentation

## 2022-07-18 DIAGNOSIS — R079 Chest pain, unspecified: Secondary | ICD-10-CM | POA: Insufficient documentation

## 2022-07-18 DIAGNOSIS — R0609 Other forms of dyspnea: Secondary | ICD-10-CM | POA: Insufficient documentation

## 2022-07-18 LAB — MYOCARDIAL PERFUSION IMAGING
Angina Index: 0
Duke Treadmill Score: 7
Estimated workload: 8.5
Exercise duration (min): 7 min
LV dias vol: 91 mL (ref 62–150)
LV sys vol: 37 mL
MPHR: 170 {beats}/min
Nuc Stress EF: 59 %
Peak HR: 164 {beats}/min
Percent HR: 96 %
RPE: 19
Rest HR: 77 {beats}/min
Rest Nuclear Isotope Dose: 10.1 mCi
SDS: 1
SRS: 0
SSS: 1
ST Depression (mm): 0 mm
Stress Nuclear Isotope Dose: 32.7 mCi
TID: 0.93

## 2022-07-18 MED ORDER — TECHNETIUM TC 99M TETROFOSMIN IV KIT
10.1000 | PACK | Freq: Once | INTRAVENOUS | Status: AC | PRN
  Administered 2022-07-18: 10.1 via INTRAVENOUS

## 2022-07-18 MED ORDER — TECHNETIUM TC 99M TETROFOSMIN IV KIT
32.7000 | PACK | Freq: Once | INTRAVENOUS | Status: AC | PRN
  Administered 2022-07-18: 32.7 via INTRAVENOUS

## 2022-07-19 ENCOUNTER — Telehealth (HOSPITAL_BASED_OUTPATIENT_CLINIC_OR_DEPARTMENT_OTHER): Payer: Self-pay

## 2022-07-19 NOTE — Telephone Encounter (Addendum)
Left message for patient to call back    ----- Message from Loel Dubonnet, NP sent at 07/18/2022  8:02 PM EST ----- Stress test low risk. Normal heart muscle function. Good result!

## 2022-07-21 NOTE — Telephone Encounter (Signed)
2nd call attempt, no answer,Left message for patient to call back       ----- Message from Loel Dubonnet, NP sent at 07/18/2022  8:02 PM EST ----- Stress test low risk. Normal heart muscle function. Good result!

## 2022-07-22 ENCOUNTER — Encounter (HOSPITAL_BASED_OUTPATIENT_CLINIC_OR_DEPARTMENT_OTHER): Payer: Self-pay

## 2022-07-22 NOTE — Telephone Encounter (Signed)
3rd call attempt, no answer,Left message for patient to call back, results mailed to patient.        ----- Message from Loel Dubonnet, NP sent at 07/18/2022  8:02 PM EST ----- Stress test low risk. Normal heart muscle function. Good result!

## 2022-08-29 ENCOUNTER — Other Ambulatory Visit: Payer: Self-pay

## 2022-10-10 ENCOUNTER — Other Ambulatory Visit: Payer: Self-pay | Admitting: Cardiology

## 2022-10-10 ENCOUNTER — Other Ambulatory Visit: Payer: Self-pay

## 2022-10-10 DIAGNOSIS — K219 Gastro-esophageal reflux disease without esophagitis: Secondary | ICD-10-CM

## 2022-10-11 ENCOUNTER — Other Ambulatory Visit: Payer: Self-pay

## 2022-10-11 MED ORDER — OMEPRAZOLE 20 MG PO CPDR
20.0000 mg | DELAYED_RELEASE_CAPSULE | Freq: Two times a day (BID) | ORAL | 1 refills | Status: DC
  Filled 2022-10-11: qty 60, 30d supply, fill #0
  Filled 2022-11-30: qty 90, 45d supply, fill #0
  Filled 2023-01-21: qty 90, 45d supply, fill #1

## 2022-10-18 ENCOUNTER — Other Ambulatory Visit: Payer: Self-pay

## 2022-11-10 NOTE — Progress Notes (Deleted)
HPI: Follow-up coronary artery disease.  Cardiac catheterization 9/19 revealed an 80% mid circumflex followed by 99% lesion.  Patient had PCI with a drug-eluting stent.  LV function was normal.  Echocardiogram showed normal LV function, moderate left atrial enlargement and mild right ventricular enlargement.  In reviewing records there is a CT scan from January 2019 showed a 2.2 cm lesion in the left kidney suspicious for neoplasm.  Dedicated abdominal MRI was recommended.  This was previously scheduled twice but he did not pursue.  Patient seen with chest pain January 2024.  Nuclear study January 2024 showed ejection fraction 59% and normal perfusion.  Since last seen   Current Outpatient Medications  Medication Sig Dispense Refill   albuterol (VENTOLIN HFA) 108 (90 Base) MCG/ACT inhaler Inhale 2 puffs into the lungs every 6 (six) hours as needed for wheezing or shortness of breath. 18 g 5   aspirin 81 MG chewable tablet Chew 1 tablet (81 mg total) by mouth daily.     atorvastatin (LIPITOR) 80 MG tablet Take 1 tablet (80 mg total) by mouth daily. 30 tablet 11   furosemide (LASIX) 20 MG tablet Take 1 tablet (20 mg total) by mouth daily. 15 tablet 0   metoprolol tartrate (LOPRESSOR) 25 MG tablet Take 0.5 tablets (12.5 mg total) by mouth 2 (two) times daily. 30 tablet 11   naproxen (NAPROSYN) 375 MG tablet Take 1 tablet (375 mg total) by mouth 2 (two) times daily with a meal. 30 tablet 0   nitroGLYCERIN (NITROSTAT) 0.4 MG SL tablet For chest pain, tightness, or pressure. While sitting, place 1 tablet under tongue. May be used every 5 minutes as needed, for up to 15 minutes. Do not use more than 3 tablets. 25 tablet 12   omeprazole (PRILOSEC) 20 MG capsule Take 1 capsule (20 mg total) by mouth 2 (two) times daily. 90 capsule 1   No current facility-administered medications for this visit.     Past Medical History:  Diagnosis Date   Anxiety    Bipolar disorder ([facility])    CAD (coronary artery  disease)    a. 03/11/18: acute STEMI s/p DES to LCx    Depression    Dyspnea    GERD (gastroesophageal reflux disease)     Past Surgical History:  Procedure Laterality Date   CORONARY/GRAFT ACUTE MI REVASCULARIZATION N/A 03/10/2018   Procedure: Coronary/Graft Acute MI Revascularization;  Surgeon: Lennette Bihari, MD;  Location: [facility];  Service: Cardiovascular;  Laterality: N/A;   FOOT SURGERY     LEFT HEART CATH AND CORONARY ANGIOGRAPHY N/A 03/10/2018   Procedure: LEFT HEART CATH AND CORONARY ANGIOGRAPHY;  Surgeon: Lennette Bihari, MD;  Location: [facility];  Service: Cardiovascular;  Laterality: N/A;   NO PAST SURGERIES      Social History   Socioeconomic History   Marital status: Single    Spouse name: Not on file   Number of children: Not on file   Years of education: Not on file   Highest education level: Not on file  Occupational History   Not on file  Tobacco Use   Smoking status: Every Day    Packs/day: 1    Types: Cigarettes   Smokeless tobacco: Never  Vaping Use   Vaping Use: Never used  Substance and Sexual Activity   Alcohol use: Yes    Alcohol/week: 6.0 standard drinks of alcohol    Types: 6 Cans of beer per week  Comment: 6 pack per day   Drug use: Yes    Types: Marijuana   Sexual activity: Not on file  Other Topics Concern   Not on file  Social History Narrative   ** Merged History Encounter **       Social Determinants of Health   Financial Resource Strain: High Risk (11/17/2020)   Overall Financial Resource Strain (CARDIA)    Difficulty of Paying Living Expenses: Very hard  Food Insecurity: Food Insecurity Present (11/17/2020)   Hunger Vital Sign    Worried About Running Out of Food in the Last Year: Sometimes true    Ran Out of Food in the Last Year: Sometimes true  Transportation Needs: Unmet Transportation Needs (11/17/2020)   PRAPARE - Administrator, Civil Service (Medical): Yes    Lack of Transportation  (Non-Medical): Yes  Physical Activity: Not on file  Stress: Not on file  Social Connections: Not on file  Intimate Partner Violence: Not on file    Family History  Problem Relation Age of Onset   Diabetes Mother    Hyperlipidemia Father    Hypertension Father    Coronary artery disease Father        Stents placed   Heart disease Maternal Grandmother    Heart disease Paternal Grandmother     ROS: no fevers or chills, productive cough, hemoptysis, dysphasia, odynophagia, melena, hematochezia, dysuria, hematuria, rash, seizure activity, orthopnea, PND, pedal edema, claudication. Remaining systems are negative.  Physical Exam: Well-developed well-nourished in no acute distress.  Skin is warm and dry.  HEENT is normal.  Neck is supple.  Chest is clear to auscultation with normal expansion.  Cardiovascular exam is regular rate and rhythm.  Abdominal exam nontender or distended. No masses palpated. Extremities show no edema. neuro grossly intact  ECG- personally reviewed  A/P  1 coronary artery disease-plan to continue aspirin and statin.  2 chest pressure-patient seen recently with atypical chest pressure.  Follow-up nuclear study showed no ischemia.  3 hyperlipidemia-continue statin.  4 tobacco abuse-patient again counseled on discontinuing.  5 history of renal mass-we have scheduled his MRI twice but it was not performed.  6 history of lower extremity edema-we we will continue Lasix as needed.  7 history of alcohol abuse-  Olga Millers, MD

## 2022-11-17 ENCOUNTER — Encounter: Payer: Self-pay | Admitting: Cardiology

## 2022-11-17 ENCOUNTER — Ambulatory Visit: Payer: Self-pay | Attending: Cardiology | Admitting: Cardiology

## 2022-11-30 ENCOUNTER — Other Ambulatory Visit: Payer: Self-pay

## 2022-11-30 ENCOUNTER — Other Ambulatory Visit: Payer: Self-pay | Admitting: Family

## 2022-11-30 MED ORDER — FUROSEMIDE 20 MG PO TABS
20.0000 mg | ORAL_TABLET | Freq: Every day | ORAL | 0 refills | Status: DC
  Filled 2022-11-30: qty 15, 15d supply, fill #0

## 2022-11-30 NOTE — Telephone Encounter (Signed)
Patient of Dr. Crenshaw. Please review for refill. Thank you!  

## 2022-12-01 ENCOUNTER — Other Ambulatory Visit: Payer: Self-pay

## 2023-01-21 ENCOUNTER — Other Ambulatory Visit: Payer: Self-pay | Admitting: Cardiology

## 2023-01-23 ENCOUNTER — Other Ambulatory Visit: Payer: Self-pay

## 2023-01-23 MED ORDER — FUROSEMIDE 20 MG PO TABS
20.0000 mg | ORAL_TABLET | Freq: Every day | ORAL | 0 refills | Status: DC
  Filled 2023-01-23: qty 15, 15d supply, fill #0

## 2023-01-26 ENCOUNTER — Other Ambulatory Visit: Payer: Self-pay

## 2023-02-17 ENCOUNTER — Other Ambulatory Visit: Payer: Self-pay | Admitting: Cardiology

## 2023-02-17 ENCOUNTER — Other Ambulatory Visit: Payer: Self-pay

## 2023-02-17 DIAGNOSIS — K219 Gastro-esophageal reflux disease without esophagitis: Secondary | ICD-10-CM

## 2023-02-17 MED ORDER — FUROSEMIDE 20 MG PO TABS
20.0000 mg | ORAL_TABLET | Freq: Every day | ORAL | 0 refills | Status: DC
  Filled 2023-02-17: qty 15, 15d supply, fill #0

## 2023-02-17 MED ORDER — OMEPRAZOLE 20 MG PO CPDR
20.0000 mg | DELAYED_RELEASE_CAPSULE | Freq: Two times a day (BID) | ORAL | 1 refills | Status: DC
Start: 2023-02-17 — End: 2023-10-17
  Filled 2023-02-17 – 2023-05-31 (×2): qty 90, 45d supply, fill #0
  Filled 2023-08-29: qty 90, 45d supply, fill #1

## 2023-03-27 ENCOUNTER — Other Ambulatory Visit: Payer: Self-pay

## 2023-03-27 ENCOUNTER — Other Ambulatory Visit: Payer: Self-pay | Admitting: Cardiology

## 2023-03-29 ENCOUNTER — Other Ambulatory Visit: Payer: Self-pay

## 2023-03-29 MED ORDER — FUROSEMIDE 20 MG PO TABS
20.0000 mg | ORAL_TABLET | Freq: Every day | ORAL | 3 refills | Status: DC
  Filled 2023-03-29: qty 30, 30d supply, fill #0
  Filled 2023-05-30 (×2): qty 30, 30d supply, fill #1
  Filled 2023-08-29: qty 30, 30d supply, fill #2
  Filled 2023-10-17: qty 30, 30d supply, fill #3

## 2023-03-31 ENCOUNTER — Other Ambulatory Visit: Payer: Self-pay

## 2023-04-06 ENCOUNTER — Other Ambulatory Visit: Payer: Self-pay

## 2023-05-30 ENCOUNTER — Other Ambulatory Visit: Payer: Self-pay

## 2023-05-31 ENCOUNTER — Other Ambulatory Visit (HOSPITAL_COMMUNITY): Payer: Self-pay

## 2023-05-31 ENCOUNTER — Other Ambulatory Visit: Payer: Self-pay

## 2023-08-29 ENCOUNTER — Other Ambulatory Visit: Payer: Self-pay | Admitting: Cardiology

## 2023-08-29 ENCOUNTER — Other Ambulatory Visit: Payer: Self-pay

## 2023-08-29 DIAGNOSIS — I10 Essential (primary) hypertension: Secondary | ICD-10-CM

## 2023-08-30 ENCOUNTER — Other Ambulatory Visit: Payer: Self-pay

## 2023-08-30 MED ORDER — METOPROLOL TARTRATE 25 MG PO TABS
12.5000 mg | ORAL_TABLET | Freq: Two times a day (BID) | ORAL | 0 refills | Status: DC
Start: 2023-08-30 — End: 2023-10-17
  Filled 2023-08-30: qty 60, 60d supply, fill #0

## 2023-08-30 MED ORDER — ATORVASTATIN CALCIUM 80 MG PO TABS
80.0000 mg | ORAL_TABLET | Freq: Every day | ORAL | 1 refills | Status: DC
  Filled 2023-08-30: qty 30, 30d supply, fill #0
  Filled 2023-10-17: qty 30, 30d supply, fill #1

## 2023-10-17 ENCOUNTER — Other Ambulatory Visit: Payer: Self-pay | Admitting: Cardiology

## 2023-10-17 ENCOUNTER — Other Ambulatory Visit: Payer: Self-pay

## 2023-10-17 DIAGNOSIS — I10 Essential (primary) hypertension: Secondary | ICD-10-CM

## 2023-10-17 DIAGNOSIS — K219 Gastro-esophageal reflux disease without esophagitis: Secondary | ICD-10-CM

## 2023-10-19 ENCOUNTER — Other Ambulatory Visit: Payer: Self-pay

## 2023-10-19 MED ORDER — OMEPRAZOLE 20 MG PO CPDR
20.0000 mg | DELAYED_RELEASE_CAPSULE | Freq: Two times a day (BID) | ORAL | 0 refills | Status: DC
Start: 2023-10-19 — End: 2023-12-15
  Filled 2023-10-19 – 2023-11-06 (×2): qty 30, 15d supply, fill #0

## 2023-10-19 MED ORDER — METOPROLOL TARTRATE 25 MG PO TABS
12.5000 mg | ORAL_TABLET | Freq: Two times a day (BID) | ORAL | 0 refills | Status: DC
Start: 2023-10-19 — End: 2023-12-11
  Filled 2023-10-19 – 2023-11-06 (×2): qty 30, 30d supply, fill #0

## 2023-10-30 ENCOUNTER — Other Ambulatory Visit: Payer: Self-pay

## 2023-11-06 ENCOUNTER — Other Ambulatory Visit: Payer: Self-pay

## 2023-12-11 ENCOUNTER — Other Ambulatory Visit: Payer: Self-pay

## 2023-12-11 ENCOUNTER — Telehealth: Payer: Self-pay | Admitting: Cardiology

## 2023-12-11 DIAGNOSIS — I10 Essential (primary) hypertension: Secondary | ICD-10-CM

## 2023-12-11 MED ORDER — METOPROLOL TARTRATE 25 MG PO TABS
12.5000 mg | ORAL_TABLET | Freq: Two times a day (BID) | ORAL | 0 refills | Status: DC
  Filled 2023-12-11: qty 90, 90d supply, fill #0

## 2023-12-11 MED ORDER — FUROSEMIDE 20 MG PO TABS
20.0000 mg | ORAL_TABLET | Freq: Every day | ORAL | 0 refills | Status: DC
  Filled 2023-12-11: qty 90, 90d supply, fill #0

## 2023-12-11 NOTE — Telephone Encounter (Signed)
 RX sent to requested Pharmacy

## 2023-12-11 NOTE — Telephone Encounter (Signed)
*  STAT* If patient is at the pharmacy, call can be transferred to refill team.   1. Which medications need to be refilled? (please list name of each medication and dose if known) atorvastatin  (LIPITOR ) 80 MG tablet Take 1 tablet (80 mg total) by mouth daily.   furosemide  (LASIX ) 20 MG tablet  Take 1 tablet (20 mg total) by mouth daily.  metoprolol  tartrate (LOPRESSOR ) 25 MG tablet  Take 0.5 tablets (12.5 mg total) by mouth 2 (two) times daily.   2. Would you like to learn more about the convenience, safety, & potential cost savings by using the [facility]? No    3. Are you open to using the [facility] No   4. Which pharmacy/location (including street and city if local pharmacy) is medication to be sent to?[facility]   5. Do they need a 30 day or 90 day supply? 90 Day Supply  Pt is scheduled for 03/18/24

## 2023-12-15 ENCOUNTER — Other Ambulatory Visit: Payer: Self-pay

## 2023-12-15 ENCOUNTER — Other Ambulatory Visit: Payer: Self-pay | Admitting: Cardiology

## 2023-12-15 DIAGNOSIS — K219 Gastro-esophageal reflux disease without esophagitis: Secondary | ICD-10-CM

## 2023-12-15 MED ORDER — ATORVASTATIN CALCIUM 80 MG PO TABS
80.0000 mg | ORAL_TABLET | Freq: Every day | ORAL | 0 refills | Status: DC
  Filled 2023-12-15: qty 90, 90d supply, fill #0

## 2023-12-15 MED ORDER — OMEPRAZOLE 20 MG PO CPDR
20.0000 mg | DELAYED_RELEASE_CAPSULE | Freq: Two times a day (BID) | ORAL | 0 refills | Status: DC
  Filled 2023-12-15: qty 180, 90d supply, fill #0

## 2024-03-07 NOTE — Progress Notes (Deleted)
 HPI: Follow-up coronary artery disease.  Cardiac catheterization 9/19 revealed an 80% mid circumflex followed by 99% lesion.  Patient had PCI with a drug-eluting stent.  LV function was normal.  Echocardiogram showed normal LV function, moderate left atrial enlargement and mild right ventricular enlargement.  In reviewing records there is a CT scan from January 2019 showed a 2.2 cm lesion in the left kidney suspicious for neoplasm.  Dedicated abdominal MRI was recommended.    Nuclear study January 2024 showed ejection fraction 59% and no ischemia.  Since last seen   Current Outpatient Medications  Medication Sig Dispense Refill   albuterol  (VENTOLIN  HFA) 108 (90 Base) MCG/ACT inhaler Inhale 2 puffs into the lungs every 6 (six) hours as needed for wheezing or shortness of breath. 18 g 5   aspirin  81 MG chewable tablet Chew 1 tablet (81 mg total) by mouth daily.     atorvastatin  (LIPITOR ) 80 MG tablet Take 1 tablet (80 mg total) by mouth daily. Pt needs to keep upcoming appt in Sept for further refills 90 tablet 0   furosemide  (LASIX ) 20 MG tablet Take 1 tablet (20 mg total) by mouth daily.Pt must keep upcoming followup appt with Cardiology in September 2025 for any more refills. Thank You 90 tablet 0   metoprolol  tartrate (LOPRESSOR ) 25 MG tablet Take 0.5 tablets (12.5 mg total) by mouth 2 (two) times daily.Pt must keep upcoming followup appt with Cardiology in September 2025 for any more refills. Thank You 90 tablet 0   naproxen  (NAPROSYN ) 375 MG tablet Take 1 tablet (375 mg total) by mouth 2 (two) times daily with a meal. 30 tablet 0   nitroGLYCERIN  (NITROSTAT ) 0.4 MG SL tablet For chest pain, tightness, or pressure. While sitting, place 1 tablet under tongue. May be used every 5 minutes as needed, for up to 15 minutes. Do not use more than 3 tablets. 25 tablet 12   omeprazole  (PRILOSEC) 20 MG capsule Take 1 capsule (20 mg total) by mouth 2 (two) times daily. Pt needs to keep upcoming appt in  Sept for further refills 180 capsule 0   No current facility-administered medications for this visit.     Past Medical History:  Diagnosis Date   Anxiety    Bipolar disorder (HCC)    CAD (coronary artery disease)    a. 03/11/18: acute STEMI s/p DES to LCx    Depression    Dyspnea    GERD (gastroesophageal reflux disease)     Past Surgical History:  Procedure Laterality Date   CORONARY/GRAFT ACUTE MI REVASCULARIZATION N/A 03/10/2018   Procedure: Coronary/Graft Acute MI Revascularization;  Surgeon: Burnard Debby LABOR, MD;  Location: [facility];  Service: Cardiovascular;  Laterality: N/A;   FOOT SURGERY     LEFT HEART CATH AND CORONARY ANGIOGRAPHY N/A 03/10/2018   Procedure: LEFT HEART CATH AND CORONARY ANGIOGRAPHY;  Surgeon: Burnard Debby LABOR, MD;  Location: [facility];  Service: Cardiovascular;  Laterality: N/A;   NO PAST SURGERIES      Social History   Socioeconomic History   Marital status: Single    Spouse name: Not on file   Number of children: Not on file   Years of education: Not on file   Highest education level: Not on file  Occupational History   Not on file  Tobacco Use   Smoking status: Every Day    Current packs/day: 1.00    Types: Cigarettes   Smokeless tobacco: Never  Vaping Use  Vaping status: Never Used  Substance and Sexual Activity   Alcohol use: Yes    Alcohol/week: 6.0 standard drinks of alcohol    Types: 6 Cans of beer per week    Comment: 6 pack per day   Drug use: Yes    Types: Marijuana   Sexual activity: Not on file  Other Topics Concern   Not on file  Social History Narrative   ** Merged History Encounter **       Social Drivers of Health   Financial Resource Strain: High Risk (11/17/2020)   Overall Financial Resource Strain (CARDIA)    Difficulty of Paying Living Expenses: Very hard  Food Insecurity: Food Insecurity Present (11/17/2020)   Hunger Vital Sign    Worried About Running Out of Food in the Last Year: Sometimes  true    Ran Out of Food in the Last Year: Sometimes true  Transportation Needs: Unmet Transportation Needs (11/17/2020)   PRAPARE - Administrator, Civil Service (Medical): Yes    Lack of Transportation (Non-Medical): Yes  Physical Activity: Not on file  Stress: Not on file  Social Connections: Not on file  Intimate Partner Violence: Not on file    Family History  Problem Relation Age of Onset   Diabetes Mother    Hyperlipidemia Father    Hypertension Father    Coronary artery disease Father        Stents placed   Heart disease Maternal Grandmother    Heart disease Paternal Grandmother     ROS: no fevers or chills, productive cough, hemoptysis, dysphasia, odynophagia, melena, hematochezia, dysuria, hematuria, rash, seizure activity, orthopnea, PND, pedal edema, claudication. Remaining systems are negative.  Physical Exam: Well-developed well-nourished in no acute distress.  Skin is warm and dry.  HEENT is normal.  Neck is supple.  Chest is clear to auscultation with normal expansion.  Cardiovascular exam is regular rate and rhythm.  Abdominal exam nontender or distended. No masses palpated. Extremities show no edema. neuro grossly intact  ECG- personally reviewed  A/P  1 coronary artery disease-patient denies chest pain.  Last nuclear study showed no ischemia.  Continue medical therapy with aspirin  and statin.  2 hyperlipidemia-continue statin.  3 tobacco abuse-patient counseled on discontinuing.  4 history of lower extremity edema-continue diuretic as needed.  5 history of left kidney mass suspicious for neoplasm-I previously ordered follow-up MRI on 2 separate occasions which were not performed.  I have asked him to follow-up with his primary care physician for this issue.  Redell Shallow, MD

## 2024-03-18 ENCOUNTER — Ambulatory Visit: Payer: Self-pay | Attending: Cardiology | Admitting: Cardiology

## 2024-05-24 ENCOUNTER — Other Ambulatory Visit: Payer: Self-pay | Admitting: Cardiology

## 2024-05-24 ENCOUNTER — Other Ambulatory Visit: Payer: Self-pay

## 2024-05-24 DIAGNOSIS — I10 Essential (primary) hypertension: Secondary | ICD-10-CM

## 2024-05-24 DIAGNOSIS — K219 Gastro-esophageal reflux disease without esophagitis: Secondary | ICD-10-CM

## 2024-05-28 ENCOUNTER — Other Ambulatory Visit: Payer: Self-pay

## 2024-05-29 ENCOUNTER — Other Ambulatory Visit: Payer: Self-pay

## 2024-05-29 MED ORDER — METOPROLOL TARTRATE 25 MG PO TABS
12.5000 mg | ORAL_TABLET | Freq: Two times a day (BID) | ORAL | 0 refills | Status: DC
  Filled 2024-05-29: qty 15, 15d supply, fill #0

## 2024-05-29 MED ORDER — ATORVASTATIN CALCIUM 80 MG PO TABS
80.0000 mg | ORAL_TABLET | Freq: Every day | ORAL | 0 refills | Status: DC
  Filled 2024-05-29: qty 15, 15d supply, fill #0

## 2024-05-29 MED ORDER — FUROSEMIDE 20 MG PO TABS
20.0000 mg | ORAL_TABLET | Freq: Every day | ORAL | 0 refills | Status: DC
  Filled 2024-05-29: qty 15, 15d supply, fill #0

## 2024-05-29 MED ORDER — OMEPRAZOLE 20 MG PO CPDR
20.0000 mg | DELAYED_RELEASE_CAPSULE | Freq: Two times a day (BID) | ORAL | 0 refills | Status: DC
  Filled 2024-05-29: qty 30, 15d supply, fill #0

## 2024-05-31 ENCOUNTER — Other Ambulatory Visit: Payer: Self-pay

## 2024-05-31 ENCOUNTER — Other Ambulatory Visit: Payer: Self-pay | Admitting: Cardiology

## 2024-05-31 DIAGNOSIS — K219 Gastro-esophageal reflux disease without esophagitis: Secondary | ICD-10-CM

## 2024-05-31 DIAGNOSIS — I10 Essential (primary) hypertension: Secondary | ICD-10-CM

## 2024-05-31 MED ORDER — ATORVASTATIN CALCIUM 80 MG PO TABS: 80.0000 mg | ORAL_TABLET | Freq: Every day | ORAL | 0 refills | Status: AC

## 2024-05-31 MED ORDER — METOPROLOL TARTRATE 25 MG PO TABS: 12.5000 mg | ORAL_TABLET | Freq: Two times a day (BID) | ORAL | 0 refills | Status: AC

## 2024-05-31 MED ORDER — OMEPRAZOLE 20 MG PO CPDR: 20.0000 mg | DELAYED_RELEASE_CAPSULE | Freq: Two times a day (BID) | ORAL | 0 refills | Status: AC

## 2024-05-31 MED ORDER — FUROSEMIDE 20 MG PO TABS: 20.0000 mg | ORAL_TABLET | Freq: Every day | ORAL | 0 refills | Status: AC

## 2024-06-23 NOTE — Progress Notes (Deleted)
 " Cardiology Office Note:    Date:  06/23/2024   ID:  Dustin ONEIDA Bluford Mickey., DOB 01-03-1971, MRN [id number]  PCP:  Freddrick Johns   [facility] Providers Cardiologist:  Redell Shallow, MD Cardiology APP:  Emelia Josefa HERO, NP { Click to update primary MD,subspecialty MD or APP then REFRESH:1}    Referring MD: No ref. provider found   No chief complaint on file. ***  History of Present Illness:    Dustin Luca. is a 54 y.o. male with a hx of CAD, hypertension, prior alcohol abuse, bipolar disorder, depression, hyperlipidemia, tobacco abuse.   He suffered a STEMI 03/11/2018 treated with DES-LCx using 2.5 x 28 DES.  He has mild nonobstructive disease in the RCA.  He continued to report chest pain and underwent nuclear stress test in 2020 that was nonischemic.  He was seen June 2022 with dyspnea on exertion and lower extremity edema.  He also reported intermittent chest pain.  Lexiscan  was ordered but not performed until January 2024 and was nonischemic.  He was last seen in clinic 07/04/2022 for edema.  As above, nuclear stress test was nonischemic.  He presents today for annual follow-up.      CAD STEMI s/p DES-LCx 2019 -Found to have tandem lesions in the mid LCx of 80% and 99% both treated with a single stent -- 28 mm of stent in his mid LCx - Reassuring nuclear stress test January 2024 - Remains on aspirin  monotherapy   Hyperlipidemia - Continue 80 mg Lipitor  -Last LDL was 66 in 2024 - Needs updated lipid panel - He follows this - Check LFTs, were mildly elevated January 2024   Prior lower extremity swelling - Has been treated with 20 mg Lasix  daily -Last echocardiogram following STEMI demonstrated preserved LVEF    Prior tobacco abuse -          Past Medical History:  Diagnosis Date   Anxiety    Bipolar disorder (HCC)    CAD (coronary artery disease)    a. 03/11/18: acute STEMI s/p DES to LCx    Depression    Dyspnea    GERD (gastroesophageal  reflux disease)     Past Surgical History:  Procedure Laterality Date   CORONARY/GRAFT ACUTE MI REVASCULARIZATION N/A 03/10/2018   Procedure: Coronary/Graft Acute MI Revascularization;  Surgeon: Burnard Debby LABOR, MD;  Location: [facility];  Service: Cardiovascular;  Laterality: N/A;   FOOT SURGERY     LEFT HEART CATH AND CORONARY ANGIOGRAPHY N/A 03/10/2018   Procedure: LEFT HEART CATH AND CORONARY ANGIOGRAPHY;  Surgeon: Burnard Debby LABOR, MD;  Location: [facility];  Service: Cardiovascular;  Laterality: N/A;   NO PAST SURGERIES      Current Medications: Active Medications[1]   Allergies:   Penicillins and Tramadol  hcl   Social History   Socioeconomic History   Marital status: Single    Spouse name: Not on file   Number of children: Not on file   Years of education: Not on file   Highest education level: Not on file  Occupational History   Not on file  Tobacco Use   Smoking status: Every Day    Current packs/day: 1.00    Types: Cigarettes   Smokeless tobacco: Never  Vaping Use   Vaping status: Never Used  Substance and Sexual Activity   Alcohol use: Yes    Alcohol/week: 6.0 standard drinks of alcohol    Types: 6 Cans of beer per week  Comment: 6 pack per day   Drug use: Yes    Types: Marijuana   Sexual activity: Not on file  Other Topics Concern   Not on file  Social History Narrative   ** Merged History Encounter **       Social Drivers of Health   Tobacco Use: High Risk (07/04/2022)   Patient History    Smoking Tobacco Use: Every Day    Smokeless Tobacco Use: Never    Passive Exposure: Not on file  Financial Resource Strain: Not on file  Food Insecurity: Not on file  Transportation Needs: Not on file  Physical Activity: Not on file  Stress: Not on file  Social Connections: Not on file  Depression (EYV7-0): Not on file  Alcohol Screen: Not on file  Housing: Not on file  Utilities: Not on file  Health Literacy: Not on file     Family  History: The patient's ***family history includes Coronary artery disease in his father; Diabetes in his mother; Heart disease in his maternal grandmother and paternal grandmother; Hyperlipidemia in his father; Hypertension in his father.  ROS:   Please see the history of present illness.    *** All other systems reviewed and are negative.  EKGs/Labs/Other Studies Reviewed:    The following studies were reviewed today: ***      Recent Labs: No results found for requested labs within last 365 days.  Recent Lipid Panel    Component Value Date/Time   CHOL 151 04/10/2019 0947   TRIG 64 04/10/2019 0947   HDL 48 04/10/2019 0947   CHOLHDL 3.1 04/10/2019 0947   CHOLHDL 3.0 03/10/2018 0609   VLDL 17 03/10/2018 0609   LDLCALC 90 04/10/2019 0947   LDLDIRECT 66 07/04/2022 1518     Risk Assessment/Calculations:   {Does this patient have ATRIAL FIBRILLATION?:[id number]}  No BP recorded.  {Refresh Note OR Click here to enter BP  :1}***         Physical Exam:    VS:  There were no vitals taken for this visit.    Wt Readings from Last 3 Encounters:  07/18/22 221 lb (100.2 kg)  07/04/22 221 lb 14.4 oz (100.7 kg)  12/09/20 247 lb (112 kg)     GEN: *** Well nourished, well developed in no acute distress HEENT: Normal NECK: No JVD; No carotid bruits LYMPHATICS: No lymphadenopathy CARDIAC: ***RRR, no murmurs, rubs, gallops RESPIRATORY:  Clear to auscultation without rales, wheezing or rhonchi  ABDOMEN: Soft, non-tender, non-distended MUSCULOSKELETAL:  No edema; No deformity  SKIN: Warm and dry NEUROLOGIC:  Alert and oriented x 3 PSYCHIATRIC:  Normal affect   ASSESSMENT:    No diagnosis found. PLAN:    In order of problems listed above:  ***      {Are you ordering a CV Procedure (e.g. stress test, cath, DCCV, TEE, etc)?   Press F2        :[id number]}    Medication Adjustments/Labs and Tests Ordered: Current medicines are reviewed at length with the patient today.   Concerns regarding medicines are outlined above.  No orders of the defined types were placed in this encounter.  No orders of the defined types were placed in this encounter.   There are no Patient Instructions on file for this visit.   Signed, Jon Nat Hails, [place]  06/23/2024 8:31 PM    [facility]    [1]  No outpatient medications have been marked as taking for the 06/24/24 encounter (Appointment) with Hails Jon  Nat, [place].   "

## 2024-06-24 ENCOUNTER — Ambulatory Visit: Payer: Self-pay | Admitting: Physician Assistant
